# Patient Record
Sex: Female | Born: 1945
Health system: Southern US, Community
[De-identification: ages and names within clinical notes are randomized; demographics above are authoritative.]

## PROBLEM LIST (undated history)

## (undated) DIAGNOSIS — G43909 Migraine, unspecified, not intractable, without status migrainosus: Secondary | ICD-10-CM

## (undated) DIAGNOSIS — R011 Cardiac murmur, unspecified: Secondary | ICD-10-CM

## (undated) DIAGNOSIS — C4491 Basal cell carcinoma of skin, unspecified: Secondary | ICD-10-CM

## (undated) DIAGNOSIS — L409 Psoriasis, unspecified: Secondary | ICD-10-CM

## (undated) DIAGNOSIS — R112 Nausea with vomiting, unspecified: Secondary | ICD-10-CM

## (undated) DIAGNOSIS — C439 Malignant melanoma of skin, unspecified: Secondary | ICD-10-CM

## (undated) DIAGNOSIS — K649 Unspecified hemorrhoids: Secondary | ICD-10-CM

## (undated) DIAGNOSIS — Z9889 Other specified postprocedural states: Secondary | ICD-10-CM

## (undated) HISTORY — PX: REFRACTIVE SURGERY: SHX103

## (undated) HISTORY — PX: OTHER SURGICAL HISTORY: SHX169

## (undated) HISTORY — DX: Unspecified hemorrhoids: K64.9

## (undated) HISTORY — DX: Nausea with vomiting, unspecified: Z98.890

## (undated) HISTORY — DX: Psoriasis, unspecified: L40.9

## (undated) HISTORY — PX: WISDOM TOOTH EXTRACTION: SHX21

## (undated) HISTORY — DX: Nausea with vomiting, unspecified: R11.2

## (undated) HISTORY — DX: Basal cell carcinoma of skin, unspecified: C44.91

## (undated) HISTORY — DX: Cardiac murmur, unspecified: R01.1

## (undated) HISTORY — DX: Malignant melanoma of skin, unspecified: C43.9

## (undated) HISTORY — DX: Migraine, unspecified, not intractable, without status migrainosus: G43.909

## (undated) HISTORY — PX: BUNIONECTOMY: SHX129

---

## 1982-12-27 HISTORY — PX: TUBAL LIGATION: SHX77

## 2002-10-04 ENCOUNTER — Other Ambulatory Visit: Admission: RE | Admit: 2002-10-04 | Discharge: 2002-10-04 | Payer: Self-pay | Admitting: Obstetrics and Gynecology

## 2003-12-19 ENCOUNTER — Other Ambulatory Visit: Admission: RE | Admit: 2003-12-19 | Discharge: 2003-12-19 | Payer: Self-pay | Admitting: Obstetrics and Gynecology

## 2003-12-28 DIAGNOSIS — C439 Malignant melanoma of skin, unspecified: Secondary | ICD-10-CM

## 2003-12-28 HISTORY — DX: Malignant melanoma of skin, unspecified: C43.9

## 2004-12-27 LAB — HM COLONOSCOPY: HM Colonoscopy: NORMAL

## 2005-01-12 ENCOUNTER — Other Ambulatory Visit: Admission: RE | Admit: 2005-01-12 | Discharge: 2005-01-12 | Payer: Self-pay | Admitting: *Deleted

## 2010-05-26 ENCOUNTER — Ambulatory Visit (HOSPITAL_COMMUNITY): Admission: RE | Admit: 2010-05-26 | Discharge: 2010-05-26 | Payer: Self-pay | Admitting: Obstetrics and Gynecology

## 2011-03-15 LAB — CBC
RBC: 3.93 MIL/uL (ref 3.87–5.11)
WBC: 4 10*3/uL (ref 4.0–10.5)

## 2012-01-06 ENCOUNTER — Telehealth: Payer: Self-pay | Admitting: Cardiology

## 2012-01-06 DIAGNOSIS — R42 Dizziness and giddiness: Secondary | ICD-10-CM

## 2012-01-06 NOTE — Telephone Encounter (Signed)
Left message

## 2012-01-06 NOTE — Telephone Encounter (Signed)
Tried to touch base with patient to find out symptoms.  Do you want to Rx something to Chelsea Barnett or wait until I speak to her ( have left messages)

## 2012-01-06 NOTE — Telephone Encounter (Signed)
New msg Pt wants medicine for dizziness please call to [facility]

## 2012-01-06 NOTE — Telephone Encounter (Signed)
FU Call: Pt returning call from Melinda. Please call back.  

## 2012-01-07 MED ORDER — MECLIZINE HCL 25 MG PO TABS: 25.0000 mg | ORAL_TABLET | Freq: Three times a day (TID) | ORAL | Status: AC | PRN

## 2012-01-07 NOTE — Telephone Encounter (Signed)
Try to speak with her and learn more about her symptoms.  If typical for vertigo okay to call in some Meclizine

## 2012-01-07 NOTE — Telephone Encounter (Signed)
Yes go ahead and call in meclizine 25 mg one tab q 6 h prn.# 20 refillx3

## 2012-01-07 NOTE — Telephone Encounter (Signed)
Pt has a inner ear something and she wants a refill on her medication because it ran out

## 2012-01-07 NOTE — Telephone Encounter (Signed)
Have called and left another message, do you want to call something in

## 2012-04-18 ENCOUNTER — Encounter: Payer: Self-pay | Admitting: *Deleted

## 2012-04-25 LAB — HM PAP SMEAR
HM Pap smear: NEGATIVE
HM Pap smear: NEGATIVE

## 2012-05-02 ENCOUNTER — Encounter: Payer: Self-pay | Admitting: Cardiology

## 2012-05-02 DIAGNOSIS — M899 Disorder of bone, unspecified: Secondary | ICD-10-CM | POA: Diagnosis not present

## 2012-05-02 DIAGNOSIS — Z1231 Encounter for screening mammogram for malignant neoplasm of breast: Secondary | ICD-10-CM | POA: Diagnosis not present

## 2012-05-02 DIAGNOSIS — M949 Disorder of cartilage, unspecified: Secondary | ICD-10-CM | POA: Diagnosis not present

## 2012-05-02 LAB — HM MAMMOGRAPHY: HM Mammogram: NEGATIVE

## 2012-05-23 ENCOUNTER — Other Ambulatory Visit: Payer: Self-pay | Admitting: Dermatology

## 2012-05-23 DIAGNOSIS — D485 Neoplasm of uncertain behavior of skin: Secondary | ICD-10-CM | POA: Diagnosis not present

## 2012-05-23 DIAGNOSIS — L578 Other skin changes due to chronic exposure to nonionizing radiation: Secondary | ICD-10-CM | POA: Diagnosis not present

## 2012-05-23 DIAGNOSIS — Z79899 Other long term (current) drug therapy: Secondary | ICD-10-CM | POA: Diagnosis not present

## 2012-05-23 DIAGNOSIS — L408 Other psoriasis: Secondary | ICD-10-CM | POA: Diagnosis not present

## 2012-05-23 DIAGNOSIS — L570 Actinic keratosis: Secondary | ICD-10-CM | POA: Diagnosis not present

## 2012-06-01 ENCOUNTER — Encounter: Payer: Self-pay | Admitting: Cardiology

## 2012-06-01 ENCOUNTER — Ambulatory Visit (INDEPENDENT_AMBULATORY_CARE_PROVIDER_SITE_OTHER): Payer: Medicare Other | Admitting: Cardiology

## 2012-06-01 VITALS — BP 118/76 | HR 56 | Ht 64.0 in | Wt 128.0 lb

## 2012-06-01 DIAGNOSIS — R634 Abnormal weight loss: Secondary | ICD-10-CM | POA: Diagnosis not present

## 2012-06-01 DIAGNOSIS — R0602 Shortness of breath: Secondary | ICD-10-CM | POA: Diagnosis not present

## 2012-06-01 DIAGNOSIS — E7800 Pure hypercholesterolemia, unspecified: Secondary | ICD-10-CM | POA: Diagnosis not present

## 2012-06-01 DIAGNOSIS — R609 Edema, unspecified: Secondary | ICD-10-CM | POA: Insufficient documentation

## 2012-06-01 DIAGNOSIS — L409 Psoriasis, unspecified: Secondary | ICD-10-CM | POA: Insufficient documentation

## 2012-06-01 DIAGNOSIS — L408 Other psoriasis: Secondary | ICD-10-CM

## 2012-06-01 NOTE — Assessment & Plan Note (Signed)
The patient has a past history of hypercholesterolemia with elevated LDL.  We are going to check her lipids today

## 2012-06-01 NOTE — Progress Notes (Signed)
Chelsea Barnett Date of Birth:  11/21/1946 [facility] [address] [place], [place]  [address] [phone]  Fax   [phone]  HPI: This pleasant 66 year old woman is seen for a office visit.  She was last seen in 2011.  She has been basically very healthy.  She does have a few concerns.  She has had some problems with her legs.  Her gynecologist suggested that she get some venous Dopplers because of varicose vein problems in the right leg with mild right leg swelling.  She has not had any acute pain to suggest deep vein thrombosis.  She has not been having any respiratory symptoms of pulmonary embolus.  The patient does have a history of psoriasis for the past 15 years and has been on methotrexate which is monitored by Dr. Homero Fellers used in her dermatologist.  The patient has a history of ovarian problems and had her ovaries removed 2 years ago.  Recently several months ago the patient went on a diet and lost 18 pounds on the GM diet.  She is no longer dieting but the weight has not come back on.  Current Outpatient Prescriptions  Medication Sig Dispense Refill  . aspirin 81 MG tablet Take 81 mg by mouth daily.      . methotrexate (RHEUMATREX) 2.5 MG tablet Take 2.5 mg by mouth as needed. Caution:Chemotherapy. Protect from light.      . Multiple Vitamins-Minerals (MULTIVITAMIN WITH MINERALS) tablet Take 1 tablet by mouth daily.      . Vitamin D, Ergocalciferol, (DRISDOL) 50000 UNITS CAPS Take 50,000 Units by mouth every 7 (seven) days.        No Known Allergies  Patient Active Problem List  Diagnoses  . Pure hypercholesterolemia  . Edema  . Weight loss    History  Smoking status  . Never Smoker   Smokeless tobacco  . Not on file    History  Alcohol Use No    Family History  Problem Relation Age of Onset  . Heart failure Father     Review of Systems: The patient denies any heat or cold intolerance.  No weight gain or weight loss.  The patient  denies headaches or blurry vision.  There is no cough or sputum production.  The patient denies dizziness.  There is no hematuria or hematochezia.  The patient denies any muscle aches or arthritis.  The patient denies any rash.  The patient denies frequent falling or instability.  There is no history of depression or anxiety.  All other systems were reviewed and are negative.   Physical Exam: Filed Vitals:   06/01/12 1436  BP: 118/76  Pulse: 56   the general appearance reveals a well-developed well-nourished woman in no distress.The head and neck exam reveals pupils equal and reactive.  Extraocular movements are full.  There is no scleral icterus.  The mouth and pharynx are normal.  The neck is supple.  The carotids reveal no bruits.  The jugular venous pressure is normal.  The  thyroid is not enlarged.  There is no lymphadenopathy.  The chest is clear to percussion and auscultation.  There are no rales or rhonchi.  Expansion of the chest is symmetrical.  The precordium is quiet.  The first heart sound is normal.  The second heart sound is physiologically split.  There is no murmur gallop rub or click.  There is no abnormal lift or heave.  The abdomen is soft and nontender.  The bowel sounds  are normal.  The liver and spleen are not enlarged.  There are no abdominal masses.  There are no abdominal bruits.  Extremities reveal good pedal pulses.  There is no phlebitis but she does have significant evidence of venous insufficiency in the right leg particularly and she does have mild right ankle edema.  There is no cyanosis or clubbing.  Strength is normal and symmetrical in all extremities.  There is no lateralizing weakness.  There are no sensory deficits.  The skin is warm and dry.    Her electrocardiogram today shows sinus bradycardia and no ischemic changes    Assessment / Plan: No new medications are prescribed.  We are checking lab work today.  We also did give him a chest x-ray.  The last x-ray we  have on file is from 3.  Rechecked in one year for followup office visit lipid panel hepatic function panel and basal metabolic panel

## 2012-06-01 NOTE — Assessment & Plan Note (Signed)
The patient has not had much regular activity.  She does have mild chronic edema of the right ankle associated with some evidence of venous insufficiency.  We will obtain venous Dopplers

## 2012-06-01 NOTE — Patient Instructions (Addendum)
Will obtain labs today and call you with the results   Your physician has requested that you have a lower or upper extremity venous duplex. This test is an ultrasound of the veins in the legs or arms. It looks at venous blood flow that carries blood from the heart to the legs or arms. Allow one hour for a Lower Venous exam. Allow thirty minutes for an Upper Venous exam. There are no restrictions or special instructions.  Will have you go to the [place] building across from [facility] between 8:30 and 4:30 for chest Xray  ur physician recommends that you continue on your current medications as directed. Please refer to the Current Medication list given to you today.  Your physician wants you to follow-up in: 1 year You will receive a reminder letter in the mail two months in advance. If you don't receive a letter, please call our office to schedule the follow-up appointment.

## 2012-06-02 LAB — BASIC METABOLIC PANEL
BUN: 10 mg/dL (ref 6–23)
Calcium: 9.6 mg/dL (ref 8.4–10.5)
GFR: 120.4 mL/min (ref >60.00)
Glucose, Bld: 77 mg/dL (ref 70–99)

## 2012-06-02 LAB — HEPATIC FUNCTION PANEL
ALT: 18 U/L (ref 0–35)
AST: 25 U/L (ref 0–37)
Alkaline Phosphatase: 65 U/L (ref 39–117)
Bilirubin, Direct: 0.1 mg/dL (ref 0.0–0.3)
Total Bilirubin: 0.9 mg/dL (ref 0.3–1.2)

## 2012-06-02 LAB — CBC WITH DIFFERENTIAL/PLATELET
Basophils Absolute: 0 10*3/uL (ref 0.0–0.1)
Eosinophils Absolute: 0 10*3/uL (ref 0.0–0.7)
Lymphocytes Relative: 29 % (ref 12.0–46.0)
MCHC: 32.6 g/dL (ref 30.0–36.0)
MCV: 97.4 fl (ref 78.0–100.0)
Monocytes Absolute: 0.3 10*3/uL (ref 0.1–1.0)
Neutro Abs: 2.8 10*3/uL (ref 1.4–7.7)
Neutrophils Relative %: 62.4 % (ref 43.0–77.0)
RDW: 14.7 % — ABNORMAL HIGH (ref 11.5–14.6)

## 2012-06-02 LAB — LIPID PANEL
HDL: 91 mg/dL (ref >39.00)
Triglycerides: 56 mg/dL (ref 0.0–149.0)

## 2012-06-04 NOTE — Progress Notes (Signed)
Quick Note:  Please report to patient. The recent labs are stable. Continue same medication and careful diet. Thyroid normal. Lipids satisfactory. HDL nice and high. ______

## 2012-06-06 ENCOUNTER — Telehealth: Payer: Self-pay | Admitting: *Deleted

## 2012-06-06 NOTE — Telephone Encounter (Signed)
Message copied by Burnell Blanks on Tue Jun 06, 2012  5:38 PM ------      Message from: Cassell Clement      Created: Sun Jun 04, 2012  8:36 AM       Please report to patient.  The recent labs are stable. Continue same medication and careful diet. Thyroid normal. Lipids satisfactory. HDL nice and high.

## 2012-06-06 NOTE — Telephone Encounter (Signed)
Advised of labs 

## 2012-06-12 ENCOUNTER — Encounter: Payer: Self-pay | Admitting: Cardiology

## 2012-06-19 ENCOUNTER — Encounter (INDEPENDENT_AMBULATORY_CARE_PROVIDER_SITE_OTHER): Payer: Medicare Other

## 2012-06-19 DIAGNOSIS — R609 Edema, unspecified: Secondary | ICD-10-CM | POA: Diagnosis not present

## 2012-10-13 DIAGNOSIS — Z85820 Personal history of malignant melanoma of skin: Secondary | ICD-10-CM | POA: Diagnosis not present

## 2012-10-13 DIAGNOSIS — L578 Other skin changes due to chronic exposure to nonionizing radiation: Secondary | ICD-10-CM | POA: Diagnosis not present

## 2012-10-13 DIAGNOSIS — L408 Other psoriasis: Secondary | ICD-10-CM | POA: Diagnosis not present

## 2012-10-13 DIAGNOSIS — Z79899 Other long term (current) drug therapy: Secondary | ICD-10-CM | POA: Diagnosis not present

## 2012-10-13 DIAGNOSIS — L819 Disorder of pigmentation, unspecified: Secondary | ICD-10-CM | POA: Diagnosis not present

## 2013-01-03 ENCOUNTER — Encounter: Payer: Self-pay | Admitting: Internal Medicine

## 2013-01-15 DIAGNOSIS — D239 Other benign neoplasm of skin, unspecified: Secondary | ICD-10-CM | POA: Diagnosis not present

## 2013-01-15 DIAGNOSIS — Z85820 Personal history of malignant melanoma of skin: Secondary | ICD-10-CM | POA: Diagnosis not present

## 2013-01-15 DIAGNOSIS — L578 Other skin changes due to chronic exposure to nonionizing radiation: Secondary | ICD-10-CM | POA: Diagnosis not present

## 2013-01-15 DIAGNOSIS — Z79899 Other long term (current) drug therapy: Secondary | ICD-10-CM | POA: Diagnosis not present

## 2013-01-15 DIAGNOSIS — L408 Other psoriasis: Secondary | ICD-10-CM | POA: Diagnosis not present

## 2013-01-15 DIAGNOSIS — L819 Disorder of pigmentation, unspecified: Secondary | ICD-10-CM | POA: Diagnosis not present

## 2013-01-15 DIAGNOSIS — L821 Other seborrheic keratosis: Secondary | ICD-10-CM | POA: Diagnosis not present

## 2013-04-16 DIAGNOSIS — Z85828 Personal history of other malignant neoplasm of skin: Secondary | ICD-10-CM | POA: Diagnosis not present

## 2013-04-16 DIAGNOSIS — L578 Other skin changes due to chronic exposure to nonionizing radiation: Secondary | ICD-10-CM | POA: Diagnosis not present

## 2013-04-16 DIAGNOSIS — L819 Disorder of pigmentation, unspecified: Secondary | ICD-10-CM | POA: Diagnosis not present

## 2013-04-16 DIAGNOSIS — Z85820 Personal history of malignant melanoma of skin: Secondary | ICD-10-CM | POA: Diagnosis not present

## 2013-04-16 DIAGNOSIS — L408 Other psoriasis: Secondary | ICD-10-CM | POA: Diagnosis not present

## 2013-04-16 DIAGNOSIS — L570 Actinic keratosis: Secondary | ICD-10-CM | POA: Diagnosis not present

## 2013-04-16 DIAGNOSIS — Z79899 Other long term (current) drug therapy: Secondary | ICD-10-CM | POA: Diagnosis not present

## 2013-04-23 ENCOUNTER — Encounter: Payer: Self-pay | Admitting: Certified Nurse Midwife

## 2013-04-26 ENCOUNTER — Encounter: Payer: Self-pay | Admitting: Certified Nurse Midwife

## 2013-04-26 ENCOUNTER — Ambulatory Visit (INDEPENDENT_AMBULATORY_CARE_PROVIDER_SITE_OTHER): Payer: Medicare Other | Admitting: Certified Nurse Midwife

## 2013-04-26 VITALS — BP 102/60 | Ht 64.25 in | Wt 128.0 lb

## 2013-04-26 DIAGNOSIS — Z01419 Encounter for gynecological examination (general) (routine) without abnormal findings: Secondary | ICD-10-CM | POA: Diagnosis not present

## 2013-04-26 DIAGNOSIS — Z124 Encounter for screening for malignant neoplasm of cervix: Secondary | ICD-10-CM

## 2013-04-26 DIAGNOSIS — N63 Unspecified lump in unspecified breast: Secondary | ICD-10-CM

## 2013-04-26 NOTE — Patient Instructions (Signed)

## 2013-04-26 NOTE — Progress Notes (Signed)
67 y.o. [id number] Married Caucasian Fe here for annual exam. Menopausal, denies any vaginal bleeding, or vaginal dryness.  No health issues today.  Sees PCP for aex , labs and medication management. Daughter Kirt Boys) passed bar exam!  Patient's last menstrual period was 12/28/1999.          Sexually active: yes  The current method of family planning is tubal ligation.    Exercising: yes  Home exercise routine includes walking & treadmill. Smoker:  no  Health Maintenance: Pap:  03-3012 neg HPV HR neg MMG:  05-02-12 neg Colonoscopy:  2006 BMD:   05-01-10 TDaP:  Not sure per patient Labs: none done  Self breast exams: occ   reports that she has never smoked. She does not have any smokeless tobacco history on file. She reports that she does not drink alcohol or use illicit drugs.  Past Medical History  Diagnosis Date  . Psoriasis   . Melanoma 2005  . Basal cell cancer   . Vaginal delivery     times 5  . Migraines     with aura    Past Surgical History  Procedure Laterality Date  . Melanoma removal      left upper arm  . Basal cell ca removal    . Fallopian tube removal      laparoscopic bso- fibroma  . Ovaries removed      laparoscopic bso- fibroma  . Refractive surgery    . Bunionectomy Right   . Tubal ligation  1984    Current Outpatient Prescriptions  Medication Sig Dispense Refill  . FOLIC ACID PO Take by mouth. occ      . methotrexate (RHEUMATREX) 2.5 MG tablet Take 2.5 mg by mouth as needed. Caution:Chemotherapy. Protect from light.      . Multiple Vitamins-Minerals (MULTIVITAMIN WITH MINERALS) tablet Take 1 tablet by mouth daily.      . Vitamin D, Ergocalciferol, (DRISDOL) 50000 UNITS CAPS Take 50,000 Units by mouth every 14 (fourteen) days.       Marland Kitchen aspirin 81 MG tablet Take 81 mg by mouth daily.       No current facility-administered medications for this visit.    Family History  Problem Relation Age of Onset  . Heart failure Father   . Heart disease Father    CVD  . Melanoma Mother   . Hypertension Mother   . Heart disease Mother     CVD  . Depression Sister   . Melanoma Sister   . Heart disease Brother   . Heart disease Sister     ROS:  Pertinent items are noted in HPI.  Otherwise, a comprehensive ROS was negative.  Exam:   BP 102/60  Ht 5' 4.25" (1.632 m)  Wt 128 lb (58.06 kg)  BMI 21.8 kg/m2  LMP 12/28/1999  Weight change: Height: 5' 4.25" (163.2 cm)  Ht Readings from Last 3 Encounters:  04/26/13 5' 4.25" (1.632 m)  06/01/12 5\' 4"  (1.626 m)    General appearance: alert, cooperative and appears stated age Head: Normocephalic, without obvious abnormality, atraumatic Neck: no adenopathy, supple, symmetrical, trachea midline and thyroid normal to inspection and palpation Lungs: clear to auscultation bilaterally Breasts:Left breast normal appearance, no masses or tenderness, positive findings: right breast at 10 o'clock 3 cm from nipple 1 cm mobile mass noted. no nipple discharge or skin change Heart: regular rate and rhythm Abdomen: soft, non-tender; no masses,  no organomegaly Extremities: extremities normal, atraumatic, no cyanosis or edema Skin: Skin color,  texture, turgor normal. No rashes or lesions Lymph nodes: Cervical, supraclavicular, and axillary nodes normal. No abnormal inguinal nodes palpated Neurologic: Grossly normal   Pelvic: External genitalia:  no lesions              Urethra:  normal appearing urethra with no masses, tenderness or lesions              Bartholin's and Skene's: normal                 Vagina: normal appearing vagina with normal color and discharge, no lesions              Cervix: normal              Pap taken: no Bimanual Exam:  Uterus:  normal size, contour, position, consistency, mobility, non-tender              Adnexa: normal adnexa and no mass, fullness, tenderness               Rectovaginal: Confirms               Anus:  normal sphincter tone, no lesions  A:  Well Woman with normal  exam  Menopausal no HRT  Right breast mass  P: Reviewed health and wellness pertinent to exam    Pap smear as per guidelines  Mammogram yearly Reviewed findings of right breast and need for diagnostic mammogram and possible u/s agreeable will order  return annually or prn  An After Visit Summary was printed and given to the patient.  Reviewed, TL

## 2013-04-27 ENCOUNTER — Telehealth: Payer: Self-pay | Admitting: *Deleted

## 2013-04-27 NOTE — Telephone Encounter (Signed)
Patient notified of need for diagnostic mammogram scheduled at [facility] for May 8th, 2014 @ 8:15am. Chart put on hold till report back for D,. Leonard,CNM

## 2013-05-03 DIAGNOSIS — N63 Unspecified lump in unspecified breast: Secondary | ICD-10-CM | POA: Diagnosis not present

## 2013-05-09 ENCOUNTER — Telehealth: Payer: Self-pay

## 2013-05-09 NOTE — Telephone Encounter (Signed)
Per Chelsea Barnett & Chelsea Barnett, patient needs a breast recheck in (around 8/14). Patients appt needs to be with Chelsea Barnett but it needs to be scheduled on a day that Chelsea Barnett is here so she can check it also. Schedule with DL. See mammogram result  Left message for patient to callback

## 2013-05-15 ENCOUNTER — Telehealth: Payer: Self-pay | Admitting: *Deleted

## 2013-05-15 NOTE — Telephone Encounter (Signed)
Patient notified of need to have follow up recheck of breast with Ortencia Kick in 3 month with Dr. Farrel Gobble here on that day also. Scheduled for 08/20/2013 @ 10:30 am. Chart on your desk. sue

## 2013-05-15 NOTE — Telephone Encounter (Signed)
Patient notifed per sue in triage & appt scheduled

## 2013-05-16 NOTE — Telephone Encounter (Signed)
Mammogram report on your desk to sign out of hold. Thanks sue

## 2013-05-16 NOTE — Telephone Encounter (Signed)
I don't think it needs to be in hold

## 2013-05-24 ENCOUNTER — Encounter: Payer: Self-pay | Admitting: Cardiology

## 2013-07-26 ENCOUNTER — Telehealth: Payer: Self-pay | Admitting: *Deleted

## 2013-07-26 NOTE — Telephone Encounter (Signed)
Faxed refill request received from pharmacy for VITAMIN D [id number] Last filled by MD on 04/25/12 X 1 YEAR Last AEX - 04/26/13 Next AEX - 05/02/14 Please advise refills. Thanks.

## 2013-07-26 NOTE — Telephone Encounter (Signed)
Chart in your office  

## 2013-07-26 NOTE — Telephone Encounter (Signed)
Need chart with last level

## 2013-07-26 NOTE — Telephone Encounter (Signed)
Notify patient her levels have been good and the recommendation now is OTC Vitamin D 3 1000 IU daily So no refill needed

## 2013-08-10 NOTE — Telephone Encounter (Signed)
Message left to return call.

## 2013-08-20 ENCOUNTER — Ambulatory Visit: Payer: Medicare Other | Admitting: Certified Nurse Midwife

## 2013-08-21 ENCOUNTER — Ambulatory Visit: Payer: Medicare Other | Admitting: Certified Nurse Midwife

## 2013-08-22 ENCOUNTER — Encounter: Payer: Self-pay | Admitting: Internal Medicine

## 2013-08-23 ENCOUNTER — Ambulatory Visit (INDEPENDENT_AMBULATORY_CARE_PROVIDER_SITE_OTHER): Payer: Medicare Other

## 2013-08-23 DIAGNOSIS — E7800 Pure hypercholesterolemia, unspecified: Secondary | ICD-10-CM

## 2013-08-24 DIAGNOSIS — Z79899 Other long term (current) drug therapy: Secondary | ICD-10-CM | POA: Diagnosis not present

## 2013-08-24 DIAGNOSIS — Z85820 Personal history of malignant melanoma of skin: Secondary | ICD-10-CM | POA: Diagnosis not present

## 2013-08-24 DIAGNOSIS — L821 Other seborrheic keratosis: Secondary | ICD-10-CM | POA: Diagnosis not present

## 2013-08-24 DIAGNOSIS — L408 Other psoriasis: Secondary | ICD-10-CM | POA: Diagnosis not present

## 2013-08-24 DIAGNOSIS — Z85828 Personal history of other malignant neoplasm of skin: Secondary | ICD-10-CM | POA: Diagnosis not present

## 2013-08-24 LAB — HEPATIC FUNCTION PANEL
AST: 22 U/L (ref 0–37)
Albumin: 4.1 g/dL (ref 3.5–5.2)
Total Bilirubin: 0.8 mg/dL (ref 0.3–1.2)

## 2013-08-24 LAB — LIPID PANEL
Cholesterol: 220 mg/dL — ABNORMAL HIGH (ref 0–200)
HDL: 98 mg/dL (ref >39.00)
Total CHOL/HDL Ratio: 2
Triglycerides: 46 mg/dL (ref 0.0–149.0)
VLDL: 9.2 mg/dL (ref 0.0–40.0)

## 2013-08-24 LAB — BASIC METABOLIC PANEL
BUN: 16 mg/dL (ref 6–23)
Calcium: 9.2 mg/dL (ref 8.4–10.5)
Chloride: 103 mEq/L (ref 96–112)
Creatinine, Ser: 0.6 mg/dL (ref 0.4–1.2)
GFR: 106.22 mL/min (ref >60.00)

## 2013-08-24 LAB — LDL CHOLESTEROL, DIRECT: Direct LDL: 112.2 mg/dL

## 2013-08-27 NOTE — Progress Notes (Signed)
Quick Note:  Please make copy of labs for patient visit. ______ 

## 2013-08-29 ENCOUNTER — Encounter: Payer: Self-pay | Admitting: Cardiology

## 2013-08-29 ENCOUNTER — Ambulatory Visit (INDEPENDENT_AMBULATORY_CARE_PROVIDER_SITE_OTHER): Payer: Medicare Other | Admitting: Cardiology

## 2013-08-29 VITALS — BP 112/78 | HR 71 | Ht 64.25 in | Wt 133.0 lb

## 2013-08-29 DIAGNOSIS — E7800 Pure hypercholesterolemia, unspecified: Secondary | ICD-10-CM | POA: Diagnosis not present

## 2013-08-29 DIAGNOSIS — R011 Cardiac murmur, unspecified: Secondary | ICD-10-CM | POA: Diagnosis not present

## 2013-08-29 NOTE — Progress Notes (Signed)
Chelsea Barnett Date of Birth:  February 15, 1946 [facility] [address] [place], [place]  [address] [phone]  Fax   [phone]  HPI: This pleasant 67 year old woman is seen for a office visit. She was last seen in 2013. She has been basically very healthy.  She does have a history of psoriasis.  Her former dermatologist Dr. Londell Moh has retired and she has a new dermatologist who has adjusted the dose of her methotrexate.  The patient has a history of varicose veins and venous insufficiency of the right leg with intermittent dependent edema.  She does not have any prior history of a known heart murmur.  She has a family history of atrial fibrillation in a sister and also her mother. The patient is physically active and walks regularly for exercise.   Current Outpatient Prescriptions  Medication Sig Dispense Refill  . aspirin 81 MG tablet Take 81 mg by mouth daily.      Marland Kitchen FOLIC ACID PO Take by mouth. occ      . methotrexate (RHEUMATREX) 2.5 MG tablet Take 2.5 mg by mouth once a week. Caution:Chemotherapy. Protect from light.      . Multiple Vitamins-Minerals (MULTIVITAMIN WITH MINERALS) tablet Take 1 tablet by mouth daily.      . Vitamin D, Ergocalciferol, (DRISDOL) 50000 UNITS CAPS Take 50,000 Units by mouth every 14 (fourteen) days.        No current facility-administered medications for this visit.    No Known Allergies  Patient Active Problem List   Diagnosis Date Noted  . Heart murmur, systolic 08/29/2013  . Pure hypercholesterolemia 06/01/2012  . Edema 06/01/2012  . Weight loss 06/01/2012  . Psoriasis 06/01/2012    History  Smoking status  . Never Smoker   Smokeless tobacco  . Not on file    History  Alcohol Use No    Family History  Problem Relation Age of Onset  . Heart failure Father   . Heart disease Father     CVD  . Melanoma Mother   . Hypertension Mother   . Heart disease Mother     CVD  . Depression Sister   . Melanoma  Sister   . Heart disease Brother   . Heart disease Sister     Review of Systems: The patient denies any heat or cold intolerance.  No weight gain or weight loss.  The patient denies headaches or blurry vision.  There is no cough or sputum production.  The patient denies dizziness.  There is no hematuria or hematochezia.  The patient denies any muscle aches or arthritis.  The patient denies any rash.  The patient denies frequent falling or instability.  There is no history of depression or anxiety.  All other systems were reviewed and are negative.   Physical Exam: Filed Vitals:   08/29/13 1500  BP: 112/78  Pulse: 71   the general appearance reveals a well-developed well-nourished middle-aged woman in no distress.The head and neck exam reveals pupils equal and reactive.  Extraocular movements are full.  There is no scleral icterus.  The mouth and pharynx are normal.  The neck is supple.  The carotids reveal no bruits.  The jugular venous pressure is normal.  The  thyroid is not enlarged.  There is no lymphadenopathy.  The chest is clear to percussion and auscultation.  There are no rales or rhonchi.  Expansion of the chest is symmetrical.  The precordium is quiet.  The first heart sound is  normal.  The second heart sound is physiologically split.  There is grade 2/6 systolic ejection murmur at the base.  There is no abnormal lift or heave.  The abdomen is soft and nontender.  The bowel sounds are normal.  The liver and spleen are not enlarged.  There are no abdominal masses.  There are no abdominal bruits.  Extremities reveal good pedal pulses.  There is no phlebitis or edema.  There is no cyanosis or clubbing.  Strength is normal and symmetrical in all extremities.  There is no lateralizing weakness.  There are no sensory deficits.  The skin is warm and dry.  There is no rash.      Assessment / Plan:   We will obtain a two-dimensional echocardiogram.  Return in one year for office visit EKG lipid  panel hepatic function panel and basal metabolic panel

## 2013-08-29 NOTE — Assessment & Plan Note (Signed)
Examination today reveals a soft systolic ejection murmur at the base suggesting an aortic outflow murmur.  We will obtain a two-dimensional echo

## 2013-08-29 NOTE — Patient Instructions (Signed)
Your physician recommends that you continue on your current medications as directed. Please refer to the Current Medication list given to you today.  Your physician wants you to follow-up in: 1 year ov/ekg/lp/bmet/hfp You will receive a reminder letter in the mail two months in advance. If you don't receive a letter, please call our office to schedule the follow-up appointment.  Your physician has requested that you have an echocardiogram. Echocardiography is a painless test that uses sound waves to create images of your heart. It provides your doctor with information about the size and shape of your heart and how well your heart's chambers and valves are working. This procedure takes approximately one hour. There are no restrictions for this procedure.

## 2013-08-29 NOTE — Assessment & Plan Note (Signed)
The patient has a history of high total cholesterol but her HDL level is extremely high and her risk ratio is 2.  She does not have to be on any statin therapy

## 2013-09-04 ENCOUNTER — Ambulatory Visit (HOSPITAL_COMMUNITY): Payer: Medicare Other | Attending: Cardiovascular Disease | Admitting: Radiology

## 2013-09-04 DIAGNOSIS — E785 Hyperlipidemia, unspecified: Secondary | ICD-10-CM | POA: Insufficient documentation

## 2013-09-04 DIAGNOSIS — R011 Cardiac murmur, unspecified: Secondary | ICD-10-CM

## 2013-09-04 DIAGNOSIS — I080 Rheumatic disorders of both mitral and aortic valves: Secondary | ICD-10-CM | POA: Insufficient documentation

## 2013-09-04 DIAGNOSIS — I079 Rheumatic tricuspid valve disease, unspecified: Secondary | ICD-10-CM | POA: Diagnosis not present

## 2013-09-04 NOTE — Progress Notes (Signed)
Echocardiogram performed.  

## 2013-09-10 ENCOUNTER — Telehealth: Payer: Self-pay | Admitting: *Deleted

## 2013-09-10 NOTE — Telephone Encounter (Signed)
Message copied by Burnell Blanks on Mon Sep 10, 2013  6:09 PM ------      Message from: Cassell Clement      Created: Wed Sep 05, 2013  5:31 AM       Please report.  Echo shows normal systolic function. Mild Aortic insufficiency and mild mitral insufficiency are the cause of her soft heart murmur. No drug treatment needed.  Continue to exercise and keep BP normal. ------

## 2013-09-10 NOTE — Telephone Encounter (Signed)
Advised patient

## 2013-11-19 ENCOUNTER — Ambulatory Visit (INDEPENDENT_AMBULATORY_CARE_PROVIDER_SITE_OTHER): Payer: Medicare Other | Admitting: Cardiology

## 2013-11-19 ENCOUNTER — Telehealth: Payer: Self-pay | Admitting: Cardiology

## 2013-11-19 ENCOUNTER — Encounter: Payer: Self-pay | Admitting: Cardiology

## 2013-11-19 VITALS — BP 142/80 | HR 69 | Ht 64.0 in | Wt 135.0 lb

## 2013-11-19 DIAGNOSIS — R609 Edema, unspecified: Secondary | ICD-10-CM | POA: Diagnosis not present

## 2013-11-19 DIAGNOSIS — R071 Chest pain on breathing: Secondary | ICD-10-CM | POA: Diagnosis not present

## 2013-11-19 NOTE — Telephone Encounter (Signed)
Follow up    Pt calling back about left back pain this started to 2 hrs ago.

## 2013-11-19 NOTE — Assessment & Plan Note (Signed)
The patient is not experiencing any significant peripheral edema

## 2013-11-19 NOTE — Assessment & Plan Note (Signed)
The patient presents with mild to moderate left-sided posterior chest discomfort worse on taking a deep breath.  She is not significantly dyspneic.  Her daughter thought she looked pale today.  She is not having any fever chills or night sweats or constitutional symptoms.  She has not had any recent swelling or pain in her legs. I suspect that this may be musculoskeletal chest wall pain but in view of her recent air travel we will get a CBC, basal metabolic panel, and a d-dimer.  It was too late in the afternoon to get a chest x-ray today so we will get one first thing in the morning.  In the meantime she will use ibuprofen on a when necessary basis.

## 2013-11-19 NOTE — Telephone Encounter (Signed)
Spoke with patient who c/o pain in her back under her left shoulder blade onset 2 hours ago.  Patient denies that pain radiates; denies jaw or arm pain.  Patient states the pain is worse when she takes a deep breath.  Patient denies SOB, nausea, fever, chills, coughing, sore throat, or ear pain.  Patient states she ate a greek salad for lunch, which is what she usually eats.  Patient questioned whether pain could be gas and I advised her to take a Tums and I will discuss with Dr. Patty Sermons who is in the office today. Patient denies that she has any questions about her medications.  Patient verbalized understanding and agreement with plan. I reviewed patient c/o with Dr. Patty Sermons whose advice is for patient to come here for an ekg and for an office visit.  I called patient to tell her his advice.  Patient verbalized agreement and understanding and is en route to the office.

## 2013-11-19 NOTE — Telephone Encounter (Signed)
New message     Question about medications

## 2013-11-19 NOTE — Progress Notes (Signed)
Chelsea Barnett Date of Birth:  1946/01/30 [address] [place], [place]  [address] [phone]  Fax   [phone]  HPI: This pleasant 67 year old woman is seen as a work in the office visit.  She comes in because of some left posterior pleuritic chest discomfort which started today.  There is no history of trauma.  2 weeks ago she had a long airplane trip to [place]. She has been basically very healthy.   The patient has a history of varicose veins and venous insufficiency of the right leg with intermittent dependent edema.  She does not have any prior history of a known heart murmur.  She had an echocardiogram in 09/04/13 which showed normal systolic function with ejection fraction 50-55% and grade 1 diastolic dysfunction.  There was mild aortic insufficiency and mild mitral regurgitation. She has a family history of atrial fibrillation in a sister and also her mother. The patient is physically active and walks regularly for exercise.   Current Outpatient Prescriptions  Medication Sig Dispense Refill  . aspirin 81 MG tablet Take 81 mg by mouth daily.      Marland Kitchen FOLIC ACID PO Take by mouth. occ      . methotrexate (RHEUMATREX) 2.5 MG tablet Take 6 tabs 1 day during the week -Pt takes 2 tabs three times a day on that one day      . Multiple Vitamins-Minerals (MULTIVITAMIN WITH MINERALS) tablet Take 1 tablet by mouth daily.      . Vitamin D, Ergocalciferol, (DRISDOL) 50000 UNITS CAPS Take 50,000 Units by mouth every 14 (fourteen) days.        No current facility-administered medications for this visit.    No Known Allergies  Patient Active Problem List   Diagnosis Date Noted  . Chest pain on breathing 11/19/2013  . Heart murmur, systolic 08/29/2013  . Pure hypercholesterolemia 06/01/2012  . Edema 06/01/2012  . Weight loss 06/01/2012  . Psoriasis 06/01/2012    History  Smoking status  . Never Smoker   Smokeless tobacco  . Not on file    History    Alcohol Use No    Family History  Problem Relation Age of Onset  . Heart failure Father   . Heart disease Father     CVD  . Melanoma Mother   . Hypertension Mother   . Heart disease Mother     CVD  . Depression Sister   . Melanoma Sister   . Heart disease Brother   . Heart disease Sister     Review of Systems: The patient denies any heat or cold intolerance.  No weight gain or weight loss.  The patient denies headaches or blurry vision.  There is no cough or sputum production.  The patient denies dizziness.  There is no hematuria or hematochezia.  The patient denies any muscle aches or arthritis.  The patient denies any rash.  The patient denies frequent falling or instability.  There is no history of depression or anxiety.  All other systems were reviewed and are negative.   Physical Exam: Filed Vitals:   11/19/13 1652  BP: 142/80  Pulse:    the general appearance reveals a well-developed well-nourished middle-aged woman in no distress.The head and neck exam reveals pupils equal and reactive.  Extraocular movements are full.  There is no scleral icterus.  The mouth and pharynx are normal.  The neck is supple.  The carotids reveal no bruits.  The jugular venous  pressure is normal.  The  thyroid is not enlarged.  There is no lymphadenopathy.  The chest is clear to percussion and auscultation.  There are no rales or rhonchi.  Expansion of the chest is symmetrical.  The precordium is quiet.  The first heart sound is normal.  The second heart sound is physiologically split.  There is grade 2/6 systolic ejection murmur at the base.  There is no abnormal lift or heave.  The abdomen is soft and nontender.  The bowel sounds are normal.  The liver and spleen are not enlarged.  There are no abdominal masses.  There are no abdominal bruits.  Extremities reveal good pedal pulses.  There is no phlebitis or edema.  There is no cyanosis or clubbing.  Strength is normal and symmetrical in all  extremities.  There is no lateralizing weakness.  There are no sensory deficits.  The skin is warm and dry.  There is no rash.  EKG today shows normal sinus rhythm with low-voltage QRS.  No significant change since 06/01/12    Assessment / Plan:  Suspect that this may be musculoskeletal chest wall pain with pleuritic component.  We will get baseline labs including a d-dimer today.  She will get a chest x-ray tomorrow morning since it is too late to get one today.  Recheck on a when necessary basis if symptoms fail to resolve.

## 2013-11-19 NOTE — Patient Instructions (Signed)
Will obtain labs today and call you with the results (cbc/bmet/d-dimer)  Would like for you to go to [facility] across from [facility] first thing in the morning (after 8:30) for chest xray  Call back if symptoms no better

## 2013-11-20 ENCOUNTER — Telehealth: Payer: Self-pay | Admitting: *Deleted

## 2013-11-20 ENCOUNTER — Ambulatory Visit (INDEPENDENT_AMBULATORY_CARE_PROVIDER_SITE_OTHER)
Admission: RE | Admit: 2013-11-20 | Discharge: 2013-11-20 | Disposition: A | Discharge status: Home / Self Care | Payer: Medicare Other | Source: Ambulatory Visit | Attending: Cardiology | Admitting: Cardiology

## 2013-11-20 DIAGNOSIS — R079 Chest pain, unspecified: Secondary | ICD-10-CM | POA: Diagnosis not present

## 2013-11-20 DIAGNOSIS — R071 Chest pain on breathing: Secondary | ICD-10-CM

## 2013-11-20 LAB — BASIC METABOLIC PANEL
Calcium: 9.8 mg/dL (ref 8.4–10.5)
Creatinine, Ser: 0.6 mg/dL (ref 0.4–1.2)
GFR: 106.14 mL/min (ref >60.00)
Sodium: 140 mEq/L (ref 135–145)

## 2013-11-20 LAB — CBC WITH DIFFERENTIAL/PLATELET
Basophils Relative: 0.5 % (ref 0.0–3.0)
Eosinophils Absolute: 0 10*3/uL (ref 0.0–0.7)
Hemoglobin: 11.9 g/dL — ABNORMAL LOW (ref 12.0–15.0)
Lymphocytes Relative: 13.2 % (ref 12.0–46.0)
MCHC: 33.3 g/dL (ref 30.0–36.0)
Monocytes Relative: 6.6 % (ref 3.0–12.0)
Neutro Abs: 6.5 10*3/uL (ref 1.4–7.7)
Neutrophils Relative %: 79.2 % — ABNORMAL HIGH (ref 43.0–77.0)
RBC: 3.96 Mil/uL (ref 3.87–5.11)
WBC: 8.3 10*3/uL (ref 4.5–10.5)

## 2013-11-20 NOTE — Telephone Encounter (Signed)
Message copied by Burnell Blanks on Tue Nov 20, 2013  3:12 PM ------      Message from: Cassell Clement      Created: Tue Nov 20, 2013  2:13 PM       Chest xray normal. Please report. ------

## 2013-11-20 NOTE — Progress Notes (Signed)
Quick Note:  Please report to patient. The recent labs are stable. Continue same medication and careful diet. ______ 

## 2013-11-20 NOTE — Telephone Encounter (Signed)
Advised patient

## 2013-11-20 NOTE — Telephone Encounter (Signed)
Message copied by Burnell Blanks on Tue Nov 20, 2013  3:12 PM ------      Message from: Cassell Clement      Created: Tue Nov 20, 2013  2:13 PM       Please report to patient.  The recent labs are stable. Continue same medication and careful diet. ------

## 2013-12-17 DIAGNOSIS — Z85828 Personal history of other malignant neoplasm of skin: Secondary | ICD-10-CM | POA: Diagnosis not present

## 2013-12-17 DIAGNOSIS — Z85820 Personal history of malignant melanoma of skin: Secondary | ICD-10-CM | POA: Diagnosis not present

## 2013-12-17 DIAGNOSIS — Z79899 Other long term (current) drug therapy: Secondary | ICD-10-CM | POA: Diagnosis not present

## 2013-12-17 DIAGNOSIS — L408 Other psoriasis: Secondary | ICD-10-CM | POA: Diagnosis not present

## 2014-02-08 DIAGNOSIS — M542 Cervicalgia: Secondary | ICD-10-CM | POA: Diagnosis not present

## 2014-02-08 DIAGNOSIS — M546 Pain in thoracic spine: Secondary | ICD-10-CM | POA: Diagnosis not present

## 2014-03-18 ENCOUNTER — Other Ambulatory Visit: Payer: Self-pay | Admitting: *Deleted

## 2014-03-18 DIAGNOSIS — D239 Other benign neoplasm of skin, unspecified: Secondary | ICD-10-CM | POA: Diagnosis not present

## 2014-03-18 DIAGNOSIS — Z85820 Personal history of malignant melanoma of skin: Secondary | ICD-10-CM | POA: Diagnosis not present

## 2014-03-18 DIAGNOSIS — L408 Other psoriasis: Secondary | ICD-10-CM | POA: Diagnosis not present

## 2014-03-18 DIAGNOSIS — Z85828 Personal history of other malignant neoplasm of skin: Secondary | ICD-10-CM | POA: Diagnosis not present

## 2014-03-18 MED ORDER — VITAMIN D (ERGOCALCIFEROL) 1.25 MG (50000 UNIT) PO CAPS: 50000.0000 [IU] | ORAL_CAPSULE | ORAL | Status: DC

## 2014-03-18 NOTE — Telephone Encounter (Addendum)
Fax from [facility] requesting Vitamin D 50,000  Last refilled: 04/25/12 #26 with refills x 1 year Last AEX: 04/26/13 no rx was given/sent Last Vitamin D level checked: 04/25/12 at 41 AEX Scheduled: 05/02/14 with Ms. Debbie   Given Vitamin D Protocol please advise.

## 2014-03-18 NOTE — Telephone Encounter (Signed)
I refilled until aex

## 2014-04-25 DIAGNOSIS — L408 Other psoriasis: Secondary | ICD-10-CM | POA: Diagnosis not present

## 2014-04-25 DIAGNOSIS — Z85820 Personal history of malignant melanoma of skin: Secondary | ICD-10-CM | POA: Diagnosis not present

## 2014-04-25 DIAGNOSIS — Z85828 Personal history of other malignant neoplasm of skin: Secondary | ICD-10-CM | POA: Diagnosis not present

## 2014-04-30 DIAGNOSIS — L408 Other psoriasis: Secondary | ICD-10-CM | POA: Diagnosis not present

## 2014-05-02 ENCOUNTER — Encounter: Payer: Self-pay | Admitting: Certified Nurse Midwife

## 2014-05-02 ENCOUNTER — Ambulatory Visit (INDEPENDENT_AMBULATORY_CARE_PROVIDER_SITE_OTHER): Payer: Medicare Other | Admitting: Certified Nurse Midwife

## 2014-05-02 VITALS — BP 124/70 | HR 68 | Resp 16 | Ht 64.0 in | Wt 134.0 lb

## 2014-05-02 DIAGNOSIS — Z01419 Encounter for gynecological examination (general) (routine) without abnormal findings: Secondary | ICD-10-CM

## 2014-05-02 DIAGNOSIS — Z0000 Encounter for general adult medical examination without abnormal findings: Secondary | ICD-10-CM

## 2014-05-02 DIAGNOSIS — L408 Other psoriasis: Secondary | ICD-10-CM | POA: Diagnosis not present

## 2014-05-02 NOTE — Progress Notes (Signed)
Reviewed personally.  M. Suzanne Krew Hortman, MD.  

## 2014-05-02 NOTE — Patient Instructions (Signed)

## 2014-05-02 NOTE — Progress Notes (Signed)
68 y.o. [id number] Married Caucasian Fe here for annual exam. Menopausal no HRT denies vaginal bleeding. Using coconut oil for vaginal dryness. Sees PCP yearly for labs, aex. Was told this year she had MVP slight, no treatment needed per cardiologist.. No other health issues today.  Patient's last menstrual period was 12/28/1999.          Sexually active: yes  The current method of family planning is tubal ligation.    Exercising: no  exercise Smoker:  no  Health Maintenance: Pap: 04-25-12 neg HPV HR neg MMG: 05-03-13 normal Colonoscopy:  2006 Will schedule with Cristal [facility] BMD:   2011 patient has scheduled TDaP:  Unsure, will check with MD Labs: none Self breast exam: done occ   reports that she has never smoked. She does not have any smokeless tobacco history on file. She reports that she does not drink alcohol or use illicit drugs.  Past Medical History  Diagnosis Date  . Psoriasis   . Melanoma 2005  . Basal cell cancer   . Vaginal delivery     times 5  . Migraines     with aura  . Heart murmur     Past Surgical History  Procedure Laterality Date  . Melanoma removal      left upper arm  . Basal cell ca removal    . Fallopian tube removal      laparoscopic bso- fibroma  . Ovaries removed      laparoscopic bso- fibroma  . Refractive surgery    . Bunionectomy Right   . Tubal ligation  1984    Current Outpatient Prescriptions  Medication Sig Dispense Refill  . FOLIC ACID PO Take by mouth. occ      . triamcinolone cream (KENALOG) 0.1 % daily.      . Vitamin D, Ergocalciferol, (DRISDOL) 50000 UNITS CAPS capsule Take 1 capsule (50,000 Units total) by mouth every 14 (fourteen) days.  30 capsule  0   No current facility-administered medications for this visit.    Family History  Problem Relation Age of Onset  . Heart failure Father   . Heart disease Father     CVD  . Melanoma Mother   . Hypertension Mother   . Heart disease Mother     CVD  . Depression Sister   .  Melanoma Sister   . Heart disease Brother   . Heart disease Sister     ROS:  Pertinent items are noted in HPI.  Otherwise, a comprehensive ROS was negative.  Exam:   BP 124/70  Pulse 68  Resp 16  Ht 5\' 4"  (1.626 m)  Wt 134 lb (60.782 kg)  BMI 22.99 kg/m2  LMP 12/28/1999 Height: 5\' 4"  (162.6 cm)  Ht Readings from Last 3 Encounters:  05/02/14 5\' 4"  (1.626 m)  11/19/13 5\' 4"  (1.626 m)  08/29/13 5' 4.25" (1.632 m)    General appearance: alert, cooperative and appears stated age Head: Normocephalic, without obvious abnormality, atraumatic Neck: no adenopathy, supple, symmetrical, trachea midline and thyroid normal to inspection and palpation and non-palpable Lungs: clear to auscultation bilaterally Breasts: normal appearance, no masses or tenderness, No nipple retraction or dimpling, No nipple discharge or bleeding, No axillary or supraclavicular adenopathy Heart: regular rate and rhythm, slight murmur heard only twice in sitting position, gr1/1V. Abdomen: soft, non-tender; no masses,  no organomegaly Extremities: extremities normal, atraumatic, no cyanosis or edema Skin: Skin color, texture, turgor normal. No rashes or lesions Lymph nodes: Cervical, supraclavicular, and axillary  nodes normal. No abnormal inguinal nodes palpated Neurologic: Grossly normal   Pelvic: External genitalia:  no lesions              Urethra:  normal appearing urethra with no masses, tenderness or lesions              Bartholin's and Skene's: normal                 Vagina: normal appearing vagina with normal color and discharge, no lesions              Cervix: normal, non tender              Pap taken: yes Bimanual Exam:  Uterus:  normal size, contour, position, consistency, mobility, non-tender and anteverted              Adnexa: normal adnexa and no mass, fullness, tenderness               Rectovaginal: Confirms               Anus:  normal sphincter tone, no lesions  A:  Well Woman with normal  exam  Menopausal no HRT  MVP under cardiology management  P:   Reviewed health and wellness pertinent to exam  Pap smear taken today  Mammogram yearly  Colonoscopy due 2016 patient to call to schedule  counseled on breast self exam, adequate intake of calcium and vitamin D, diet and exercise  return annually or prn  An After Visit Summary was printed and given to the patient.

## 2014-05-06 LAB — IPS PAP TEST WITH REFLEX TO HPV

## 2014-05-07 DIAGNOSIS — L408 Other psoriasis: Secondary | ICD-10-CM | POA: Diagnosis not present

## 2014-05-09 DIAGNOSIS — L408 Other psoriasis: Secondary | ICD-10-CM | POA: Diagnosis not present

## 2014-05-13 DIAGNOSIS — L408 Other psoriasis: Secondary | ICD-10-CM | POA: Diagnosis not present

## 2014-05-15 DIAGNOSIS — L408 Other psoriasis: Secondary | ICD-10-CM | POA: Diagnosis not present

## 2014-05-17 DIAGNOSIS — L408 Other psoriasis: Secondary | ICD-10-CM | POA: Diagnosis not present

## 2014-05-21 DIAGNOSIS — L408 Other psoriasis: Secondary | ICD-10-CM | POA: Diagnosis not present

## 2014-05-29 DIAGNOSIS — L408 Other psoriasis: Secondary | ICD-10-CM | POA: Diagnosis not present

## 2014-05-31 DIAGNOSIS — Z85820 Personal history of malignant melanoma of skin: Secondary | ICD-10-CM | POA: Diagnosis not present

## 2014-05-31 DIAGNOSIS — Z85828 Personal history of other malignant neoplasm of skin: Secondary | ICD-10-CM | POA: Diagnosis not present

## 2014-05-31 DIAGNOSIS — L408 Other psoriasis: Secondary | ICD-10-CM | POA: Diagnosis not present

## 2014-06-03 DIAGNOSIS — L408 Other psoriasis: Secondary | ICD-10-CM | POA: Diagnosis not present

## 2014-06-05 DIAGNOSIS — L408 Other psoriasis: Secondary | ICD-10-CM | POA: Diagnosis not present

## 2014-06-07 DIAGNOSIS — L408 Other psoriasis: Secondary | ICD-10-CM | POA: Diagnosis not present

## 2014-06-10 DIAGNOSIS — L408 Other psoriasis: Secondary | ICD-10-CM | POA: Diagnosis not present

## 2014-06-12 DIAGNOSIS — L408 Other psoriasis: Secondary | ICD-10-CM | POA: Diagnosis not present

## 2014-06-14 DIAGNOSIS — L408 Other psoriasis: Secondary | ICD-10-CM | POA: Diagnosis not present

## 2014-06-17 DIAGNOSIS — L408 Other psoriasis: Secondary | ICD-10-CM | POA: Diagnosis not present

## 2014-06-19 DIAGNOSIS — L408 Other psoriasis: Secondary | ICD-10-CM | POA: Diagnosis not present

## 2014-06-21 DIAGNOSIS — L408 Other psoriasis: Secondary | ICD-10-CM | POA: Diagnosis not present

## 2014-06-25 DIAGNOSIS — L408 Other psoriasis: Secondary | ICD-10-CM | POA: Diagnosis not present

## 2014-06-27 DIAGNOSIS — L408 Other psoriasis: Secondary | ICD-10-CM | POA: Diagnosis not present

## 2014-07-01 DIAGNOSIS — L408 Other psoriasis: Secondary | ICD-10-CM | POA: Diagnosis not present

## 2014-07-03 DIAGNOSIS — L408 Other psoriasis: Secondary | ICD-10-CM | POA: Diagnosis not present

## 2014-07-05 DIAGNOSIS — L408 Other psoriasis: Secondary | ICD-10-CM | POA: Diagnosis not present

## 2014-07-10 DIAGNOSIS — L408 Other psoriasis: Secondary | ICD-10-CM | POA: Diagnosis not present

## 2014-07-12 DIAGNOSIS — L408 Other psoriasis: Secondary | ICD-10-CM | POA: Diagnosis not present

## 2014-07-15 DIAGNOSIS — L408 Other psoriasis: Secondary | ICD-10-CM | POA: Diagnosis not present

## 2014-07-22 DIAGNOSIS — L408 Other psoriasis: Secondary | ICD-10-CM | POA: Diagnosis not present

## 2014-07-24 DIAGNOSIS — L408 Other psoriasis: Secondary | ICD-10-CM | POA: Diagnosis not present

## 2014-07-26 DIAGNOSIS — L408 Other psoriasis: Secondary | ICD-10-CM | POA: Diagnosis not present

## 2014-07-29 DIAGNOSIS — L408 Other psoriasis: Secondary | ICD-10-CM | POA: Diagnosis not present

## 2014-07-31 DIAGNOSIS — L408 Other psoriasis: Secondary | ICD-10-CM | POA: Diagnosis not present

## 2014-08-12 DIAGNOSIS — L408 Other psoriasis: Secondary | ICD-10-CM | POA: Diagnosis not present

## 2014-08-14 DIAGNOSIS — L408 Other psoriasis: Secondary | ICD-10-CM | POA: Diagnosis not present

## 2014-08-16 DIAGNOSIS — L408 Other psoriasis: Secondary | ICD-10-CM | POA: Diagnosis not present

## 2014-08-19 DIAGNOSIS — Z85828 Personal history of other malignant neoplasm of skin: Secondary | ICD-10-CM | POA: Diagnosis not present

## 2014-08-19 DIAGNOSIS — D239 Other benign neoplasm of skin, unspecified: Secondary | ICD-10-CM | POA: Diagnosis not present

## 2014-08-19 DIAGNOSIS — L570 Actinic keratosis: Secondary | ICD-10-CM | POA: Diagnosis not present

## 2014-08-19 DIAGNOSIS — D1801 Hemangioma of skin and subcutaneous tissue: Secondary | ICD-10-CM | POA: Diagnosis not present

## 2014-08-19 DIAGNOSIS — Z85820 Personal history of malignant melanoma of skin: Secondary | ICD-10-CM | POA: Diagnosis not present

## 2014-08-19 DIAGNOSIS — L408 Other psoriasis: Secondary | ICD-10-CM | POA: Diagnosis not present

## 2014-08-19 DIAGNOSIS — L98499 Non-pressure chronic ulcer of skin of other sites with unspecified severity: Secondary | ICD-10-CM | POA: Diagnosis not present

## 2014-08-20 DIAGNOSIS — L408 Other psoriasis: Secondary | ICD-10-CM | POA: Diagnosis not present

## 2014-08-22 ENCOUNTER — Other Ambulatory Visit: Payer: Self-pay

## 2014-08-22 DIAGNOSIS — L408 Other psoriasis: Secondary | ICD-10-CM | POA: Diagnosis not present

## 2014-08-22 MED ORDER — VITAMIN D (ERGOCALCIFEROL) 1.25 MG (50000 UNIT) PO CAPS: 50000.0000 [IU] | ORAL_CAPSULE | ORAL | Status: DC

## 2014-08-22 NOTE — Telephone Encounter (Signed)
Fax from [facility]: 05/02/14 Last refill:03/18/14 #30 X 0 Current AEX:05/06/15 Last Vitamin D level checked: 04/25/12 at 41   Please advise

## 2014-08-26 DIAGNOSIS — Z85820 Personal history of malignant melanoma of skin: Secondary | ICD-10-CM | POA: Diagnosis not present

## 2014-08-26 DIAGNOSIS — Z85828 Personal history of other malignant neoplasm of skin: Secondary | ICD-10-CM | POA: Diagnosis not present

## 2014-08-26 DIAGNOSIS — L821 Other seborrheic keratosis: Secondary | ICD-10-CM | POA: Diagnosis not present

## 2014-08-26 DIAGNOSIS — L408 Other psoriasis: Secondary | ICD-10-CM | POA: Diagnosis not present

## 2014-08-27 DIAGNOSIS — L408 Other psoriasis: Secondary | ICD-10-CM | POA: Diagnosis not present

## 2014-08-29 DIAGNOSIS — L408 Other psoriasis: Secondary | ICD-10-CM | POA: Diagnosis not present

## 2014-09-03 DIAGNOSIS — L408 Other psoriasis: Secondary | ICD-10-CM | POA: Diagnosis not present

## 2014-09-10 DIAGNOSIS — L408 Other psoriasis: Secondary | ICD-10-CM | POA: Diagnosis not present

## 2014-09-16 DIAGNOSIS — L408 Other psoriasis: Secondary | ICD-10-CM | POA: Diagnosis not present

## 2014-09-17 ENCOUNTER — Encounter: Payer: Self-pay | Admitting: Cardiology

## 2014-09-17 ENCOUNTER — Other Ambulatory Visit: Payer: Medicare Other

## 2014-09-17 ENCOUNTER — Ambulatory Visit (INDEPENDENT_AMBULATORY_CARE_PROVIDER_SITE_OTHER): Payer: Medicare Other | Admitting: Cardiology

## 2014-09-17 VITALS — BP 132/78 | HR 58 | Ht 64.0 in | Wt 134.0 lb

## 2014-09-17 DIAGNOSIS — R609 Edema, unspecified: Secondary | ICD-10-CM

## 2014-09-17 DIAGNOSIS — Z1231 Encounter for screening mammogram for malignant neoplasm of breast: Secondary | ICD-10-CM | POA: Diagnosis not present

## 2014-09-17 DIAGNOSIS — E7800 Pure hypercholesterolemia, unspecified: Secondary | ICD-10-CM

## 2014-09-17 DIAGNOSIS — R011 Cardiac murmur, unspecified: Secondary | ICD-10-CM

## 2014-09-17 DIAGNOSIS — R071 Chest pain on breathing: Secondary | ICD-10-CM | POA: Diagnosis not present

## 2014-09-17 NOTE — Assessment & Plan Note (Signed)
No further chest discomfort.  No pleurisy.  Exercise tolerance is good.

## 2014-09-17 NOTE — Progress Notes (Signed)
[place] Date of Birth:  09/28/1946 [facility] [address] [place], [place]  [address] [phone]  Fax   [phone]  HPI:  This pleasant 68 year old woman is seen for a scheduled followup office visit.  We last saw her in November 2014 because of some left posterior pleuritic chest discomfort which started  2 weeks after she had a long airplane trip to [place]. She has been basically very healthy. The patient has a history of varicose veins and venous insufficiency of the right leg with intermittent dependent edema. She does not have any prior history of a known heart murmur. She had an echocardiogram in 09/04/13 which showed normal systolic function with ejection fraction 50-55% and grade 1 diastolic dysfunction. There was mild aortic insufficiency and mild mitral regurgitation. She has a family history of atrial fibrillation in a sister and also her mother.  In November we obtained an EKG and a chest x-ray and a d-dimer all of which were normal and there was no evidence of a pulmonary embolus.  Since then the patient has been feeling well.  She walks in her neighborhood for exercise.    Current Outpatient Prescriptions  Medication Sig Dispense Refill  . FLUOCINOLONE ACETONIDE SCALP 0.01 % OIL       . FOLIC ACID PO Take by mouth. occ      . Vitamin D, Ergocalciferol, (DRISDOL) 50000 UNITS CAPS capsule Take 1 capsule (50,000 Units total) by mouth every 14 (fourteen) days.  30 capsule  0  . triamcinolone cream (KENALOG) 0.1 % daily.       No current facility-administered medications for this visit.    No Known Allergies  Patient Active Problem List   Diagnosis Date Noted  . Chest pain on breathing 11/19/2013  . Heart murmur, systolic 79/01/4096  . Pure hypercholesterolemia 06/01/2012  . Edema 06/01/2012  . Weight loss 06/01/2012  . Psoriasis 06/01/2012    History  Smoking status  . Never Smoker   Smokeless tobacco  . Not on file     History  Alcohol Use No    Family History  Problem Relation Age of Onset  . Heart failure Father   . Heart disease Father     CVD  . Melanoma Mother   . Hypertension Mother   . Heart disease Mother     CVD  . Depression Sister   . Melanoma Sister   . Heart disease Brother   . Heart disease Sister     Review of Systems: The patient denies any heat or cold intolerance.  No weight gain or weight loss.  The patient denies headaches or blurry vision.  There is no cough or sputum production.  The patient denies dizziness.  There is no hematuria or hematochezia.  The patient denies any muscle aches or arthritis.  The patient denies any rash.  The patient denies frequent falling or instability.  There is no history of depression or anxiety.  All other systems were reviewed and are negative.   Physical Exam: Filed Vitals:   09/17/14 0852  BP: 132/78  Pulse: 58  The patient appears to be in no distress.  Head and neck exam reveals that the pupils are equal and reactive.  The extraocular movements are full.  There is no scleral icterus.  Mouth and pharynx are benign.  No lymphadenopathy.  No carotid bruits.  The jugular venous pressure is normal.  Thyroid is not enlarged or tender.  Chest  is clear to percussion and auscultation.  No rales or rhonchi.  Expansion of the chest is symmetrical.  Heart reveals no abnormal lift or heave.  First and second heart sounds are normal.  There is grade 1/6 systolic ejection murmur at the base.  The abdomen is soft and nontender.  Bowel sounds are normoactive.  There is no hepatosplenomegaly or mass.  There are no abdominal bruits.  Extremities reveal no phlebitis or edema.  Pedal pulses are good.  There is no cyanosis or clubbing.  Neurologic exam is normal strength and no lateralizing weakness.  No sensory deficits.  Integument reveals rash consistent with psoriasis.   EKG shows sinus bradycardia and no ischemic changes.  Since last  tracing 11/19/13, no significant change.  Assessment / Plan: 1. systolic heart murmur with previous echocardiogram September 2014 showing mild aortic insufficiency and mild mitral regurgitation. 2. Hypercholesterolemia 3. psoriasis followed by Dr. Amy Martinique.  Presently she is using a light box therapy and she is off methotrexate.  Disposition: Continue current medication.  Await results of today's labs.  Recheck in 6 months for office visit.  Continue walking for exercise.

## 2014-09-17 NOTE — Assessment & Plan Note (Signed)
The patient has a history of hypercholesterolemia.  She is not currently on statin.  We're getting fasting lab work today.

## 2014-09-17 NOTE — Patient Instructions (Signed)
Will obtain labs today and call you with the results (lp/bmet/hfp/cbc)  Your physician recommends that you continue on your current medications as directed. Please refer to the Current Medication list given to you today.  Your physician wants you to follow-up in: 6 month ov You will receive a reminder letter in the mail two months in advance. If you don't receive a letter, please call our office to schedule the follow-up appointment.

## 2014-09-17 NOTE — Assessment & Plan Note (Signed)
The patient is not experiencing any pitting edema

## 2014-09-18 LAB — LIPID PANEL
CHOL/HDL RATIO: 3
CHOLESTEROL: 229 mg/dL — AB (ref 0–200)
HDL: 87.2 mg/dL (ref >39.00)
LDL Cholesterol: 125 mg/dL — ABNORMAL HIGH (ref 0–99)
NonHDL: 141.8
TRIGLYCERIDES: 86 mg/dL (ref 0.0–149.0)
VLDL: 17.2 mg/dL (ref 0.0–40.0)

## 2014-09-18 LAB — BASIC METABOLIC PANEL
BUN: 18 mg/dL (ref 6–23)
CALCIUM: 9.4 mg/dL (ref 8.4–10.5)
CO2: 32 meq/L (ref 19–32)
CREATININE: 0.9 mg/dL (ref 0.4–1.2)
Chloride: 104 mEq/L (ref 96–112)
GFR: 68.95 mL/min (ref >60.00)
Glucose, Bld: 96 mg/dL (ref 70–99)
Potassium: 4.7 mEq/L (ref 3.5–5.1)
SODIUM: 139 meq/L (ref 135–145)

## 2014-09-18 LAB — CBC WITH DIFFERENTIAL/PLATELET
BASOS ABS: 0 10*3/uL (ref 0.0–0.1)
BASOS PCT: 0.7 % (ref 0.0–3.0)
EOS ABS: 0.1 10*3/uL (ref 0.0–0.7)
Eosinophils Relative: 1.9 % (ref 0.0–5.0)
HCT: 41.2 % (ref 36.0–46.0)
HEMOGLOBIN: 13.6 g/dL (ref 12.0–15.0)
LYMPHS ABS: 1.6 10*3/uL (ref 0.7–4.0)
Lymphocytes Relative: 38.3 % (ref 12.0–46.0)
MCHC: 33.1 g/dL (ref 30.0–36.0)
MCV: 92.4 fl (ref 78.0–100.0)
MONO ABS: 0.5 10*3/uL (ref 0.1–1.0)
Monocytes Relative: 10.8 % (ref 3.0–12.0)
NEUTROS ABS: 2.1 10*3/uL (ref 1.4–7.7)
Neutrophils Relative %: 48.3 % (ref 43.0–77.0)
Platelets: 229 10*3/uL (ref 150.0–400.0)
RBC: 4.46 Mil/uL (ref 3.87–5.11)
RDW: 13.7 % (ref 11.5–15.5)
WBC: 4.3 10*3/uL (ref 4.0–10.5)

## 2014-09-18 LAB — HEPATIC FUNCTION PANEL
ALBUMIN: 4.3 g/dL (ref 3.5–5.2)
ALK PHOS: 68 U/L (ref 39–117)
ALT: 12 U/L (ref 0–35)
AST: 21 U/L (ref 0–37)
Bilirubin, Direct: 0 mg/dL (ref 0.0–0.3)
TOTAL PROTEIN: 7 g/dL (ref 6.0–8.3)
Total Bilirubin: 0.6 mg/dL (ref 0.2–1.2)

## 2014-09-18 NOTE — Progress Notes (Signed)
Quick Note:  Please report to patient. The recent labs are stable. Continue same medication and careful diet. Cholesterol slightly elevated. Work harder on diet. Continue exercise. ______

## 2014-09-23 DIAGNOSIS — L408 Other psoriasis: Secondary | ICD-10-CM | POA: Diagnosis not present

## 2014-09-30 DIAGNOSIS — L400 Psoriasis vulgaris: Secondary | ICD-10-CM | POA: Diagnosis not present

## 2014-10-07 DIAGNOSIS — L400 Psoriasis vulgaris: Secondary | ICD-10-CM | POA: Diagnosis not present

## 2014-10-16 DIAGNOSIS — L400 Psoriasis vulgaris: Secondary | ICD-10-CM | POA: Diagnosis not present

## 2014-10-28 ENCOUNTER — Encounter: Payer: Self-pay | Admitting: Cardiology

## 2014-11-06 DIAGNOSIS — L400 Psoriasis vulgaris: Secondary | ICD-10-CM | POA: Diagnosis not present

## 2014-11-12 DIAGNOSIS — L400 Psoriasis vulgaris: Secondary | ICD-10-CM | POA: Diagnosis not present

## 2014-11-18 DIAGNOSIS — L400 Psoriasis vulgaris: Secondary | ICD-10-CM | POA: Diagnosis not present

## 2014-11-26 DIAGNOSIS — L400 Psoriasis vulgaris: Secondary | ICD-10-CM | POA: Diagnosis not present

## 2014-12-02 DIAGNOSIS — L400 Psoriasis vulgaris: Secondary | ICD-10-CM | POA: Diagnosis not present

## 2014-12-03 DIAGNOSIS — Z85820 Personal history of malignant melanoma of skin: Secondary | ICD-10-CM | POA: Diagnosis not present

## 2014-12-03 DIAGNOSIS — L400 Psoriasis vulgaris: Secondary | ICD-10-CM | POA: Diagnosis not present

## 2014-12-03 DIAGNOSIS — L0100 Impetigo, unspecified: Secondary | ICD-10-CM | POA: Diagnosis not present

## 2014-12-03 DIAGNOSIS — Z85828 Personal history of other malignant neoplasm of skin: Secondary | ICD-10-CM | POA: Diagnosis not present

## 2014-12-10 DIAGNOSIS — L400 Psoriasis vulgaris: Secondary | ICD-10-CM | POA: Diagnosis not present

## 2015-02-24 DIAGNOSIS — Z85820 Personal history of malignant melanoma of skin: Secondary | ICD-10-CM | POA: Diagnosis not present

## 2015-02-24 DIAGNOSIS — L400 Psoriasis vulgaris: Secondary | ICD-10-CM | POA: Diagnosis not present

## 2015-02-24 DIAGNOSIS — Z85828 Personal history of other malignant neoplasm of skin: Secondary | ICD-10-CM | POA: Diagnosis not present

## 2015-04-03 ENCOUNTER — Ambulatory Visit (INDEPENDENT_AMBULATORY_CARE_PROVIDER_SITE_OTHER): Payer: Medicare Other | Admitting: Cardiology

## 2015-04-03 ENCOUNTER — Encounter: Payer: Self-pay | Admitting: Cardiology

## 2015-04-03 DIAGNOSIS — R011 Cardiac murmur, unspecified: Secondary | ICD-10-CM

## 2015-04-03 DIAGNOSIS — E7800 Pure hypercholesterolemia, unspecified: Secondary | ICD-10-CM

## 2015-04-03 DIAGNOSIS — I10 Essential (primary) hypertension: Secondary | ICD-10-CM | POA: Diagnosis not present

## 2015-04-03 LAB — HEPATIC FUNCTION PANEL
ALBUMIN: 4.3 g/dL (ref 3.5–5.2)
ALT: 10 U/L (ref 0–35)
AST: 18 U/L (ref 0–37)
Alkaline Phosphatase: 79 U/L (ref 39–117)
Bilirubin, Direct: 0.1 mg/dL (ref 0.0–0.3)
Total Bilirubin: 0.5 mg/dL (ref 0.2–1.2)
Total Protein: 7.2 g/dL (ref 6.0–8.3)

## 2015-04-03 LAB — LIPID PANEL
Cholesterol: 189 mg/dL (ref 0–200)
HDL: 81 mg/dL (ref >39.00)
LDL CALC: 95 mg/dL (ref 0–99)
NONHDL: 108
Total CHOL/HDL Ratio: 2
Triglycerides: 65 mg/dL (ref 0.0–149.0)
VLDL: 13 mg/dL (ref 0.0–40.0)

## 2015-04-03 LAB — BASIC METABOLIC PANEL
BUN: 12 mg/dL (ref 6–23)
CO2: 29 meq/L (ref 19–32)
Calcium: 10 mg/dL (ref 8.4–10.5)
Chloride: 102 mEq/L (ref 96–112)
Creatinine, Ser: 0.64 mg/dL (ref 0.40–1.20)
GFR: 98.11 mL/min (ref >60.00)
GLUCOSE: 88 mg/dL (ref 70–99)
Potassium: 4 mEq/L (ref 3.5–5.1)
SODIUM: 137 meq/L (ref 135–145)

## 2015-04-03 NOTE — Progress Notes (Signed)
Cardiology Office Note   Date:  04/03/2015   ID:  Chelsea Barnett, DOB 23-Aug-1946, MRN [id number]  PCP:  No PCP Per Patient  Cardiologist:   Darlin Coco, MD   Chief Complaint  Patient presents with  . Follow-up    6 MONTH      History of Present Illness: Chelsea Barnett is a 69 y.o. female who presents for a six-month follow-up office visit. This pleasant 69 year old woman is seen for a scheduled followup office visit. We  saw her in November 2014 because of some left posterior pleuritic chest discomfort which started 2 weeks after she had a long airplane trip to [place]. She has been basically very healthy. The patient has a history of varicose veins and venous insufficiency of the right leg with intermittent dependent edema. She does not have any prior history of a known heart murmur. She had an echocardiogram in 09/04/13 which showed normal systolic function with ejection fraction 50-55% and grade 1 diastolic dysfunction. There was mild aortic insufficiency and mild mitral regurgitation. She has a family history of atrial fibrillation in a sister and also her mother. In November 2014 we obtained an EKG and a chest x-ray and a d-dimer all of which were normal and there was no evidence of a pulmonary embolus. Since then the patient has been feeling well. She walks in her neighborhood for exercise. Today her blood pressure was elevated on arrival.  I rechecked it and come down to 150/82.  She has not been having any shortness of breath or chest pain.  No palpitations dizziness or syncope. She has been having some arthritic swelling and discomfort over right thumb and right hand and she will see Dr. Amedeo Plenty. She is on new medication for her psoriasis, [place] Nail, which appears to be helping her and working well so far.  Past Medical History  Diagnosis Date  . Psoriasis   . Melanoma 2005  . Basal cell cancer   . Vaginal delivery     times 5  . Migraines     with  aura  . Heart murmur     Past Surgical History  Procedure Laterality Date  . Melanoma removal      left upper arm  . Basal cell ca removal    . Fallopian tube removal      laparoscopic bso- fibroma  . Ovaries removed      laparoscopic bso- fibroma  . Refractive surgery    . Bunionectomy Right   . Tubal ligation  1984     Current Outpatient Prescriptions  Medication Sig Dispense Refill  . Apremilast (OTEZLA PO) Take 1 tablet by mouth daily.    Marland Kitchen FLUOCINOLONE ACETONIDE SCALP 0.01 % OIL     . FOLIC ACID PO Take 1 tablet by mouth daily. occ    . triamcinolone cream (KENALOG) 0.1 % daily.     No current facility-administered medications for this visit.    Allergies:   Review of patient's allergies indicates no known allergies.    Social History:  The patient  reports that she has never smoked. She does not have any smokeless tobacco history on file. She reports that she does not drink alcohol or use illicit drugs.   Family History:  The patient's family history includes Depression in her sister; Heart disease in her brother, father, mother, and sister; Heart failure in her father; Hypertension in her mother; Melanoma in her mother and sister.    ROS:  Please  see the history of present illness.   Otherwise, review of systems are positive for none.   All other systems are reviewed and negative.    PHYSICAL EXAM: VS:  BP 154/92 mmHg  Pulse 79  Ht 5\' 4"  (1.626 m)  Wt 132 lb (59.875 kg)  BMI 22.65 kg/m2  SpO2 99%  LMP 12/28/1999 , BMI Body mass index is 22.65 kg/(m^2). GEN: Well nourished, well developed, in no acute distress HEENT: normal Neck: no JVD, carotid bruits, or masses Cardiac: RRR; grade 2/6 systolic ejection murmur at base.  No diastolic murmur heard Respiratory:  clear to auscultation bilaterally, normal work of breathing GI: soft, nontender, nondistended, + BS MS: no deformity or atrophy.  Her right thumb is swollen when compared with the left Skin: warm and  dry, no rash Neuro:  Strength and sensation are intact Psych: euthymic mood, full affect   EKG:  EKG is not ordered today.    Recent Labs: 09/17/2014: Hemoglobin 13.6; Platelets 229.0 04/03/2015: ALT 10; BUN 12; Creatinine 0.64; Potassium 4.0; Sodium 137    Lipid Panel    Component Value Date/Time   CHOL 189 04/03/2015 1058   TRIG 65.0 04/03/2015 1058   HDL 81.00 04/03/2015 1058   CHOLHDL 2 04/03/2015 1058   VLDL 13.0 04/03/2015 1058   LDLCALC 95 04/03/2015 1058   LDLDIRECT 112.2 08/23/2013 0822      Wt Readings from Last 3 Encounters:  04/03/15 132 lb (59.875 kg)  09/17/14 134 lb (60.782 kg)  05/02/14 134 lb (60.782 kg)         ASSESSMENT AND PLAN:  1. systolic heart murmur with previous echocardiogram September 2014 showing mild aortic insufficiency and mild mitral regurgitation.  We will update her echo. 2. Hypercholesterolemia, not on statin therapy. 3. psoriasis followed by Dr. Amy Martinique. Recently started on [place] with good initial results  Disposition: Continue current medication. Await results of today's labs.She will get in on July blood pressure machine for home use.  She will return for an echocardiogram. Recheck in 6 months for office visit. Continue walking for exercise.   Current medicines are reviewed at length with the patient today.  The patient does not have concerns regarding medicines.  The following changes have been made:  no change  Labs/ tests ordered today include:   Orders Placed This Encounter  Procedures  . Lipid panel  . Hepatic function panel  . Basic metabolic panel  . 2D Echocardiogram without contrast     Recheck in 6 months for office visit and EKG   Signed, Darlin Coco, MD  04/03/2015 1:28 PM    [facility] [place], [place], [place]  [address] Phone: [phone]; Fax: [phone]

## 2015-04-03 NOTE — Patient Instructions (Signed)
Medication Instructions:  Your physician recommends that you continue on your current medications as directed. Please refer to the Current Medication list given to you today.   Labwork: Lp/bmet/hfp  Testing/Procedures: Your physician has requested that you have an echocardiogram. Echocardiography is a painless test that uses sound waves to create images of your heart. It provides your doctor with information about the size and shape of your heart and how well your heart's chambers and valves are working. This procedure takes approximately one hour. There are no restrictions for this procedure.   Follow-Up: Your physician wants you to follow-up in: 6 month ov/ekg You will receive a reminder letter in the mail two months in advance. If you don't receive a letter, please call our office to schedule the follow-up appointment.   Any Other Special Instructions Will Be Listed Below (If Applicable). Get [employer]ron blood pressure cuff and monitor blood pressure at home

## 2015-04-04 NOTE — Progress Notes (Signed)
Quick Note:  Please report to patient. The recent labs are stable. Continue same medication and careful diet. Lipids are satisfactory. Cholesterol is better. HDL is nice and high so risk ratio is low ______

## 2015-04-07 DIAGNOSIS — Z85820 Personal history of malignant melanoma of skin: Secondary | ICD-10-CM | POA: Diagnosis not present

## 2015-04-07 DIAGNOSIS — Z85828 Personal history of other malignant neoplasm of skin: Secondary | ICD-10-CM | POA: Diagnosis not present

## 2015-04-07 DIAGNOSIS — L400 Psoriasis vulgaris: Secondary | ICD-10-CM | POA: Diagnosis not present

## 2015-04-10 DIAGNOSIS — M19041 Primary osteoarthritis, right hand: Secondary | ICD-10-CM | POA: Diagnosis not present

## 2015-04-10 DIAGNOSIS — M79644 Pain in right finger(s): Secondary | ICD-10-CM | POA: Diagnosis not present

## 2015-04-21 ENCOUNTER — Ambulatory Visit (HOSPITAL_COMMUNITY): Payer: Medicare Other | Attending: Cardiology | Admitting: Radiology

## 2015-04-21 ENCOUNTER — Telehealth: Payer: Self-pay | Admitting: Cardiology

## 2015-04-21 DIAGNOSIS — R011 Cardiac murmur, unspecified: Secondary | ICD-10-CM

## 2015-04-21 DIAGNOSIS — Z79899 Other long term (current) drug therapy: Secondary | ICD-10-CM

## 2015-04-21 NOTE — Telephone Encounter (Signed)
Please report to patient.  The echocardiogram is stable.  The left ventricular function is normal.  The aortic valve has mild to moderate aortic insufficiency and the mitral valve has only trivial regurgitation.  Continue on current medication.  We should also add a low-dose ARB to help with preventing aortic valve insufficiency to get worse and to help keep blood pressure under good control.  Start losartan 25 mg one daily and get a BMET 7-10 days later.

## 2015-04-21 NOTE — Progress Notes (Signed)
Echocardiogram performed.  

## 2015-04-22 ENCOUNTER — Telehealth: Payer: Self-pay | Admitting: Cardiology

## 2015-04-22 MED ORDER — LOSARTAN POTASSIUM 25 MG PO TABS: 25.0000 mg | ORAL_TABLET | Freq: Every day | ORAL | Status: DC

## 2015-04-22 NOTE — Telephone Encounter (Signed)
Discussed patient starting Losartan per echo report with  Dr. Mare Ferrari, continue as recommended Advised patient and she will continue to monitor blood pressure at home

## 2015-04-22 NOTE — Telephone Encounter (Signed)
Advised patient, verbalized understanding. Patient will continue to monitor blood pressure at home Follow up labs scheduled

## 2015-04-22 NOTE — Telephone Encounter (Signed)
New message      Bp at home is 121/67 pulse is 66.  Pt was seen today

## 2015-04-22 NOTE — Addendum Note (Signed)
Addended by: Alvina Filbert B on: 04/22/2015 12:01 PM   Modules accepted: Orders

## 2015-05-01 ENCOUNTER — Other Ambulatory Visit (INDEPENDENT_AMBULATORY_CARE_PROVIDER_SITE_OTHER): Payer: Medicare Other | Admitting: *Deleted

## 2015-05-01 DIAGNOSIS — Z79899 Other long term (current) drug therapy: Secondary | ICD-10-CM | POA: Diagnosis not present

## 2015-05-01 LAB — BASIC METABOLIC PANEL
BUN: 14 mg/dL (ref 6–23)
CALCIUM: 9.4 mg/dL (ref 8.4–10.5)
CO2: 28 mEq/L (ref 19–32)
CREATININE: 0.61 mg/dL (ref 0.40–1.20)
Chloride: 103 mEq/L (ref 96–112)
GFR: 103.68 mL/min (ref >60.00)
GLUCOSE: 121 mg/dL — AB (ref 70–99)
Potassium: 4 mEq/L (ref 3.5–5.1)
Sodium: 136 mEq/L (ref 135–145)

## 2015-05-02 NOTE — Progress Notes (Signed)
Quick Note:  Please report to patient. The recent labs are stable. Continue same medication and careful diet. Kidneys okay on losartan. CSD. ______

## 2015-05-06 ENCOUNTER — Encounter: Payer: Self-pay | Admitting: Certified Nurse Midwife

## 2015-05-06 ENCOUNTER — Ambulatory Visit (INDEPENDENT_AMBULATORY_CARE_PROVIDER_SITE_OTHER): Payer: Medicare Other | Admitting: Certified Nurse Midwife

## 2015-05-06 VITALS — BP 118/78 | HR 70 | Resp 16 | Ht 64.0 in | Wt 128.8 lb

## 2015-05-06 DIAGNOSIS — Z01419 Encounter for gynecological examination (general) (routine) without abnormal findings: Secondary | ICD-10-CM

## 2015-05-06 DIAGNOSIS — Z124 Encounter for screening for malignant neoplasm of cervix: Secondary | ICD-10-CM | POA: Diagnosis not present

## 2015-05-06 DIAGNOSIS — Z23 Encounter for immunization: Secondary | ICD-10-CM | POA: Diagnosis not present

## 2015-05-06 MED ORDER — TETANUS-DIPHTH-ACELL PERTUSSIS 5-2.5-18.5 LF-MCG/0.5 IM SUSP: 0.5000 mL | Freq: Once | INTRAMUSCULAR | Status: DC

## 2015-05-06 MED ORDER — TETANUS-DIPHTH-ACELL PERTUSSIS 5-2.5-18.5 LF-MCG/0.5 IM SUSP
0.5000 mL | Freq: Once | INTRAMUSCULAR | Status: AC
  Administered 2015-05-06: 0.5 mL via INTRAMUSCULAR

## 2015-05-06 NOTE — Progress Notes (Signed)
69 y.o. [id number] Married  Caucasian Fe here for annual exam. Menopausal no HRT. Sees Cardiology for aex/labs/ and hypertension management. Losartan for 2 weeks now. Daughters  all living  in the states now. Grandson with blood disorder [place] stable now. Denies vaginal bleeding or vaginal dryness. Has been using Olive oil but plans to try to coconut oil due to easier to use. Treating self for arthritis with OTC wintergreen and staying active now. No other health issues today.  Patient's last menstrual period was 12/28/1999.          Sexually active: Yes.    The current method of family planning is tubal ligation.    Exercising: Yes.    Walking 3-4/xwk Smoker:  no  Health Maintenance: Pap:  03/29/12 wnl HR HPV negative  MMG:  09/17/14 breast density b; bi-rads 2: benign SBE: no Colonoscopy:  12/27/2004 Normal f/u in 10 years due this year BMD:  05/02/2012 stable f/u dexa due  TDaP:  due Labs: PCP   reports that she has never smoked. She does not have any smokeless tobacco history on file. She reports that she does not drink alcohol or use illicit drugs.  Past Medical History  Diagnosis Date  . Psoriasis   . Melanoma 2005  . Basal cell cancer   . Vaginal delivery     times 5  . Migraines     with aura  . Heart murmur     Past Surgical History  Procedure Laterality Date  . Melanoma removal      left upper arm  . Basal cell ca removal    . Fallopian tube removal      laparoscopic bso- fibroma  . Ovaries removed      laparoscopic bso- fibroma  . Refractive surgery    . Bunionectomy Right   . Tubal ligation  1984    Current Outpatient Prescriptions  Medication Sig Dispense Refill  . Apremilast (OTEZLA PO) Take 1 tablet by mouth daily.    Marland Kitchen FOLIC ACID PO Take 1 tablet by mouth daily. occ    . losartan (COZAAR) 25 MG tablet Take 1 tablet (25 mg total) by mouth daily. 90 tablet 3  . triamcinolone cream (KENALOG) 0.1 % as needed.     Marland Kitchen FLUOCINOLONE ACETONIDE SCALP 0.01 % OIL      No  current facility-administered medications for this visit.    Family History  Problem Relation Age of Onset  . Heart failure Father   . Heart disease Father     CVD  . Melanoma Mother   . Hypertension Mother   . Heart disease Mother     CVD  . Depression Sister   . Melanoma Sister   . Heart disease Brother   . Heart disease Sister     ROS:  Pertinent items are noted in HPI.  Otherwise, a comprehensive ROS was negative.  Exam:   LMP 12/28/1999   Ht Readings from Last 3 Encounters:  04/03/15 5\' 4"  (1.626 m)  09/17/14 5\' 4"  (1.626 m)  05/02/14 5\' 4"  (1.626 m)    General appearance: alert, cooperative and appears stated age Head: Normocephalic, without obvious abnormality, atraumatic Neck: no adenopathy, supple, symmetrical, trachea midline and thyroid normal to inspection and palpation Lungs: clear to auscultation bilaterally Breasts: normal appearance, no masses or tenderness, No nipple retraction or dimpling, No nipple discharge or bleeding, No axillary or supraclavicular adenopathy Heart: regular rate and rhythm Abdomen: soft, non-tender; no masses,  no organomegaly Extremities: extremities  normal, atraumatic, no cyanosis or edema Skin: Skin color, texture, turgor normal. No rashes or lesions Lymph nodes: Cervical, supraclavicular, and axillary nodes normal. No abnormal inguinal nodes palpated Neurologic: Grossly normal   Pelvic: External genitalia:  no lesions              Urethra:  normal appearing urethra with no masses, tenderness or lesions              Bartholin's and Skene's: normal                 Vagina: normal appearing vagina with normal color and discharge, no lesions              Cervix: normal,non tender, no lesions              Pap taken: Yes.   Bimanual Exam:  Uterus:  normal size, contour, position, consistency, mobility, non-tender              Adnexa: no mass, fullness, tenderness and adnexal absent               Rectovaginal: Confirms                Anus:  normal sphincter tone, no lesions  Chaperone present: Yes  A:  Well Woman with normal exam  Menopausal no HRT S/P BSO for fibroma  Hypertension with Cardiology management  History of Melanoma on left arm  BMD and TDAP,colonoscopy due    P:   Reviewed health and wellness pertinent to exam  Aware of need to evaluate if vaginal bleeding  Continue follow up as indicated with MD  Continue follow up with dermatology  Patient requests TDAP  Patient will schedule BMD and plans to schedule her colonoscopy, has reminder  Pap smear taken today  counseled on breast self exam, mammography screening, adequate intake of calcium and vitamin D, diet and exercise  return annually or prn  An After Visit Summary was printed and given to the patient.

## 2015-05-06 NOTE — Patient Instructions (Signed)

## 2015-05-07 LAB — IPS PAP SMEAR ONLY

## 2015-05-12 NOTE — Progress Notes (Signed)
Reviewed personally.  M. Suzanne Xee Hollman, MD.  

## 2015-06-04 ENCOUNTER — Telehealth: Payer: Self-pay | Admitting: Cardiology

## 2015-06-04 MED ORDER — AZITHROMYCIN 250 MG PO TABS: ORAL_TABLET | ORAL | Status: DC

## 2015-06-04 NOTE — Telephone Encounter (Signed)
Okay to send her a Z-Pak.  Would not start it unless she starts to demonstrate symptoms personally herself.

## 2015-06-04 NOTE — Telephone Encounter (Signed)
Will forward to  Dr. Brackbill for review 

## 2015-06-04 NOTE — Telephone Encounter (Signed)
New Message  Pt called states that she has friend who has whooping cough was informed that she will need to take a Zpack

## 2015-06-04 NOTE — Telephone Encounter (Signed)
Spoke with patient and sent to pharmacy

## 2015-06-05 NOTE — Telephone Encounter (Signed)
Patient did say that the person exposed her to whooping cough is a physician and CDC recommended treatment with Zpak, ok per  Dr. Mare Ferrari

## 2015-06-26 DIAGNOSIS — Z85828 Personal history of other malignant neoplasm of skin: Secondary | ICD-10-CM | POA: Diagnosis not present

## 2015-06-26 DIAGNOSIS — L400 Psoriasis vulgaris: Secondary | ICD-10-CM | POA: Diagnosis not present

## 2015-06-26 DIAGNOSIS — Z85820 Personal history of malignant melanoma of skin: Secondary | ICD-10-CM | POA: Diagnosis not present

## 2015-07-14 DIAGNOSIS — M79644 Pain in right finger(s): Secondary | ICD-10-CM | POA: Diagnosis not present

## 2015-07-14 DIAGNOSIS — M19041 Primary osteoarthritis, right hand: Secondary | ICD-10-CM | POA: Diagnosis not present

## 2015-08-13 DIAGNOSIS — M79644 Pain in right finger(s): Secondary | ICD-10-CM | POA: Diagnosis not present

## 2015-08-13 DIAGNOSIS — M19041 Primary osteoarthritis, right hand: Secondary | ICD-10-CM | POA: Diagnosis not present

## 2015-08-26 DIAGNOSIS — L821 Other seborrheic keratosis: Secondary | ICD-10-CM | POA: Diagnosis not present

## 2015-08-26 DIAGNOSIS — Z85828 Personal history of other malignant neoplasm of skin: Secondary | ICD-10-CM | POA: Diagnosis not present

## 2015-08-26 DIAGNOSIS — L814 Other melanin hyperpigmentation: Secondary | ICD-10-CM | POA: Diagnosis not present

## 2015-08-26 DIAGNOSIS — D2272 Melanocytic nevi of left lower limb, including hip: Secondary | ICD-10-CM | POA: Diagnosis not present

## 2015-08-26 DIAGNOSIS — L570 Actinic keratosis: Secondary | ICD-10-CM | POA: Diagnosis not present

## 2015-08-26 DIAGNOSIS — D1801 Hemangioma of skin and subcutaneous tissue: Secondary | ICD-10-CM | POA: Diagnosis not present

## 2015-08-26 DIAGNOSIS — Z85820 Personal history of malignant melanoma of skin: Secondary | ICD-10-CM | POA: Diagnosis not present

## 2015-08-26 DIAGNOSIS — L400 Psoriasis vulgaris: Secondary | ICD-10-CM | POA: Diagnosis not present

## 2015-08-26 DIAGNOSIS — D225 Melanocytic nevi of trunk: Secondary | ICD-10-CM | POA: Diagnosis not present

## 2015-10-21 DIAGNOSIS — Z85828 Personal history of other malignant neoplasm of skin: Secondary | ICD-10-CM | POA: Diagnosis not present

## 2015-10-21 DIAGNOSIS — L400 Psoriasis vulgaris: Secondary | ICD-10-CM | POA: Diagnosis not present

## 2015-10-21 DIAGNOSIS — Z85820 Personal history of malignant melanoma of skin: Secondary | ICD-10-CM | POA: Diagnosis not present

## 2015-11-03 ENCOUNTER — Encounter: Payer: Self-pay | Admitting: Cardiology

## 2015-11-03 ENCOUNTER — Ambulatory Visit (INDEPENDENT_AMBULATORY_CARE_PROVIDER_SITE_OTHER): Payer: Medicare Other | Admitting: Cardiology

## 2015-11-03 VITALS — BP 130/84 | HR 62 | Ht 64.0 in | Wt 133.4 lb

## 2015-11-03 DIAGNOSIS — R011 Cardiac murmur, unspecified: Secondary | ICD-10-CM

## 2015-11-03 DIAGNOSIS — I10 Essential (primary) hypertension: Secondary | ICD-10-CM

## 2015-11-03 DIAGNOSIS — L409 Psoriasis, unspecified: Secondary | ICD-10-CM

## 2015-11-03 DIAGNOSIS — Z1231 Encounter for screening mammogram for malignant neoplasm of breast: Secondary | ICD-10-CM | POA: Diagnosis not present

## 2015-11-03 NOTE — Patient Instructions (Signed)
Medication Instructions:  Your physician recommends that you continue on your current medications as directed. Please refer to the Current Medication list given to you today.  Labwork: none  Testing/Procedures: none  Follow-Up: Your physician wants you to follow-up in: 6 months with fasting labs (lp/bmet/hfp) with Dr Johann Capers will receive a reminder letter in the mail two months in advance. If you don't receive a letter, please call our office to schedule the follow-up appointment.  If you need a refill on your cardiac medications before your next appointment, please call your pharmacy.

## 2015-11-03 NOTE — Progress Notes (Signed)
Cardiology Office Note   Date:  11/04/2015   ID:  Chelsea Barnett, DOB [birth year], MRN [id number]  PCP:  No PCP Per Patient  Cardiologist: Darlin Coco MD  Chief Complaint  Patient presents with  . Chest Pain      History of Present Illness: Chelsea Barnett is a 69 y.o. female who presents for a six-month office visit  This pleasant 69 year old woman is seen for a scheduled followup office visit. We saw her in November 2014 because of some left posterior pleuritic chest discomfort which started 2 weeks after she had a long airplane trip to [place].  Workup for possible pulmonary emboli at the time was negative. She has been basically very healthy. The patient has a history of varicose veins and venous insufficiency of the right leg with intermittent dependent edema. She does not have any prior history of a known heart murmur. She had an echocardiogram in 09/04/13 which showed normal systolic function with ejection fraction 50-55% and grade 1 diastolic dysfunction. There was mild aortic insufficiency and mild mitral regurgitation. She has a family history of atrial fibrillation in a sister and also her mother. In November 2014 we obtained an EKG and a chest x-ray and a d-dimer all of which were normal and there was no evidence of a pulmonary embolus.  She walks in her neighborhood for exercise. She has a past history of mild hypertension.  Since last visit she has been on losartan 25 mg one daily.  Blood pressure today is normal.. She has not been having any shortness of breath or chest pain. No palpitations dizziness or syncope.  She is on new medication for her psoriasis, [place] Nail, which appears to be helping her and working well so far except for her scalp.  Past Medical History  Diagnosis Date  . Psoriasis   . Melanoma ([place]) 2005  . Basal cell cancer   . Vaginal delivery     times 5  . Migraines     with aura  . Heart murmur     Past Surgical History    Procedure Laterality Date  . Melanoma removal      left upper arm  . Basal cell ca removal    . Fallopian tube removal      laparoscopic bso- fibroma  . Ovaries removed      laparoscopic bso- fibroma  . Refractive surgery    . Bunionectomy Right   . Tubal ligation  1984     Current Outpatient Prescriptions  Medication Sig Dispense Refill  . Apremilast (OTEZLA) 30 MG TABS Take 30 mg by mouth daily.    Marland Kitchen FLUOCINOLONE ACETONIDE SCALP 0.01 % OIL Apply 1 application topically as needed (skin irritation).     Marland Kitchen losartan (COZAAR) 25 MG tablet Take 1 tablet (25 mg total) by mouth daily. 90 tablet 3  . Naproxen Sodium (ALEVE PO) Take 220 mg by mouth as needed (pain).     . triamcinolone cream (KENALOG) 0.1 % Apply 1 application topically as needed (skin irritation).      No current facility-administered medications for this visit.    Allergies:   Review of patient's allergies indicates no known allergies.    Social History:  The patient  reports that she has never smoked. She does not have any smokeless tobacco history on file. She reports that she does not drink alcohol or use illicit drugs.   Family History:  The patient's family history includes Depression in her sister;  Heart disease in her brother, father, mother, and sister; Heart failure in her father; Hypertension in her mother; Melanoma in her mother and sister.    ROS:  Please see the history of present illness.   Otherwise, review of systems are positive for none.   All other systems are reviewed and negative.    PHYSICAL EXAM: VS:  BP 130/84 mmHg  Pulse 62  Ht 5\' 4"  (1.626 m)  Wt 133 lb 6.4 oz (60.51 kg)  BMI 22.89 kg/m2  LMP 12/28/1999 , BMI Body mass index is 22.89 kg/(m^2). GEN: Well nourished, well developed, in no acute distress HEENT: normal Neck: no JVD, carotid bruits, or masses Cardiac: RRR; there is a faint systolic murmur at the aortic area.  No diastolic murmur heard.  There is no rubs, or gallops,no  edema  Respiratory:  clear to auscultation bilaterally, normal work of breathing GI: soft, nontender, nondistended, + BS MS: no deformity or atrophy Skin: warm and dry, no rash Neuro:  Strength and sensation are intact Psych: euthymic mood, full affect   EKG:  EKG is ordered today. The ekg ordered today demonstrates normal sinus rhythm.  Within normal limits.   Recent Labs: 04/03/2015: ALT 10 05/01/2015: BUN 14; Creatinine, Ser 0.61; Potassium 4.0; Sodium 136    Lipid Panel    Component Value Date/Time   CHOL 189 04/03/2015 1058   TRIG 65.0 04/03/2015 1058   HDL 81.00 04/03/2015 1058   CHOLHDL 2 04/03/2015 1058   VLDL 13.0 04/03/2015 1058   LDLCALC 95 04/03/2015 1058   LDLDIRECT 112.2 08/23/2013 0822      Wt Readings from Last 3 Encounters:  11/03/15 133 lb 6.4 oz (60.51 kg)  05/06/15 128 lb 12.8 oz (58.423 kg)  04/03/15 132 lb (59.875 kg)         ASSESSMENT AND PLAN:  1. systolic heart murmur with echocardiogram 04/21/15 showing mild to moderate aortic insufficiency and normal left ventricular systolic function 2. Hypercholesterolemia, not on statin therapy. 3. psoriasis followed by Dr. Amy Martinique. Recently started on [place] with good initial results  Disposition: Continue current medication.Recheck in 6 months for follow-up office visit hepatic function panel basal metabolic panel and lipid panel with Dr. Meda Coffee.   Current medicines are reviewed at length with the patient today.  The patient does not have concerns regarding medicines.  The following changes have been made:  no change  Labs/ tests ordered today include:   Orders Placed This Encounter  Procedures  . EKG 12-Lead       Signed, Darlin Coco MD 11/04/2015 11:11 AM    [address] [place], [place],   [address] Phone: [phone]; Fax: [phone]

## 2015-12-18 ENCOUNTER — Telehealth: Payer: Self-pay | Admitting: Cardiology

## 2015-12-18 MED ORDER — AZITHROMYCIN 250 MG PO TABS: ORAL_TABLET | ORAL | Status: DC

## 2015-12-18 NOTE — Telephone Encounter (Signed)
Left message to call back  

## 2015-12-18 NOTE — Telephone Encounter (Signed)
Patient called back. Will send in to patient's pharmacy of choice.

## 2015-12-18 NOTE — Telephone Encounter (Signed)
Called patient back. Informed her that Dr. Mare Ferrari is not in the office today. Patient knows Dr. Mare Ferrari is retiring. Patient is following up with a new PCP, but is not an established patient yet, with her first appointment being in May. Will forward message to Dr. Mare Ferrari for further instructions.

## 2015-12-18 NOTE — Telephone Encounter (Signed)
Okay to give her a Zpak

## 2015-12-18 NOTE — Telephone Encounter (Signed)
New message     Patient going to [place] wants to know can Dr. Mare Ferrari prescribe her a z-pak due to husband still sick. Leaving Monday

## 2015-12-24 ENCOUNTER — Encounter: Payer: Self-pay | Admitting: *Deleted

## 2016-01-26 DIAGNOSIS — I10 Essential (primary) hypertension: Secondary | ICD-10-CM | POA: Diagnosis not present

## 2016-01-26 DIAGNOSIS — Z23 Encounter for immunization: Secondary | ICD-10-CM | POA: Diagnosis not present

## 2016-01-26 DIAGNOSIS — M19041 Primary osteoarthritis, right hand: Secondary | ICD-10-CM | POA: Diagnosis not present

## 2016-01-26 DIAGNOSIS — Z85820 Personal history of malignant melanoma of skin: Secondary | ICD-10-CM | POA: Diagnosis not present

## 2016-01-26 DIAGNOSIS — Z6823 Body mass index (BMI) 23.0-23.9, adult: Secondary | ICD-10-CM | POA: Diagnosis not present

## 2016-01-26 DIAGNOSIS — L409 Psoriasis, unspecified: Secondary | ICD-10-CM | POA: Diagnosis not present

## 2016-01-26 DIAGNOSIS — Z1389 Encounter for screening for other disorder: Secondary | ICD-10-CM | POA: Diagnosis not present

## 2016-03-23 ENCOUNTER — Other Ambulatory Visit: Payer: Self-pay | Admitting: Cardiology

## 2016-03-23 DIAGNOSIS — Z85820 Personal history of malignant melanoma of skin: Secondary | ICD-10-CM | POA: Diagnosis not present

## 2016-03-23 DIAGNOSIS — L400 Psoriasis vulgaris: Secondary | ICD-10-CM | POA: Diagnosis not present

## 2016-03-23 DIAGNOSIS — Z85828 Personal history of other malignant neoplasm of skin: Secondary | ICD-10-CM | POA: Diagnosis not present

## 2016-03-29 ENCOUNTER — Encounter: Payer: Self-pay | Admitting: Internal Medicine

## 2016-05-12 ENCOUNTER — Ambulatory Visit: Payer: Medicare Other | Admitting: Certified Nurse Midwife

## 2016-05-18 ENCOUNTER — Telehealth: Payer: Self-pay | Admitting: Internal Medicine

## 2016-05-18 ENCOUNTER — Ambulatory Visit (INDEPENDENT_AMBULATORY_CARE_PROVIDER_SITE_OTHER): Payer: Medicare Other | Admitting: Certified Nurse Midwife

## 2016-05-18 ENCOUNTER — Encounter: Payer: Self-pay | Admitting: Certified Nurse Midwife

## 2016-05-18 VITALS — BP 122/70 | HR 72 | Resp 16 | Ht 63.75 in | Wt 130.0 lb

## 2016-05-18 DIAGNOSIS — Z780 Asymptomatic menopausal state: Secondary | ICD-10-CM

## 2016-05-18 DIAGNOSIS — N952 Postmenopausal atrophic vaginitis: Secondary | ICD-10-CM | POA: Diagnosis not present

## 2016-05-18 DIAGNOSIS — Z01419 Encounter for gynecological examination (general) (routine) without abnormal findings: Secondary | ICD-10-CM

## 2016-05-18 DIAGNOSIS — Z124 Encounter for screening for malignant neoplasm of cervix: Secondary | ICD-10-CM

## 2016-05-18 NOTE — Patient Instructions (Signed)

## 2016-05-18 NOTE — Telephone Encounter (Signed)
Dr Hilarie Fredrickson, please take this patient's care.

## 2016-05-18 NOTE — Telephone Encounter (Signed)
ok 

## 2016-05-18 NOTE — Telephone Encounter (Signed)
Left message for call back to schedule procedure.

## 2016-05-18 NOTE — Progress Notes (Signed)
70 y.o. [id number] Married  Caucasian Fe here for annual exam. Menopausal no vaginal bleeding. Having vaginal dryness, using coconut oil with good results.. Seeing Lang Snow as PCP now, for labs, hypertension management.. [place] as cardiologist for heart murmur. All stable at present. Staying busy with helping with grandson who is now stable after spleen removal. Aware BMD is due. Also due for colonoscopy has appointment to discuss. No health issues today.   Patient's last menstrual period was 12/28/1999.          Sexually active: Yes.    The current method of family planning is tubal ligation.    Exercising: Yes.    walking Smoker:  no  Health Maintenance: Pap:  05-06-15 neg MMG:  11-03-15 category b density,birads 1:neg Colonoscopy: 2006 neg f/u 35yrs due plans to schedule BMD:   2013 TDaP:  2016 Shingles: 2-21yrs ago Pneumonia: no Hep C and HIV: not done Labs: none Self breast exam: done occ   reports that she has never smoked. She does not have any smokeless tobacco history on file. She reports that she does not drink alcohol or use illicit drugs.  Past Medical History  Diagnosis Date  . Psoriasis   . Melanoma ([place]) 2005  . Basal cell cancer   . Vaginal delivery     times 5  . Migraines     with aura  . Heart murmur     Past Surgical History  Procedure Laterality Date  . Melanoma removal      left upper arm  . Basal cell ca removal    . Fallopian tube removal      laparoscopic bso- fibroma  . Ovaries removed      laparoscopic bso- fibroma  . Refractive surgery    . Bunionectomy Right   . Tubal ligation  1984    Current Outpatient Prescriptions  Medication Sig Dispense Refill  . Apremilast (OTEZLA) 30 MG TABS Take 30 mg by mouth daily.    Marland Kitchen aspirin EC 81 MG tablet Take 81 mg by mouth daily.    Marland Kitchen FLUOCINOLONE ACETONIDE SCALP 0.01 % OIL Apply 1 application topically as needed (skin irritation).     Marland Kitchen losartan (COZAAR) 25 MG tablet Take 1 tablet (25 mg  total) by mouth daily. 90 tablet 2  . Naproxen Sodium (ALEVE PO) Take 220 mg by mouth as needed (pain).     . triamcinolone cream (KENALOG) 0.1 % Apply 1 application topically as needed (skin irritation).      No current facility-administered medications for this visit.    Family History  Problem Relation Age of Onset  . Heart failure Father   . Heart disease Father     CVD  . Melanoma Mother   . Hypertension Mother   . Heart disease Mother     CVD  . Depression Sister   . Melanoma Sister   . Heart disease Brother   . Heart disease Sister     ROS:  Pertinent items are noted in HPI.  Otherwise, a comprehensive ROS was negative.  Exam:   BP 122/70 mmHg  Pulse 72  Resp 16  Ht 5' 3.75" (1.619 m)  Wt 130 lb (58.968 kg)  BMI 22.50 kg/m2  LMP 12/28/1999 Height: 5' 3.75" (161.9 cm) Ht Readings from Last 3 Encounters:  05/18/16 5' 3.75" (1.619 m)  11/03/15 5\' 4"  (1.626 m)  05/06/15 5\' 4"  (1.626 m)    General appearance: alert, cooperative and appears stated age Head:  Normocephalic, without obvious abnormality, atraumatic Neck: no adenopathy, supple, symmetrical, trachea midline and thyroid normal to inspection and palpation Lungs: clear to auscultation bilaterally Breasts: normal appearance, no masses or tenderness, No nipple retraction or dimpling, No nipple discharge or bleeding, No axillary or supraclavicular adenopathy Heart: regular rate and rhythm, grade 2/4 systolic murmur heard Abdomen: soft, non-tender; no masses,  no organomegaly Extremities: extremities normal, atraumatic, no cyanosis or edema Skin: Skin color, texture, turgor normal. No rashes or lesions Lymph nodes: Cervical, supraclavicular, and axillary nodes normal. No abnormal inguinal nodes palpated Neurologic: Grossly normal   Pelvic: External genitalia:  no lesions              Urethra:  normal appearing urethra with no masses, tenderness or lesions              Bartholin's and Skene's: normal                  Vagina: normal appearing vagina with normal color and discharge, no lesions              Cervix: normal,non tender, no lesions              Pap taken: No. Bimanual Exam:  Uterus:  normal size, contour, position, consistency, mobility, non-tender              Adnexa: normal adnexa and no mass, fullness, tenderness               Rectovaginal: Confirms               Anus:  normal sphincter tone, no lesions  Chaperone present: yes  A:  Well Woman with normal exam  Menopausal no HRT  Atrophic vaginitis using coconut oil with good results  Hypertension/psoriasis management with PCP  Cardiology management for heart murmur, no change per last visit  BMD and colonoscopy due  P:   Reviewed health and wellness pertinent to exam  Aware of need to evaluate if vaginal bleeding  Will advise if problems with coconut oil  Continue follow up with MD as indicated  Patient will schedule BMD and colonoscopy  Pap smear as above not taken   counseled on breast self exam, mammography screening, adequate intake of calcium and vitamin D, diet and exercise, Kegel's exercises  return annually or prn  An After Visit Summary was printed and given to the patient.

## 2016-05-18 NOTE — Progress Notes (Signed)
Reviewed personally.  M. Suzanne Rut [place], MD.  

## 2016-05-19 ENCOUNTER — Encounter: Payer: Self-pay | Admitting: Internal Medicine

## 2016-05-26 ENCOUNTER — Encounter: Payer: Self-pay | Admitting: Cardiology

## 2016-05-26 DIAGNOSIS — E559 Vitamin D deficiency, unspecified: Secondary | ICD-10-CM | POA: Diagnosis not present

## 2016-05-26 DIAGNOSIS — Z0000 Encounter for general adult medical examination without abnormal findings: Secondary | ICD-10-CM | POA: Diagnosis not present

## 2016-05-26 DIAGNOSIS — R8290 Unspecified abnormal findings in urine: Secondary | ICD-10-CM | POA: Diagnosis not present

## 2016-05-26 DIAGNOSIS — M859 Disorder of bone density and structure, unspecified: Secondary | ICD-10-CM | POA: Diagnosis not present

## 2016-05-26 DIAGNOSIS — I10 Essential (primary) hypertension: Secondary | ICD-10-CM | POA: Diagnosis not present

## 2016-05-28 DIAGNOSIS — L409 Psoriasis, unspecified: Secondary | ICD-10-CM | POA: Diagnosis not present

## 2016-05-28 DIAGNOSIS — Z23 Encounter for immunization: Secondary | ICD-10-CM | POA: Diagnosis not present

## 2016-05-28 DIAGNOSIS — M859 Disorder of bone density and structure, unspecified: Secondary | ICD-10-CM | POA: Diagnosis not present

## 2016-05-28 DIAGNOSIS — Z1389 Encounter for screening for other disorder: Secondary | ICD-10-CM | POA: Diagnosis not present

## 2016-05-28 DIAGNOSIS — I10 Essential (primary) hypertension: Secondary | ICD-10-CM | POA: Diagnosis not present

## 2016-05-28 DIAGNOSIS — Z6821 Body mass index (BMI) 21.0-21.9, adult: Secondary | ICD-10-CM | POA: Diagnosis not present

## 2016-05-28 DIAGNOSIS — I8390 Asymptomatic varicose veins of unspecified lower extremity: Secondary | ICD-10-CM | POA: Diagnosis not present

## 2016-05-28 DIAGNOSIS — E559 Vitamin D deficiency, unspecified: Secondary | ICD-10-CM | POA: Diagnosis not present

## 2016-05-28 DIAGNOSIS — Z0000 Encounter for general adult medical examination without abnormal findings: Secondary | ICD-10-CM | POA: Diagnosis not present

## 2016-05-28 DIAGNOSIS — M19041 Primary osteoarthritis, right hand: Secondary | ICD-10-CM | POA: Diagnosis not present

## 2016-06-04 DIAGNOSIS — Z85828 Personal history of other malignant neoplasm of skin: Secondary | ICD-10-CM | POA: Diagnosis not present

## 2016-06-04 DIAGNOSIS — Z85820 Personal history of malignant melanoma of skin: Secondary | ICD-10-CM | POA: Diagnosis not present

## 2016-06-04 DIAGNOSIS — L820 Inflamed seborrheic keratosis: Secondary | ICD-10-CM | POA: Diagnosis not present

## 2016-06-04 DIAGNOSIS — L821 Other seborrheic keratosis: Secondary | ICD-10-CM | POA: Diagnosis not present

## 2016-06-04 DIAGNOSIS — L400 Psoriasis vulgaris: Secondary | ICD-10-CM | POA: Diagnosis not present

## 2016-06-04 DIAGNOSIS — D2272 Melanocytic nevi of left lower limb, including hip: Secondary | ICD-10-CM | POA: Diagnosis not present

## 2016-06-22 ENCOUNTER — Ambulatory Visit (AMBULATORY_SURGERY_CENTER): Payer: Self-pay

## 2016-06-22 VITALS — Ht 64.0 in | Wt 134.6 lb

## 2016-06-22 DIAGNOSIS — Z1211 Encounter for screening for malignant neoplasm of colon: Secondary | ICD-10-CM

## 2016-06-22 MED ORDER — NA SULFATE-K SULFATE-MG SULF 17.5-3.13-1.6 GM/177ML PO SOLN: ORAL | Status: DC

## 2016-06-22 NOTE — Progress Notes (Signed)
Per pt, no allergies to soy or egg products.Pt not taking any weight loss meds or using  O2 at home. 

## 2016-06-23 ENCOUNTER — Telehealth: Payer: Self-pay | Admitting: Internal Medicine

## 2016-07-09 DIAGNOSIS — L400 Psoriasis vulgaris: Secondary | ICD-10-CM | POA: Diagnosis not present

## 2016-07-09 DIAGNOSIS — Z85820 Personal history of malignant melanoma of skin: Secondary | ICD-10-CM | POA: Diagnosis not present

## 2016-07-09 DIAGNOSIS — Z79899 Other long term (current) drug therapy: Secondary | ICD-10-CM | POA: Diagnosis not present

## 2016-07-09 DIAGNOSIS — Z85828 Personal history of other malignant neoplasm of skin: Secondary | ICD-10-CM | POA: Diagnosis not present

## 2016-07-13 ENCOUNTER — Ambulatory Visit (AMBULATORY_SURGERY_CENTER): Payer: Medicare Other | Admitting: Internal Medicine

## 2016-07-13 ENCOUNTER — Encounter: Payer: Self-pay | Admitting: Internal Medicine

## 2016-07-13 VITALS — BP 131/64 | HR 59 | Temp 99.5°F | Resp 14 | Ht 63.75 in | Wt 130.0 lb

## 2016-07-13 DIAGNOSIS — Z1211 Encounter for screening for malignant neoplasm of colon: Secondary | ICD-10-CM | POA: Diagnosis not present

## 2016-07-13 DIAGNOSIS — I10 Essential (primary) hypertension: Secondary | ICD-10-CM | POA: Diagnosis not present

## 2016-07-13 MED ORDER — SODIUM CHLORIDE 0.9 % IV SOLN: 500.0000 mL | INTRAVENOUS | Status: DC

## 2016-07-13 NOTE — Patient Instructions (Signed)
YOU HAD AN ENDOSCOPIC PROCEDURE TODAY AT [facility]:   Refer to the procedure report that was given to you for any specific questions about what was found during the examination.  If the procedure report does not answer your questions, please call your gastroenterologist to clarify.  If you requested that your care partner not be given the details of your procedure findings, then the procedure report has been included in a sealed envelope for you to review at your convenience later.  YOU SHOULD EXPECT: Some feelings of bloating in the abdomen. Passage of more gas than usual.  Walking can help get rid of the air that was put into your GI tract during the procedure and reduce the bloating. If you had a lower endoscopy (such as a colonoscopy or flexible sigmoidoscopy) you may notice spotting of blood in your stool or on the toilet paper. If you underwent a bowel prep for your procedure, you may not have a normal bowel movement for a few days.  Please Note:  You might notice some irritation and congestion in your nose or some drainage.  This is from the oxygen used during your procedure.  There is no need for concern and it should clear up in a day or so.  SYMPTOMS TO REPORT IMMEDIATELY:   Following lower endoscopy (colonoscopy or flexible sigmoidoscopy):  Excessive amounts of blood in the stool  Significant tenderness or worsening of abdominal pains  Swelling of the abdomen that is new, acute  Fever of 100F or higher   For urgent or emergent issues, a gastroenterologist can be reached at any hour by calling [phone].   DIET: Your first meal following the procedure should be a small meal and then it is ok to progress to your normal diet. Heavy or fried foods are harder to digest and may make you feel nauseous or bloated.  Likewise, meals heavy in dairy and vegetables can increase bloating.  Drink plenty of fluids but you should avoid alcoholic beverages for 24 hours. Try to  increase the fiber in your diet, and drink plenty of water.  ACTIVITY:  You should plan to take it easy for the rest of today and you should NOT DRIVE or use heavy machinery until tomorrow (because of the sedation medicines used during the test).    FOLLOW UP: Our staff will call the number listed on your records the next business day following your procedure to check on you and address any questions or concerns that you may have regarding the information given to you following your procedure. If we do not reach you, we will leave a message.  However, if you are feeling well and you are not experiencing any problems, there is no need to return our call.  We will assume that you have returned to your regular daily activities without incident.  If any biopsies were taken you will be contacted by phone or by letter within the next 1-3 weeks.  Please call us at [phone] if you have not heard about the biopsies in 3 weeks.    SIGNATURES/CONFIDENTIALITY: You and/or your care partner have signed paperwork which will be entered into your electronic medical record.  These signatures attest to the fact that that the information above on your After Visit Summary has been reviewed and is understood.  Full responsibility of the confidentiality of this discharge information lies with you and/or your care-partner.

## 2016-07-13 NOTE — Progress Notes (Signed)
Report to PACU, RN, vss, BBS= Clear.  

## 2016-07-13 NOTE — Op Note (Signed)
[facility] Patient Name: Chelsea Barnett Procedure Date: 07/13/2016 1:12 PM MRN: [id number] Endoscopist: Jerene Bears , MD Age: 70 Referring MD:  Date of Birth: 1946/09/06 Gender: Female Account #: [id number] Procedure:                Colonoscopy Indications:              Screening for colorectal malignant neoplasm, Last                            colonoscopy: January 2004 Medicines:                Monitored Anesthesia Care Procedure:                Pre-Anesthesia Assessment:                           - Prior to the procedure, a History and Physical                            was performed, and patient medications and                            allergies were reviewed. The patient's tolerance of                            previous anesthesia was also reviewed. The risks                            and benefits of the procedure and the sedation                            options and risks were discussed with the patient.                            All questions were answered, and informed consent                            was obtained. Prior Anticoagulants: The patient has                            taken no previous anticoagulant or antiplatelet                            agents. ASA Grade Assessment: II - A patient with                            mild systemic disease. After reviewing the risks                            and benefits, the patient was deemed in                            satisfactory condition to undergo the procedure.  After obtaining informed consent, the colonoscope                            was passed under direct vision. Throughout the                            procedure, the patient's blood pressure, pulse, and                            oxygen saturations were monitored continuously. The                            Model PCF-[id number]) scope was introduced                            through the anus and advanced  to the the cecum,                            identified by appendiceal orifice and ileocecal                            valve. The colonoscopy was performed without                            difficulty. The patient tolerated the procedure                            well. The quality of the bowel preparation was                            excellent. The ileocecal valve, appendiceal                            orifice, and rectum were photographed. Scope In: 1:26:28 PM Scope Out: 1:41:42 PM Scope Withdrawal Time: 0 hours 8 minutes 49 seconds  Total Procedure Duration: 0 hours 15 minutes 14 seconds  Findings:                 The digital rectal exam was normal.                           External and internal hemorrhoids were found during                            retroflexion. The hemorrhoids were small.                           The exam was otherwise without abnormality. Complications:            No immediate complications. Estimated Blood Loss:     Estimated blood loss: none. Impression:               - Small external and internal hemorrhoids.                           - The examination was  otherwise normal. No polyps                            were found.                           - No specimens collected. Recommendation:           - Patient has a contact number available for                            emergencies. The signs and symptoms of potential                            delayed complications were discussed with the                            patient. Return to normal activities tomorrow.                            Written discharge instructions were provided to the                            patient.                           - Resume previous diet.                           - Continue present medications.                           - Repeat colonoscopy in 10 years for screening                            purposes. Jerene Bears, MD 07/13/2016 1:44:56 PM This report has been  signed electronically.

## 2016-07-14 ENCOUNTER — Telehealth: Payer: Self-pay

## 2016-07-14 NOTE — Telephone Encounter (Signed)
  Follow up Call-  Call back number 07/13/2016  Post procedure Call Back phone  # [phone]  Permission to leave phone message Yes     Patient questions:  Do you have a fever, pain , or abdominal swelling? No. Pain Score  0 *  Have you tolerated food without any problems? Yes.    Have you been able to return to your normal activities? Yes.    Do you have any questions about your discharge instructions: Diet   No. Medications  No. Follow up visit  No.  Do you have questions or concerns about your Care? No.  Actions: * If pain score is 4 or above: No action needed, pain <4.

## 2016-09-03 ENCOUNTER — Encounter: Payer: Self-pay | Admitting: Cardiology

## 2016-09-03 ENCOUNTER — Ambulatory Visit (INDEPENDENT_AMBULATORY_CARE_PROVIDER_SITE_OTHER): Payer: Medicare Other | Admitting: Cardiology

## 2016-09-03 VITALS — BP 126/82 | HR 55 | Ht 63.75 in | Wt 131.0 lb

## 2016-09-03 DIAGNOSIS — I10 Essential (primary) hypertension: Secondary | ICD-10-CM

## 2016-09-03 DIAGNOSIS — I351 Nonrheumatic aortic (valve) insufficiency: Secondary | ICD-10-CM | POA: Diagnosis not present

## 2016-09-03 NOTE — Patient Instructions (Signed)
Medication Instructions:  Your physician recommends that you continue on your current medications as directed. Please refer to the Current Medication list given to you today.   Labwork: We will contact Dr. Raul Del office to see if they have checked a lipid and liver profile and ask them to send to [place].  If not checked in Dr. Raul Del office we will contact you to have lab work done here.  Testing/Procedures: Your physician has requested that you have an echocardiogram. Echocardiography is a painless test that uses sound waves to create images of your heart. It provides your doctor with information about the size and shape of your heart and how well your heart's chambers and valves are working. This procedure takes approximately one hour. There are no restrictions for this procedure.    Follow-Up: Your physician wants you to follow-up in: 12 months with Dr. Aundra Dubin in [place] clinic.  You will receive a reminder letter in the mail two months in advance. If you don't receive a letter, please call our office to schedule the follow-up appointment.   Any Other Special Instructions Will Be Listed Below (If Applicable).     If you need a refill on your cardiac medications before your next appointment, please call your pharmacy.

## 2016-09-05 NOTE — Progress Notes (Addendum)
PCP: Dr. Brigitte Pulse  70 yo with history of HTN, aortic insufficiency and prior melanoma presents for cardiology evaluation.  She has been seen by Dr. Mare Ferrari in the past and sees me for the first time today.  She is doing well symptomatically.  No exertional dyspnea or chest pain.  Rare palpitations.  No BRBPR or melena. BP is controlled, SBP in 120s when she checks at home.  Had mild to moderate aortic insufficiency on last echo in 2016.    ECG: NSR, septal Qs  Labs (5/16): K 4, creatinine 0.61  PMH: 1. Venous insufficiency. 2. HTN 3. H/o melanoma. 4. Migraines 5. Psoriasis 6. Aortic insufficiency: Echo (4/16) with EF 55-60%, mild to moderate AI, trileaflet aortic valve.   SH: Married, 5 children, nonsmoker, lives in [place].  FH: Mother and sister with atrial fibrillation, brother with CAD.  ROS: All systems reviewed and negative except as per HPI.   Current Outpatient Prescriptions  Medication Sig Dispense Refill  . Apremilast (OTEZLA) 30 MG TABS Take 60 mg by mouth daily.     Marland Kitchen aspirin EC 81 MG tablet Take 81 mg by mouth daily.    . Biotin 5 MG CAPS Take by mouth daily.    Marland Kitchen FLUOCINOLONE ACETONIDE SCALP 0.01 % OIL Apply 1 application topically as needed (skin irritation).     Marland Kitchen losartan (COZAAR) 25 MG tablet Take 1 tablet (25 mg total) by mouth daily. 90 tablet 2  . methotrexate (RHEUMATREX) 2.5 MG tablet Take 2.5 mg by mouth once a week. Caution:Chemotherapy. Protect from light.    . Naproxen Sodium (ALEVE PO) Take 220 mg by mouth as needed (pain).     . triamcinolone cream (KENALOG) 0.1 % Apply 1 application topically as needed (skin irritation).     . Vitamin D, Ergocalciferol, (DRISDOL) 50000 units CAPS capsule Take 50,000 Units by mouth every 7 (seven) days.     No current facility-administered medications for this visit.    BP 126/82   Pulse (!) 55   Ht 5' 3.75" (1.619 m)   Wt 131 lb (59.4 kg)   BMI 22.66 kg/m  General: NAD Neck: No JVD, no thyromegaly or thyroid  nodule.  Lungs: Clear to auscultation bilaterally with normal respiratory effort. CV: Nondisplaced PMI.  Heart regular S1/S2, no S3/S4, 1/6 SEM RUSB.  No peripheral edema.  No carotid bruit.  Normal pedal pulses.  Abdomen: Soft, nontender, no hepatosplenomegaly, no distention.  Skin: Intact without lesions or rashes.  Neurologic: Alert and oriented x 3.  Psych: Normal affect. Extremities: No clubbing or cyanosis.  HEENT: Normal.   Assessment/Plan: 1. HTN: BP is well-controlled on low dose losartan.  2. Aortic insufficiency: Mild to moderate on last echo, trileaflet aortic valve.  I will get a repeat echo to assess for progression.  If no significant progression, likely can do echo every 2 years.   3. I will call for her labs that were done earlier this year at PCP's office, including lipids.   She will followup with [facility] in a year [facility].   Loralie Champagne 09/05/16

## 2016-09-09 DIAGNOSIS — Z85820 Personal history of malignant melanoma of skin: Secondary | ICD-10-CM | POA: Diagnosis not present

## 2016-09-09 DIAGNOSIS — Z79899 Other long term (current) drug therapy: Secondary | ICD-10-CM | POA: Diagnosis not present

## 2016-09-09 DIAGNOSIS — Z85828 Personal history of other malignant neoplasm of skin: Secondary | ICD-10-CM | POA: Diagnosis not present

## 2016-09-09 DIAGNOSIS — L400 Psoriasis vulgaris: Secondary | ICD-10-CM | POA: Diagnosis not present

## 2016-09-10 ENCOUNTER — Telehealth: Payer: Self-pay | Admitting: Cardiology

## 2016-09-10 NOTE — Telephone Encounter (Signed)
Discussed lab results recently reviewed by Dr Aundra Dubin with pt.

## 2016-09-10 NOTE — Telephone Encounter (Signed)
New message ° ° ° ° °Pt returning nurse call. Please call.  °

## 2016-09-22 ENCOUNTER — Other Ambulatory Visit: Payer: Self-pay

## 2016-09-22 ENCOUNTER — Encounter (INDEPENDENT_AMBULATORY_CARE_PROVIDER_SITE_OTHER): Payer: Self-pay

## 2016-09-22 ENCOUNTER — Ambulatory Visit (HOSPITAL_COMMUNITY): Payer: Medicare Other | Attending: Cardiovascular Disease

## 2016-09-22 DIAGNOSIS — I517 Cardiomegaly: Secondary | ICD-10-CM | POA: Diagnosis not present

## 2016-09-22 DIAGNOSIS — I872 Venous insufficiency (chronic) (peripheral): Secondary | ICD-10-CM | POA: Insufficient documentation

## 2016-09-22 DIAGNOSIS — I351 Nonrheumatic aortic (valve) insufficiency: Secondary | ICD-10-CM

## 2016-09-22 DIAGNOSIS — I359 Nonrheumatic aortic valve disorder, unspecified: Secondary | ICD-10-CM | POA: Diagnosis present

## 2016-09-22 DIAGNOSIS — I340 Nonrheumatic mitral (valve) insufficiency: Secondary | ICD-10-CM | POA: Diagnosis not present

## 2016-10-11 DIAGNOSIS — L400 Psoriasis vulgaris: Secondary | ICD-10-CM | POA: Diagnosis not present

## 2016-10-11 DIAGNOSIS — Z79899 Other long term (current) drug therapy: Secondary | ICD-10-CM | POA: Diagnosis not present

## 2016-10-11 DIAGNOSIS — L401 Generalized pustular psoriasis: Secondary | ICD-10-CM | POA: Diagnosis not present

## 2016-11-16 DIAGNOSIS — M8589 Other specified disorders of bone density and structure, multiple sites: Secondary | ICD-10-CM | POA: Diagnosis not present

## 2016-11-16 DIAGNOSIS — H5211 Myopia, right eye: Secondary | ICD-10-CM | POA: Diagnosis not present

## 2016-11-16 DIAGNOSIS — Z1231 Encounter for screening mammogram for malignant neoplasm of breast: Secondary | ICD-10-CM | POA: Diagnosis not present

## 2016-11-16 DIAGNOSIS — H02831 Dermatochalasis of right upper eyelid: Secondary | ICD-10-CM | POA: Diagnosis not present

## 2016-11-16 DIAGNOSIS — H02834 Dermatochalasis of left upper eyelid: Secondary | ICD-10-CM | POA: Diagnosis not present

## 2016-11-16 DIAGNOSIS — H2512 Age-related nuclear cataract, left eye: Secondary | ICD-10-CM | POA: Diagnosis not present

## 2016-12-08 ENCOUNTER — Encounter: Payer: Self-pay | Admitting: Certified Nurse Midwife

## 2016-12-09 DIAGNOSIS — L400 Psoriasis vulgaris: Secondary | ICD-10-CM | POA: Diagnosis not present

## 2016-12-09 DIAGNOSIS — Z79899 Other long term (current) drug therapy: Secondary | ICD-10-CM | POA: Diagnosis not present

## 2016-12-09 DIAGNOSIS — Z85820 Personal history of malignant melanoma of skin: Secondary | ICD-10-CM | POA: Diagnosis not present

## 2016-12-09 DIAGNOSIS — Z85828 Personal history of other malignant neoplasm of skin: Secondary | ICD-10-CM | POA: Diagnosis not present

## 2017-03-24 DIAGNOSIS — L400 Psoriasis vulgaris: Secondary | ICD-10-CM | POA: Diagnosis not present

## 2017-03-24 DIAGNOSIS — Z85828 Personal history of other malignant neoplasm of skin: Secondary | ICD-10-CM | POA: Diagnosis not present

## 2017-03-24 DIAGNOSIS — Z85820 Personal history of malignant melanoma of skin: Secondary | ICD-10-CM | POA: Diagnosis not present

## 2017-03-24 DIAGNOSIS — L821 Other seborrheic keratosis: Secondary | ICD-10-CM | POA: Diagnosis not present

## 2017-05-19 ENCOUNTER — Ambulatory Visit: Payer: Medicare Other | Admitting: Certified Nurse Midwife

## 2017-05-24 DIAGNOSIS — I10 Essential (primary) hypertension: Secondary | ICD-10-CM | POA: Diagnosis not present

## 2017-05-24 DIAGNOSIS — M859 Disorder of bone density and structure, unspecified: Secondary | ICD-10-CM | POA: Diagnosis not present

## 2017-06-01 DIAGNOSIS — Z23 Encounter for immunization: Secondary | ICD-10-CM | POA: Diagnosis not present

## 2017-06-01 DIAGNOSIS — M19041 Primary osteoarthritis, right hand: Secondary | ICD-10-CM | POA: Diagnosis not present

## 2017-06-01 DIAGNOSIS — I10 Essential (primary) hypertension: Secondary | ICD-10-CM | POA: Diagnosis not present

## 2017-06-01 DIAGNOSIS — L408 Other psoriasis: Secondary | ICD-10-CM | POA: Diagnosis not present

## 2017-06-01 DIAGNOSIS — I8390 Asymptomatic varicose veins of unspecified lower extremity: Secondary | ICD-10-CM | POA: Diagnosis not present

## 2017-06-01 DIAGNOSIS — E559 Vitamin D deficiency, unspecified: Secondary | ICD-10-CM | POA: Diagnosis not present

## 2017-06-01 DIAGNOSIS — Z85820 Personal history of malignant melanoma of skin: Secondary | ICD-10-CM | POA: Diagnosis not present

## 2017-06-01 DIAGNOSIS — Z6823 Body mass index (BMI) 23.0-23.9, adult: Secondary | ICD-10-CM | POA: Diagnosis not present

## 2017-06-01 DIAGNOSIS — M859 Disorder of bone density and structure, unspecified: Secondary | ICD-10-CM | POA: Diagnosis not present

## 2017-06-01 DIAGNOSIS — Z0000 Encounter for general adult medical examination without abnormal findings: Secondary | ICD-10-CM | POA: Diagnosis not present

## 2017-06-01 DIAGNOSIS — Z1389 Encounter for screening for other disorder: Secondary | ICD-10-CM | POA: Diagnosis not present

## 2017-06-10 ENCOUNTER — Telehealth: Payer: Self-pay

## 2017-06-10 NOTE — Telephone Encounter (Signed)
FAXED NOTES TO [facility]

## 2017-06-21 ENCOUNTER — Telehealth: Payer: Self-pay

## 2017-06-21 NOTE — Telephone Encounter (Signed)
FAXED NOTES TO [facility]

## 2017-07-06 ENCOUNTER — Ambulatory Visit (INDEPENDENT_AMBULATORY_CARE_PROVIDER_SITE_OTHER): Payer: Medicare Other | Admitting: Certified Nurse Midwife

## 2017-07-06 ENCOUNTER — Encounter: Payer: Self-pay | Admitting: Certified Nurse Midwife

## 2017-07-06 ENCOUNTER — Ambulatory Visit: Payer: Medicare Other | Admitting: Certified Nurse Midwife

## 2017-07-06 ENCOUNTER — Other Ambulatory Visit (HOSPITAL_COMMUNITY)
Admission: RE | Admit: 2017-07-06 | Discharge: 2017-07-06 | Disposition: A | Discharge status: Home / Self Care | Payer: Medicare Other | Source: Ambulatory Visit | Attending: Obstetrics & Gynecology | Admitting: Obstetrics & Gynecology

## 2017-07-06 VITALS — BP 108/60 | HR 68 | Resp 16 | Ht 63.25 in | Wt 134.0 lb

## 2017-07-06 DIAGNOSIS — Z01419 Encounter for gynecological examination (general) (routine) without abnormal findings: Secondary | ICD-10-CM

## 2017-07-06 DIAGNOSIS — Z124 Encounter for screening for malignant neoplasm of cervix: Secondary | ICD-10-CM | POA: Insufficient documentation

## 2017-07-06 NOTE — Patient Instructions (Signed)

## 2017-07-06 NOTE — Progress Notes (Signed)
71 y.o. [id number] Married  Caucasian Fe here for annual exam. Menopausal no HRT. Denies vaginal bleeding. Not sexually active. Sees Dr. Brigitte Pulse yearly for medication management of Hypertension and Vitamin D deficiency, aex/labs. All normal per patient. [place] Nail working well for skin psoriasis issues now. Stays active, eats well, no issues today. 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 Recent trip to [place]!  Patient's last menstrual period was 12/28/1999.          Sexually active: No.  The current method of family planning is tubal ligation.    Exercising: Yes.    workout Smoker:  no  Health Maintenance: Pap:  05-06-15 neg History of Abnormal Pap: no MMG:  11-16-16 category b density birads 1:neg Self Breast exams: no Colonoscopy:  2017 neg per patient BMD:   2017 normal TDaP:  2016 Shingles: 3-4 yrs ago Pneumonia: had done Hep C and HIV: hep c neg per patient Labs: none   reports that she has never smoked. She has never used smokeless tobacco. She reports that she does not use drugs.  Past Medical History:  Diagnosis Date  . Basal cell cancer    right leg  . Heart murmur   . Hemorrhoids   . Melanoma ([place]) 2005   on left arm  . Migraines    with aura  . Post-operative nausea and vomiting    after 2 surgeries  . Psoriasis   . Vaginal delivery    times 6    Past Surgical History:  Procedure Laterality Date  . basal cell CA removal    . BUNIONECTOMY Right   . fallopian tube removal     laparoscopic bso- fibroma  . melanoma removal     left upper arm  . ovaries removed     laparoscopic bso- fibroma  . [place]  . WISDOM TOOTH EXTRACTION      Current Outpatient Prescriptions  Medication Sig Dispense Refill  . Apremilast (OTEZLA) 30 MG TABS Take 60 mg by mouth daily.     Marland Kitchen aspirin EC 81 MG tablet Take 81 mg by mouth as needed.     Marland Kitchen losartan (COZAAR) 25 MG tablet Take 1 tablet (25 mg total) by mouth daily. 90 tablet 2  . Naproxen  Sodium (ALEVE PO) Take 220 mg by mouth as needed (pain).     . Vitamin D, Ergocalciferol, (DRISDOL) 50000 units CAPS capsule Take 50,000 Units by mouth every 7 (seven) days.     No current facility-administered medications for this visit.     Family History  Problem Relation Age of Onset  . Heart failure Father   . Heart disease Father        CVD  . Melanoma Mother   . Hypertension Mother   . Heart disease Mother        CVD  . Depression Sister   . Melanoma Sister   . Kidney failure Sister   . Heart disease Brother        stint  . Atrial fibrillation Sister     ROS:  Pertinent items are noted in HPI.  Otherwise, a comprehensive ROS was negative.  Exam:   BP 108/60   Pulse 68   Resp 16   Ht 5' 3.25" (1.607 m)   Wt 134 lb (60.8 kg)   LMP 12/28/1999   BMI 23.55 kg/m  Height: 5' 3.25" (160.7 cm) Ht Readings from Last 3 Encounters:  07/06/17 5'  3.25" (1.607 m)  09/03/16 5' 3.75" (1.619 m)  07/13/16 5' 3.75" (1.619 m)    General appearance: alert, cooperative and appears stated age Head: Normocephalic, without obvious abnormality, atraumatic Neck: no adenopathy, supple, symmetrical, trachea midline and thyroid normal to inspection and palpation Lungs: clear to auscultation bilaterally Breasts: normal appearance, no masses or tenderness, No nipple retraction or dimpling, No nipple discharge or bleeding, No axillary or supraclavicular adenopathy Heart: regular rate and rhythm Abdomen: soft, non-tender; no masses,  no organomegaly Extremities: extremities normal, atraumatic, no cyanosis or edema Skin: Skin color, texture, turgor normal. No rashes or lesions Lymph nodes: Cervical, supraclavicular, and axillary nodes normal. No abnormal inguinal nodes palpated Neurologic: Grossly normal   Pelvic: External genitalia:  no lesions              Urethra:  normal appearing urethra with no masses, tenderness or lesions              Bartholin's and Skene's: normal                  Vagina: normal appearing vagina with normal color and discharge, no lesions              Cervix: multiparous appearance, no cervical motion tenderness and no lesions              Pap taken: Yes.   Bimanual Exam:  Uterus:  normal size, contour, position, consistency, mobility, non-tender              Adnexa: normal adnexa and no mass, fullness, tenderness               Rectovaginal: Confirms               Anus:  normal sphincter tone, no lesions  Chaperone present: yes  A:  Well Woman with normal exam  Menopausal no HRT  Atrophic vaginitis with coconut oil use prn  Hypertension with PCP management  P:   Reviewed health and wellness pertinent to exam  Aware of need to advise if vaginal bleeding  Discussed continue use of coconut oil as needed  Continue follow up with PCP as indicated  Pap smear: yes   counseled on breast self exam, mammography screening, adequate intake of calcium and vitamin D, diet and exercise  return annually or prn  An After Visit Summary was printed and given to the patient.

## 2017-07-06 NOTE — Progress Notes (Deleted)
71 y.o. [id number] Married  Caucasian Fe here for annual exam.    Patient's last menstrual period was 12/28/1999.          Sexually active: No.  The current method of family planning is tubal ligation.    Exercising: Yes.    workout Smoker:  no  Health Maintenance: Pap:  05-06-15 neg History of Abnormal Pap: no MMG:  11-16-16 category b density birads 1:neg Self Breast exams: no Colonoscopy:  2017 neg per patient BMD:   2017 TDaP:  2016 Shingles: 3-4 yrs ago Pneumonia: had done Hep C and HIV: hep c neg per patient Labs: none   reports that she has never smoked. She has never used smokeless tobacco. She reports that she does not use drugs.  Past Medical History:  Diagnosis Date  . Basal cell cancer    right leg  . Heart murmur   . Hemorrhoids   . Melanoma ([place]) 2005   on left arm  . Migraines    with aura  . Post-operative nausea and vomiting    after 2 surgeries  . Psoriasis   . Vaginal delivery    times 6    Past Surgical History:  Procedure Laterality Date  . basal cell CA removal    . BUNIONECTOMY Right   . fallopian tube removal     laparoscopic bso- fibroma  . melanoma removal     left upper arm  . ovaries removed     laparoscopic bso- fibroma  . [place]  . WISDOM TOOTH EXTRACTION      Current Outpatient Prescriptions  Medication Sig Dispense Refill  . Apremilast (OTEZLA) 30 MG TABS Take 60 mg by mouth daily.     Marland Kitchen aspirin EC 81 MG tablet Take 81 mg by mouth as needed.     Marland Kitchen losartan (COZAAR) 25 MG tablet Take 1 tablet (25 mg total) by mouth daily. 90 tablet 2  . Naproxen Sodium (ALEVE PO) Take 220 mg by mouth as needed (pain).     . Vitamin D, Ergocalciferol, (DRISDOL) 50000 units CAPS capsule Take 50,000 Units by mouth every 7 (seven) days.     No current facility-administered medications for this visit.     Family History  Problem Relation Age of Onset  . Heart failure Father   . Heart disease Father         CVD  . Melanoma Mother   . Hypertension Mother   . Heart disease Mother        CVD  . Depression Sister   . Melanoma Sister   . Kidney failure Sister   . Heart disease Brother        stint  . Atrial fibrillation Sister     ROS:  Pertinent items are noted in HPI.  Otherwise, a comprehensive ROS was negative.  Exam:   BP 108/60   Pulse 68   Resp 16   Ht 5' 3.25" (1.607 m)   Wt 134 lb (60.8 kg)   LMP 12/28/1999   BMI 23.55 kg/m  Height: 5' 3.25" (160.7 cm) Ht Readings from Last 3 Encounters:  07/06/17 5' 3.25" (1.607 m)  09/03/16 5' 3.75" (1.619 m)  07/13/16 5' 3.75" (1.619 m)    General appearance: alert, cooperative and appears stated age Head: Normocephalic, without obvious abnormality, atraumatic Neck: no adenopathy, supple, symmetrical, trachea midline and thyroid {EXAM; THYROID:18604} Lungs: clear to auscultation bilaterally Breasts: {Exam; breast:13139::"normal  appearance, no masses or tenderness"} Heart: regular rate and rhythm Abdomen: soft, non-tender; no masses,  no organomegaly Extremities: extremities normal, atraumatic, no cyanosis or edema Skin: Skin color, texture, turgor normal. No rashes or lesions Lymph nodes: Cervical, supraclavicular, and axillary nodes normal. No abnormal inguinal nodes palpated Neurologic: Grossly normal   Pelvic: External genitalia:  no lesions              Urethra:  normal appearing urethra with no masses, tenderness or lesions              Bartholin's and Skene's: normal                 Vagina: normal appearing vagina with normal color and discharge, no lesions              Cervix: {exam; cervix:14595}              Pap taken: {yes no:314532} Bimanual Exam:  Uterus:  {exam; uterus:12215}              Adnexa: {exam; adnexa:12223}               Rectovaginal: Confirms               Anus:  normal sphincter tone, no lesions  Chaperone present: ***  A:  Well Woman with normal exam  P:   Reviewed health and wellness  pertinent to exam  Pap smear: {YES NO:22349}  {plan; gyn:5269::"mammogram","pap smear","return annually or prn"}  An After Visit Summary was printed and given to the patient.

## 2017-07-07 LAB — CYTOLOGY - PAP: DIAGNOSIS: NEGATIVE

## 2017-07-08 ENCOUNTER — Ambulatory Visit: Payer: Medicare Other | Admitting: Certified Nurse Midwife

## 2017-09-29 DIAGNOSIS — L812 Freckles: Secondary | ICD-10-CM | POA: Diagnosis not present

## 2017-09-29 DIAGNOSIS — L821 Other seborrheic keratosis: Secondary | ICD-10-CM | POA: Diagnosis not present

## 2017-09-29 DIAGNOSIS — Z85820 Personal history of malignant melanoma of skin: Secondary | ICD-10-CM | POA: Diagnosis not present

## 2017-09-29 DIAGNOSIS — L400 Psoriasis vulgaris: Secondary | ICD-10-CM | POA: Diagnosis not present

## 2017-09-29 DIAGNOSIS — Z85828 Personal history of other malignant neoplasm of skin: Secondary | ICD-10-CM | POA: Diagnosis not present

## 2017-10-11 DIAGNOSIS — D692 Other nonthrombocytopenic purpura: Secondary | ICD-10-CM | POA: Diagnosis not present

## 2017-10-11 DIAGNOSIS — D225 Melanocytic nevi of trunk: Secondary | ICD-10-CM | POA: Diagnosis not present

## 2017-10-11 DIAGNOSIS — D485 Neoplasm of uncertain behavior of skin: Secondary | ICD-10-CM | POA: Diagnosis not present

## 2017-10-11 DIAGNOSIS — L400 Psoriasis vulgaris: Secondary | ICD-10-CM | POA: Diagnosis not present

## 2017-10-11 DIAGNOSIS — L821 Other seborrheic keratosis: Secondary | ICD-10-CM | POA: Diagnosis not present

## 2017-10-11 DIAGNOSIS — Z85820 Personal history of malignant melanoma of skin: Secondary | ICD-10-CM | POA: Diagnosis not present

## 2017-10-11 DIAGNOSIS — D1801 Hemangioma of skin and subcutaneous tissue: Secondary | ICD-10-CM | POA: Diagnosis not present

## 2017-10-11 DIAGNOSIS — Z85828 Personal history of other malignant neoplasm of skin: Secondary | ICD-10-CM | POA: Diagnosis not present

## 2017-10-11 DIAGNOSIS — L570 Actinic keratosis: Secondary | ICD-10-CM | POA: Diagnosis not present

## 2017-10-11 DIAGNOSIS — I8392 Asymptomatic varicose veins of left lower extremity: Secondary | ICD-10-CM | POA: Diagnosis not present

## 2017-11-22 DIAGNOSIS — Z1231 Encounter for screening mammogram for malignant neoplasm of breast: Secondary | ICD-10-CM | POA: Diagnosis not present

## 2017-12-12 ENCOUNTER — Encounter: Payer: Self-pay | Admitting: Certified Nurse Midwife

## 2018-03-30 DIAGNOSIS — Z85820 Personal history of malignant melanoma of skin: Secondary | ICD-10-CM | POA: Diagnosis not present

## 2018-03-30 DIAGNOSIS — L821 Other seborrheic keratosis: Secondary | ICD-10-CM | POA: Diagnosis not present

## 2018-03-30 DIAGNOSIS — Z85828 Personal history of other malignant neoplasm of skin: Secondary | ICD-10-CM | POA: Diagnosis not present

## 2018-03-30 DIAGNOSIS — L400 Psoriasis vulgaris: Secondary | ICD-10-CM | POA: Diagnosis not present

## 2018-06-06 DIAGNOSIS — R82998 Other abnormal findings in urine: Secondary | ICD-10-CM | POA: Diagnosis not present

## 2018-06-06 DIAGNOSIS — I10 Essential (primary) hypertension: Secondary | ICD-10-CM | POA: Diagnosis not present

## 2018-06-06 DIAGNOSIS — M859 Disorder of bone density and structure, unspecified: Secondary | ICD-10-CM | POA: Diagnosis not present

## 2018-06-12 DIAGNOSIS — Z1389 Encounter for screening for other disorder: Secondary | ICD-10-CM | POA: Diagnosis not present

## 2018-06-12 DIAGNOSIS — L409 Psoriasis, unspecified: Secondary | ICD-10-CM | POA: Diagnosis not present

## 2018-06-12 DIAGNOSIS — E559 Vitamin D deficiency, unspecified: Secondary | ICD-10-CM | POA: Diagnosis not present

## 2018-06-12 DIAGNOSIS — R030 Elevated blood-pressure reading, without diagnosis of hypertension: Secondary | ICD-10-CM | POA: Diagnosis not present

## 2018-06-12 DIAGNOSIS — I8390 Asymptomatic varicose veins of unspecified lower extremity: Secondary | ICD-10-CM | POA: Diagnosis not present

## 2018-06-12 DIAGNOSIS — M8580 Other specified disorders of bone density and structure, unspecified site: Secondary | ICD-10-CM | POA: Diagnosis not present

## 2018-06-12 DIAGNOSIS — I10 Essential (primary) hypertension: Secondary | ICD-10-CM | POA: Diagnosis not present

## 2018-06-12 DIAGNOSIS — M19041 Primary osteoarthritis, right hand: Secondary | ICD-10-CM | POA: Diagnosis not present

## 2018-06-12 DIAGNOSIS — Z0000 Encounter for general adult medical examination without abnormal findings: Secondary | ICD-10-CM | POA: Diagnosis not present

## 2018-06-12 DIAGNOSIS — Z6823 Body mass index (BMI) 23.0-23.9, adult: Secondary | ICD-10-CM | POA: Diagnosis not present

## 2018-06-12 DIAGNOSIS — Z85820 Personal history of malignant melanoma of skin: Secondary | ICD-10-CM | POA: Diagnosis not present

## 2018-07-12 ENCOUNTER — Ambulatory Visit: Payer: Medicare Other | Admitting: Certified Nurse Midwife

## 2018-10-02 DIAGNOSIS — Z85828 Personal history of other malignant neoplasm of skin: Secondary | ICD-10-CM | POA: Diagnosis not present

## 2018-10-02 DIAGNOSIS — Z85820 Personal history of malignant melanoma of skin: Secondary | ICD-10-CM | POA: Diagnosis not present

## 2018-10-02 DIAGNOSIS — L400 Psoriasis vulgaris: Secondary | ICD-10-CM | POA: Diagnosis not present

## 2018-11-06 DIAGNOSIS — Z79899 Other long term (current) drug therapy: Secondary | ICD-10-CM | POA: Diagnosis not present

## 2018-11-06 DIAGNOSIS — L400 Psoriasis vulgaris: Secondary | ICD-10-CM | POA: Diagnosis not present

## 2018-11-06 DIAGNOSIS — Z85820 Personal history of malignant melanoma of skin: Secondary | ICD-10-CM | POA: Diagnosis not present

## 2018-11-06 DIAGNOSIS — Z85828 Personal history of other malignant neoplasm of skin: Secondary | ICD-10-CM | POA: Diagnosis not present

## 2018-12-07 DIAGNOSIS — Z79899 Other long term (current) drug therapy: Secondary | ICD-10-CM | POA: Diagnosis not present

## 2018-12-07 DIAGNOSIS — Z85828 Personal history of other malignant neoplasm of skin: Secondary | ICD-10-CM | POA: Diagnosis not present

## 2018-12-07 DIAGNOSIS — L400 Psoriasis vulgaris: Secondary | ICD-10-CM | POA: Diagnosis not present

## 2018-12-07 DIAGNOSIS — Z85820 Personal history of malignant melanoma of skin: Secondary | ICD-10-CM | POA: Diagnosis not present

## 2019-01-08 DIAGNOSIS — L570 Actinic keratosis: Secondary | ICD-10-CM | POA: Diagnosis not present

## 2019-01-08 DIAGNOSIS — L814 Other melanin hyperpigmentation: Secondary | ICD-10-CM | POA: Diagnosis not present

## 2019-01-08 DIAGNOSIS — Z85820 Personal history of malignant melanoma of skin: Secondary | ICD-10-CM | POA: Diagnosis not present

## 2019-01-08 DIAGNOSIS — L280 Lichen simplex chronicus: Secondary | ICD-10-CM | POA: Diagnosis not present

## 2019-01-08 DIAGNOSIS — L400 Psoriasis vulgaris: Secondary | ICD-10-CM | POA: Diagnosis not present

## 2019-01-08 DIAGNOSIS — Z85828 Personal history of other malignant neoplasm of skin: Secondary | ICD-10-CM | POA: Diagnosis not present

## 2019-01-08 DIAGNOSIS — D225 Melanocytic nevi of trunk: Secondary | ICD-10-CM | POA: Diagnosis not present

## 2019-01-08 DIAGNOSIS — L821 Other seborrheic keratosis: Secondary | ICD-10-CM | POA: Diagnosis not present

## 2019-01-08 DIAGNOSIS — D224 Melanocytic nevi of scalp and neck: Secondary | ICD-10-CM | POA: Diagnosis not present

## 2019-06-13 DIAGNOSIS — M859 Disorder of bone density and structure, unspecified: Secondary | ICD-10-CM | POA: Diagnosis not present

## 2019-06-13 DIAGNOSIS — R82998 Other abnormal findings in urine: Secondary | ICD-10-CM | POA: Diagnosis not present

## 2019-06-13 DIAGNOSIS — I10 Essential (primary) hypertension: Secondary | ICD-10-CM | POA: Diagnosis not present

## 2019-06-20 DIAGNOSIS — M8580 Other specified disorders of bone density and structure, unspecified site: Secondary | ICD-10-CM | POA: Diagnosis not present

## 2019-06-20 DIAGNOSIS — Z1339 Encounter for screening examination for other mental health and behavioral disorders: Secondary | ICD-10-CM | POA: Diagnosis not present

## 2019-06-20 DIAGNOSIS — I10 Essential (primary) hypertension: Secondary | ICD-10-CM | POA: Diagnosis not present

## 2019-06-20 DIAGNOSIS — Z85820 Personal history of malignant melanoma of skin: Secondary | ICD-10-CM | POA: Diagnosis not present

## 2019-06-20 DIAGNOSIS — L409 Psoriasis, unspecified: Secondary | ICD-10-CM | POA: Diagnosis not present

## 2019-06-20 DIAGNOSIS — Z0000 Encounter for general adult medical examination without abnormal findings: Secondary | ICD-10-CM | POA: Diagnosis not present

## 2019-06-20 DIAGNOSIS — M19041 Primary osteoarthritis, right hand: Secondary | ICD-10-CM | POA: Diagnosis not present

## 2019-06-20 DIAGNOSIS — D849 Immunodeficiency, unspecified: Secondary | ICD-10-CM | POA: Diagnosis not present

## 2019-06-20 DIAGNOSIS — E559 Vitamin D deficiency, unspecified: Secondary | ICD-10-CM | POA: Diagnosis not present

## 2019-06-20 DIAGNOSIS — Z1331 Encounter for screening for depression: Secondary | ICD-10-CM | POA: Diagnosis not present

## 2019-07-25 ENCOUNTER — Other Ambulatory Visit: Payer: Self-pay

## 2019-07-27 ENCOUNTER — Encounter: Payer: Self-pay | Admitting: Certified Nurse Midwife

## 2019-07-27 ENCOUNTER — Other Ambulatory Visit: Payer: Self-pay

## 2019-07-27 ENCOUNTER — Other Ambulatory Visit (HOSPITAL_COMMUNITY)
Admission: RE | Admit: 2019-07-27 | Discharge: 2019-07-27 | Disposition: A | Discharge status: Home / Self Care | Payer: Medicare Other | Source: Ambulatory Visit | Attending: Certified Nurse Midwife | Admitting: Certified Nurse Midwife

## 2019-07-27 ENCOUNTER — Ambulatory Visit (INDEPENDENT_AMBULATORY_CARE_PROVIDER_SITE_OTHER): Payer: Medicare Other | Admitting: Certified Nurse Midwife

## 2019-07-27 VITALS — BP 124/72 | HR 74 | Temp 97.4°F | Resp 16 | Ht 63.5 in | Wt 128.0 lb

## 2019-07-27 DIAGNOSIS — Z01411 Encounter for gynecological examination (general) (routine) with abnormal findings: Secondary | ICD-10-CM | POA: Diagnosis not present

## 2019-07-27 DIAGNOSIS — Z124 Encounter for screening for malignant neoplasm of cervix: Secondary | ICD-10-CM

## 2019-07-27 DIAGNOSIS — Z780 Asymptomatic menopausal state: Secondary | ICD-10-CM | POA: Insufficient documentation

## 2019-07-27 DIAGNOSIS — Z1151 Encounter for screening for human papillomavirus (HPV): Secondary | ICD-10-CM | POA: Insufficient documentation

## 2019-07-27 DIAGNOSIS — N814 Uterovaginal prolapse, unspecified: Secondary | ICD-10-CM

## 2019-07-27 DIAGNOSIS — Z8679 Personal history of other diseases of the circulatory system: Secondary | ICD-10-CM | POA: Diagnosis not present

## 2019-07-27 NOTE — Progress Notes (Signed)
73 y.o. [id number] Married  Caucasian Fe here for annual exam. Menopausal no hot flashes. Patient has noted protrusion from vagina and worried she has cancer. Denies urinary leakage or constipation. Exercises on trampoline daily.  Sees PCP for hypertension, Vitamin D, labs. Sees Rheumatology for methotrexate and RA. All stable. Daughters( previous patients here) have new babies! No other health issues today.  Patient's last menstrual period was 12/28/1999.          Sexually active: No.  The current method of family planning is tubal ligation.    Exercising: Yes.    some Smoker:  no  Review of Systems  Constitutional: Negative.   HENT: Negative.   Eyes: Negative.   Respiratory: Negative.   Cardiovascular: Negative.   Gastrointestinal: Negative.   Genitourinary: Negative.        Plolapse  Musculoskeletal: Negative.   Skin: Negative.   Neurological: Negative.   Endo/Heme/Allergies: Negative.   Psychiatric/Behavioral: Negative.     Health Maintenance: Pap:  07-06-17 neg, 05-06-15 neg History of Abnormal Pap: no MMG:  11-22-17 category b density birads 1:neg Self Breast exams: yes Colonoscopy:  2017 neg per patient BMD:   2017 pcp manages TDaP:  2016 Shingles: had done Pneumonia: had done Hep C and HIV: hep c neg per patient Labs: yes   reports that she has never smoked. She has never used smokeless tobacco. She reports previous alcohol use. She reports that she does not use drugs.  Past Medical History:  Diagnosis Date  . Basal cell cancer    right leg  . Heart murmur   . Hemorrhoids   . Melanoma ([place]) 2005   on left arm  . Migraines    with aura  . Post-operative nausea and vomiting    after 2 surgeries  . Psoriasis   . Vaginal delivery    times 6    Past Surgical History:  Procedure Laterality Date  . basal cell CA removal    . BUNIONECTOMY Right   . fallopian tube removal     laparoscopic bso- fibroma  . melanoma removal     left upper arm  . ovaries removed      laparoscopic bso- fibroma  . [place]  . WISDOM TOOTH EXTRACTION      Current Outpatient Medications  Medication Sig Dispense Refill  . Apremilast (OTEZLA) 30 MG TABS Take 60 mg by mouth daily.     Marland Kitchen FOLIC ACID PO Take by mouth.    . losartan (COZAAR) 25 MG tablet Take 1 tablet (25 mg total) by mouth daily. 90 tablet 2  . methotrexate (RHEUMATREX) 2.5 MG tablet     . Naproxen Sodium (ALEVE PO) Take 220 mg by mouth as needed (pain).     . Vitamin D, Ergocalciferol, (DRISDOL) 50000 units CAPS capsule Take 50,000 Units by mouth every 7 (seven) days.     No current facility-administered medications for this visit.     Family History  Problem Relation Age of Onset  . Heart failure Father   . Heart disease Father        CVD  . Melanoma Mother   . Hypertension Mother   . Heart disease Mother        CVD  . Depression Sister   . Melanoma Sister   . Kidney failure Sister   . Heart disease Brother        stint  . Atrial fibrillation Sister  ROS:  Pertinent items are noted in HPI.  Otherwise, a comprehensive ROS was negative.  Exam:   BP (!) 160/80   Pulse 72   Temp (!) 97.4 F (36.3 C) (Skin)   Resp 16   Ht 5' 3.5" (1.613 m)   Wt 128 lb (58.1 kg)   LMP 12/28/1999   BMI 22.32 kg/m  Height: 5' 3.5" (161.3 cm) Ht Readings from Last 3 Encounters:  07/27/19 5' 3.5" (1.613 m)  07/06/17 5' 3.25" (1.607 m)  09/03/16 5' 3.75" (1.619 m)    General appearance: alert, cooperative and appears stated age Head: Normocephalic, without obvious abnormality, atraumatic Neck: no adenopathy, supple, symmetrical, trachea midline and thyroid normal to inspection and palpation Lungs: clear to auscultation bilaterally Breasts: normal appearance, no masses or tenderness, No nipple retraction or dimpling, No nipple discharge or bleeding, No axillary or supraclavicular adenopathy Heart: regular rate and rhythm Abdomen: soft, non-tender; no  masses,  no organomegaly Extremities: extremities normal, atraumatic, no cyanosis or edema Skin: Skin color, texture, turgor normal. No rashes or lesions Lymph nodes: Cervical, supraclavicular, and axillary nodes normal. No abnormal inguinal nodes palpated Neurologic: Grossly normal   Pelvic: External genitalia:  no lesions              Urethra:  normal appearing urethra with no masses, tenderness or lesions              Bartholin's and Skene's: normal                 Vagina: normal appearing vagina with normal color and discharge, no lesions, grade 1-2 cystocele with cough or bearing down. Vaginal tone good with kegel exercise.              Cervix: multiparous appearance, no cervical motion tenderness and no lesions              Pap taken: Yes.   Bimanual Exam:  Uterus:  normal size, contour, position, consistency, mobility, non-tender and prolapsed first degree              Adnexa: normal adnexa and no mass, fullness, tenderness               Rectovaginal: Confirms               Anus:  normal sphincter tone, no lesions  Chaperone present: yes  A:  Well Woman with normal exam  Post menopausal no HRT  Grade 1 prolapse of uterus with grade 1-2 cystocele with cough only, not symptomatic  Hypertension/cholesterol, RA, Vitamin D with MD management  Mammogram overdue plans to schedule  P:   Reviewed health and wellness pertinent to exam  Aware of need to advise if vaginal bleeding.  Discussed vaginal finding and etiology. Shown area in mirror. Pictures shown from booklet also. Discussed stopping trampoline use, which is increasing relaxation of support in vagina. Work on [employer]. Advise if change or incontinence occurrence. Relieved.  Continue follow up with MD as indicated.  Pap smear: yes  counseled on breast self exam, mammography screening, feminine hygiene, menopause, osteoporosis, adequate intake of calcium and vitamin D, diet and exercise, Kegel's exercises  return annually or  prn  An After Visit Summary was printed and given to the patient.

## 2019-07-27 NOTE — Patient Instructions (Signed)

## 2019-07-31 LAB — CYTOLOGY - PAP
Diagnosis: NEGATIVE
HPV: NOT DETECTED

## 2019-10-04 DIAGNOSIS — Z23 Encounter for immunization: Secondary | ICD-10-CM | POA: Diagnosis not present

## 2019-10-19 ENCOUNTER — Encounter: Payer: Self-pay | Admitting: Certified Nurse Midwife

## 2019-10-19 DIAGNOSIS — Z90710 Acquired absence of both cervix and uterus: Secondary | ICD-10-CM | POA: Diagnosis not present

## 2019-10-19 DIAGNOSIS — M8589 Other specified disorders of bone density and structure, multiple sites: Secondary | ICD-10-CM | POA: Diagnosis not present

## 2019-10-19 DIAGNOSIS — Z1231 Encounter for screening mammogram for malignant neoplasm of breast: Secondary | ICD-10-CM | POA: Diagnosis not present

## 2019-10-19 DIAGNOSIS — R29890 Loss of height: Secondary | ICD-10-CM | POA: Diagnosis not present

## 2019-10-22 DIAGNOSIS — L821 Other seborrheic keratosis: Secondary | ICD-10-CM | POA: Diagnosis not present

## 2019-10-22 DIAGNOSIS — Z85820 Personal history of malignant melanoma of skin: Secondary | ICD-10-CM | POA: Diagnosis not present

## 2019-10-22 DIAGNOSIS — Z85828 Personal history of other malignant neoplasm of skin: Secondary | ICD-10-CM | POA: Diagnosis not present

## 2019-10-22 DIAGNOSIS — L400 Psoriasis vulgaris: Secondary | ICD-10-CM | POA: Diagnosis not present

## 2019-10-26 DIAGNOSIS — Z20828 Contact with and (suspected) exposure to other viral communicable diseases: Secondary | ICD-10-CM | POA: Diagnosis not present

## 2019-11-07 DIAGNOSIS — Z20828 Contact with and (suspected) exposure to other viral communicable diseases: Secondary | ICD-10-CM | POA: Diagnosis not present

## 2020-01-15 ENCOUNTER — Ambulatory Visit: Payer: Medicare Other | Attending: Internal Medicine

## 2020-01-15 DIAGNOSIS — Z23 Encounter for immunization: Secondary | ICD-10-CM | POA: Diagnosis not present

## 2020-01-15 NOTE — Progress Notes (Signed)
   Covid-19 Vaccination Clinic  Name:  Chelsea Barnett    MRN: [id number] DOB: 10-Jun-1946  01/15/2020  [name] was observed post Covid-19 immunization for 15 minutes without incidence. She was provided with Vaccine Information Sheet and instruction to access the V-Safe system.   [name] was instructed to call 911 with any severe reactions post vaccine: Marland Kitchen Difficulty breathing  . Swelling of your face and throat  . A fast heartbeat  . A bad rash all over your body  . Dizziness and weakness    Immunizations Administered    Name Date Dose VIS Date Route   Pfizer COVID-19 Vaccine 01/15/2020  1:42 PM 0.3 mL 12/07/2019 Intramuscular   Manufacturer: [place]   Lot: [id number]   [place]: [id number]

## 2020-01-16 DIAGNOSIS — N812 Incomplete uterovaginal prolapse: Secondary | ICD-10-CM | POA: Insufficient documentation

## 2020-01-16 DIAGNOSIS — N8111 Cystocele, midline: Secondary | ICD-10-CM | POA: Diagnosis not present

## 2020-01-16 DIAGNOSIS — N952 Postmenopausal atrophic vaginitis: Secondary | ICD-10-CM | POA: Diagnosis not present

## 2020-01-31 ENCOUNTER — Ambulatory Visit: Payer: Medicare Other

## 2020-02-04 ENCOUNTER — Ambulatory Visit: Payer: Medicare Other | Attending: Internal Medicine

## 2020-02-04 DIAGNOSIS — Z23 Encounter for immunization: Secondary | ICD-10-CM | POA: Insufficient documentation

## 2020-02-04 NOTE — Progress Notes (Signed)
   Covid-19 Vaccination Clinic  Name:  Chelsea Barnett    MRN: [id number] DOB: [birth year]  02/04/2020  [name] was observed post Covid-19 immunization for 15 minutes without incidence. She was provided with Vaccine Information Sheet and instruction to access the V-Safe system.   [name] was instructed to call 911 with any severe reactions post vaccine: Marland Kitchen Difficulty breathing  . Swelling of your face and throat  . A fast heartbeat  . A bad rash all over your body  . Dizziness and weakness    Immunizations Administered    Name Date Dose VIS Date Route   Pfizer COVID-19 Vaccine 02/04/2020  2:00 PM 0.3 mL 12/07/2019 Intramuscular   Manufacturer: [place]   Lot: [id number]   [place]: [id number]

## 2020-02-11 DIAGNOSIS — D692 Other nonthrombocytopenic purpura: Secondary | ICD-10-CM | POA: Diagnosis not present

## 2020-02-11 DIAGNOSIS — D225 Melanocytic nevi of trunk: Secondary | ICD-10-CM | POA: Diagnosis not present

## 2020-02-11 DIAGNOSIS — L400 Psoriasis vulgaris: Secondary | ICD-10-CM | POA: Diagnosis not present

## 2020-02-11 DIAGNOSIS — L821 Other seborrheic keratosis: Secondary | ICD-10-CM | POA: Diagnosis not present

## 2020-02-11 DIAGNOSIS — Z85828 Personal history of other malignant neoplasm of skin: Secondary | ICD-10-CM | POA: Diagnosis not present

## 2020-02-11 DIAGNOSIS — Z85820 Personal history of malignant melanoma of skin: Secondary | ICD-10-CM | POA: Diagnosis not present

## 2020-02-11 DIAGNOSIS — D1801 Hemangioma of skin and subcutaneous tissue: Secondary | ICD-10-CM | POA: Diagnosis not present

## 2020-02-21 DIAGNOSIS — Z4689 Encounter for fitting and adjustment of other specified devices: Secondary | ICD-10-CM | POA: Diagnosis not present

## 2020-02-21 DIAGNOSIS — N813 Complete uterovaginal prolapse: Secondary | ICD-10-CM | POA: Diagnosis not present

## 2020-02-21 DIAGNOSIS — N952 Postmenopausal atrophic vaginitis: Secondary | ICD-10-CM | POA: Diagnosis not present

## 2020-02-26 DIAGNOSIS — N814 Uterovaginal prolapse, unspecified: Secondary | ICD-10-CM | POA: Diagnosis not present

## 2020-02-26 DIAGNOSIS — N952 Postmenopausal atrophic vaginitis: Secondary | ICD-10-CM | POA: Diagnosis not present

## 2020-02-26 DIAGNOSIS — Z4689 Encounter for fitting and adjustment of other specified devices: Secondary | ICD-10-CM | POA: Diagnosis not present

## 2020-03-18 ENCOUNTER — Encounter: Payer: Self-pay | Admitting: Certified Nurse Midwife

## 2020-04-10 DIAGNOSIS — N952 Postmenopausal atrophic vaginitis: Secondary | ICD-10-CM | POA: Diagnosis not present

## 2020-04-10 DIAGNOSIS — N814 Uterovaginal prolapse, unspecified: Secondary | ICD-10-CM | POA: Diagnosis not present

## 2020-04-10 DIAGNOSIS — Z4689 Encounter for fitting and adjustment of other specified devices: Secondary | ICD-10-CM | POA: Diagnosis not present

## 2020-05-22 DIAGNOSIS — N952 Postmenopausal atrophic vaginitis: Secondary | ICD-10-CM | POA: Diagnosis not present

## 2020-05-22 DIAGNOSIS — N814 Uterovaginal prolapse, unspecified: Secondary | ICD-10-CM | POA: Diagnosis not present

## 2020-05-22 DIAGNOSIS — Z4689 Encounter for fitting and adjustment of other specified devices: Secondary | ICD-10-CM | POA: Diagnosis not present

## 2020-06-04 DIAGNOSIS — N812 Incomplete uterovaginal prolapse: Secondary | ICD-10-CM | POA: Diagnosis not present

## 2020-06-04 DIAGNOSIS — N8111 Cystocele, midline: Secondary | ICD-10-CM | POA: Diagnosis not present

## 2020-06-04 DIAGNOSIS — N816 Rectocele: Secondary | ICD-10-CM | POA: Diagnosis not present

## 2020-06-06 DIAGNOSIS — N816 Rectocele: Secondary | ICD-10-CM | POA: Insufficient documentation

## 2020-07-07 DIAGNOSIS — I10 Essential (primary) hypertension: Secondary | ICD-10-CM | POA: Diagnosis not present

## 2020-07-07 DIAGNOSIS — M859 Disorder of bone density and structure, unspecified: Secondary | ICD-10-CM | POA: Diagnosis not present

## 2020-07-14 DIAGNOSIS — M19041 Primary osteoarthritis, right hand: Secondary | ICD-10-CM | POA: Diagnosis not present

## 2020-07-14 DIAGNOSIS — N329 Bladder disorder, unspecified: Secondary | ICD-10-CM | POA: Diagnosis not present

## 2020-07-14 DIAGNOSIS — D849 Immunodeficiency, unspecified: Secondary | ICD-10-CM | POA: Diagnosis not present

## 2020-07-14 DIAGNOSIS — M8580 Other specified disorders of bone density and structure, unspecified site: Secondary | ICD-10-CM | POA: Diagnosis not present

## 2020-07-14 DIAGNOSIS — L409 Psoriasis, unspecified: Secondary | ICD-10-CM | POA: Diagnosis not present

## 2020-07-14 DIAGNOSIS — D692 Other nonthrombocytopenic purpura: Secondary | ICD-10-CM | POA: Diagnosis not present

## 2020-07-14 DIAGNOSIS — I10 Essential (primary) hypertension: Secondary | ICD-10-CM | POA: Diagnosis not present

## 2020-07-14 DIAGNOSIS — Z85820 Personal history of malignant melanoma of skin: Secondary | ICD-10-CM | POA: Diagnosis not present

## 2020-07-14 DIAGNOSIS — E559 Vitamin D deficiency, unspecified: Secondary | ICD-10-CM | POA: Diagnosis not present

## 2020-07-14 DIAGNOSIS — Z1331 Encounter for screening for depression: Secondary | ICD-10-CM | POA: Diagnosis not present

## 2020-07-14 DIAGNOSIS — Z0000 Encounter for general adult medical examination without abnormal findings: Secondary | ICD-10-CM | POA: Diagnosis not present

## 2020-07-23 DIAGNOSIS — I10 Essential (primary) hypertension: Secondary | ICD-10-CM | POA: Diagnosis not present

## 2020-07-23 DIAGNOSIS — N398 Other specified disorders of urinary system: Secondary | ICD-10-CM | POA: Diagnosis not present

## 2020-07-23 DIAGNOSIS — N812 Incomplete uterovaginal prolapse: Secondary | ICD-10-CM | POA: Diagnosis not present

## 2020-07-28 DIAGNOSIS — N398 Other specified disorders of urinary system: Secondary | ICD-10-CM | POA: Diagnosis not present

## 2020-07-28 DIAGNOSIS — N812 Incomplete uterovaginal prolapse: Secondary | ICD-10-CM | POA: Diagnosis not present

## 2020-07-28 DIAGNOSIS — N816 Rectocele: Secondary | ICD-10-CM | POA: Diagnosis not present

## 2020-07-28 DIAGNOSIS — N925 Other specified irregular menstruation: Secondary | ICD-10-CM | POA: Diagnosis not present

## 2020-07-28 DIAGNOSIS — N8111 Cystocele, midline: Secondary | ICD-10-CM | POA: Diagnosis not present

## 2020-08-04 ENCOUNTER — Ambulatory Visit: Payer: Medicare Other | Admitting: Certified Nurse Midwife

## 2020-08-08 ENCOUNTER — Other Ambulatory Visit: Payer: Self-pay

## 2020-08-08 ENCOUNTER — Ambulatory Visit (HOSPITAL_COMMUNITY)
Admission: RE | Admit: 2020-08-08 | Discharge: 2020-08-08 | Disposition: A | Discharge status: Home / Self Care | Payer: Medicare Other | Source: Ambulatory Visit | Attending: Cardiology | Admitting: Cardiology

## 2020-08-08 ENCOUNTER — Encounter (HOSPITAL_COMMUNITY): Payer: Self-pay | Admitting: Cardiology

## 2020-08-08 VITALS — BP 130/86 | HR 61 | Wt 131.4 lb

## 2020-08-08 DIAGNOSIS — Z791 Long term (current) use of non-steroidal anti-inflammatories (NSAID): Secondary | ICD-10-CM | POA: Insufficient documentation

## 2020-08-08 DIAGNOSIS — I872 Venous insufficiency (chronic) (peripheral): Secondary | ICD-10-CM | POA: Diagnosis not present

## 2020-08-08 DIAGNOSIS — L409 Psoriasis, unspecified: Secondary | ICD-10-CM | POA: Insufficient documentation

## 2020-08-08 DIAGNOSIS — Z85820 Personal history of malignant melanoma of skin: Secondary | ICD-10-CM | POA: Diagnosis not present

## 2020-08-08 DIAGNOSIS — I351 Nonrheumatic aortic (valve) insufficiency: Secondary | ICD-10-CM | POA: Insufficient documentation

## 2020-08-08 DIAGNOSIS — E785 Hyperlipidemia, unspecified: Secondary | ICD-10-CM | POA: Insufficient documentation

## 2020-08-08 DIAGNOSIS — I10 Essential (primary) hypertension: Secondary | ICD-10-CM | POA: Insufficient documentation

## 2020-08-08 DIAGNOSIS — Z79899 Other long term (current) drug therapy: Secondary | ICD-10-CM | POA: Insufficient documentation

## 2020-08-08 DIAGNOSIS — R011 Cardiac murmur, unspecified: Secondary | ICD-10-CM | POA: Diagnosis not present

## 2020-08-08 DIAGNOSIS — Z8249 Family history of ischemic heart disease and other diseases of the circulatory system: Secondary | ICD-10-CM | POA: Diagnosis not present

## 2020-08-08 DIAGNOSIS — Z79818 Long term (current) use of other agents affecting estrogen receptors and estrogen levels: Secondary | ICD-10-CM | POA: Insufficient documentation

## 2020-08-08 NOTE — Patient Instructions (Addendum)
Your physician has requested that you have an echocardiogram. Echocardiography is a painless test that uses sound waves to create images of your heart. It provides your doctor with information about the size and shape of your heart and how well your heart's chambers and valves are working. This procedure takes approximately one hour. There are no restrictions for this procedure.   Call our office in 1 year (August 2022) for your follow up appointment   If you have any questions or concerns before your next appointment please send [place] a message through [place] or call our office at [phone].    TO LEAVE A MESSAGE FOR THE NURSE SELECT OPTION 2, PLEASE LEAVE A MESSAGE INCLUDING: . YOUR NAME . DATE OF BIRTH . CALL BACK NUMBER . REASON FOR CALL**this is important as we prioritize the call backs  [place] AS LONG AS YOU CALL BEFORE 4:00 PM   At the [facility], you and your health needs are our priority. As part of our continuing mission to provide you with exceptional heart care, we have created designated Provider Care Teams. These Care Teams include your primary Cardiologist (physician) and Advanced Practice Providers (APPs- Physician Assistants and Nurse Practitioners) who all work together to provide you with the care you need, when you need it.   You may see any of the following providers on your designated Care Team at your next follow up: Marland Kitchen Dr Glori Bickers . Dr Loralie Champagne . Darrick Grinder, NP . Lyda Jester, PA . Audry Riles, PharmD   Please be sure to bring in all your medications bottles to every appointment.

## 2020-08-10 NOTE — Progress Notes (Signed)
PCP: Dr. Brigitte Pulse Cardiology: Dr. Aundra Dubin  74 y.o. with history of HTN, aortic insufficiency and prior melanoma presents for followup of HTN, valvular heart disease.  She has had mild-moderate aortic insufficiency on past echoes.    She has been doing well symptomatically.  No exertional dyspnea or chest pain.  No lightheadedness.  No palpitations.  She exercises regularly. SBP 100s-120s when she checks at home.  No ETOH or smoking.   ECG: NSR, poor RWP  Labs (5/16): K 4, creatinine 0.61 Labs (7/21): LDL 123, HDL 85, K 4.1, creatinine 0.8, TSH normal, hgb 13  PMH: 1. Venous insufficiency. 2. HTN 3. H/o melanoma. 4. Migraines 5. Psoriasis 6. Aortic insufficiency: Echo (4/16) with EF 55-60%, mild to moderate AI, trileaflet aortic valve.  - Echo (9/17): EF 60-65%, mild-moderate AI, mild MR.   SH: Married, 5 children, nonsmoker, lives in [place].  FH: Mother, father and sister with atrial fibrillation; brother with CAD.  ROS: All systems reviewed and negative except as per HPI.   Current Outpatient Medications  Medication Sig Dispense Refill  . acetaminophen (TYLENOL) 325 MG tablet Take by mouth.    . Apremilast (OTEZLA) 30 MG TABS Take 60 mg by mouth daily.     . clobetasol cream (TEMOVATE) 0.05 %     . estradiol (ESTRACE) 0.1 MG/GM vaginal cream Insert pea-sized amount of cream per vagina every other night    . Fluocinolone Acetonide Body 0.01 % OIL fluocinolone 0.01 % scalp oil and shower cap    . folic acid (FOLVITE) 1 MG tablet Take 1 mg by mouth daily.    Marland Kitchen losartan (COZAAR) 25 MG tablet Take 1 tablet (25 mg total) by mouth daily. 90 tablet 2  . Naproxen Sodium (ALEVE PO) Take 220 mg by mouth as needed (pain).     . triamcinolone cream (KENALOG) 0.1 % Apply topically 2 (two) times daily as needed.    . Vitamin D, Ergocalciferol, (DRISDOL) 50000 units CAPS capsule Take 50,000 Units by mouth every 7 (seven) days.     No current facility-administered medications for this  encounter.   BP 130/86   Pulse 61   Wt 59.6 kg (131 lb 6.4 oz)   LMP 12/28/1999   SpO2 97%   BMI 22.91 kg/m  General: NAD Neck: No JVD, no thyromegaly or thyroid nodule.  Lungs: Clear to auscultation bilaterally with normal respiratory effort. CV: Nondisplaced PMI.  Heart regular S1/S2, no S3/S4, 1/6 SEM RUSB.  No peripheral edema.  No carotid bruit.  Normal pedal pulses.  Abdomen: Soft, nontender, no hepatosplenomegaly, no distention.  Skin: Intact without lesions or rashes.  Neurologic: Alert and oriented x 3.  Psych: Normal affect. Extremities: No clubbing or cyanosis.  HEENT: Normal.   Assessment/Plan: 1. HTN: BP is well-controlled on low dose losartan.  2. Aortic insufficiency: Mild to moderate on last echo in 9/17, trileaflet aortic valve.  I will get a repeat echo to assess for progression.  3. Hyperlipidemia: LDL is mildly elevated but HDL is high.  She does not have risk factors other than HTN.  I think she can continue to work on lifestyle changes for lipid control.  4. Family history of atrial fibrillation: Strong, probably at higher risk than average for AF.  Control BP, not an ETOH drinker.  She will followup with me in a year [facility].   Loralie Champagne 08/10/2020

## 2020-08-11 ENCOUNTER — Ambulatory Visit (HOSPITAL_COMMUNITY)
Admission: RE | Admit: 2020-08-11 | Discharge: 2020-08-11 | Disposition: A | Discharge status: Home / Self Care | Payer: Medicare Other | Source: Ambulatory Visit | Attending: Cardiology | Admitting: Cardiology

## 2020-08-11 ENCOUNTER — Other Ambulatory Visit: Payer: Self-pay

## 2020-08-11 DIAGNOSIS — I351 Nonrheumatic aortic (valve) insufficiency: Secondary | ICD-10-CM | POA: Diagnosis not present

## 2020-08-11 DIAGNOSIS — R011 Cardiac murmur, unspecified: Secondary | ICD-10-CM

## 2020-08-11 DIAGNOSIS — C801 Malignant (primary) neoplasm, unspecified: Secondary | ICD-10-CM | POA: Insufficient documentation

## 2020-08-11 LAB — ECHOCARDIOGRAM COMPLETE
Area-P 1/2: 2.34 cm2
P 1/2 time: 360 msec
S' Lateral: 3.1 cm

## 2020-08-11 NOTE — Progress Notes (Signed)
  Echocardiogram 2D Echocardiogram has been performed.  Chelsea Barnett 08/11/2020, 3:29 PM

## 2020-09-08 DIAGNOSIS — Z23 Encounter for immunization: Secondary | ICD-10-CM | POA: Diagnosis not present

## 2020-10-20 DIAGNOSIS — Z1231 Encounter for screening mammogram for malignant neoplasm of breast: Secondary | ICD-10-CM | POA: Diagnosis not present

## 2020-11-27 DIAGNOSIS — Z23 Encounter for immunization: Secondary | ICD-10-CM | POA: Diagnosis not present

## 2020-12-17 DIAGNOSIS — H5213 Myopia, bilateral: Secondary | ICD-10-CM | POA: Diagnosis not present

## 2020-12-17 DIAGNOSIS — H2513 Age-related nuclear cataract, bilateral: Secondary | ICD-10-CM | POA: Diagnosis not present

## 2021-02-23 DIAGNOSIS — D1801 Hemangioma of skin and subcutaneous tissue: Secondary | ICD-10-CM | POA: Diagnosis not present

## 2021-02-23 DIAGNOSIS — Z85828 Personal history of other malignant neoplasm of skin: Secondary | ICD-10-CM | POA: Diagnosis not present

## 2021-02-23 DIAGNOSIS — L400 Psoriasis vulgaris: Secondary | ICD-10-CM | POA: Diagnosis not present

## 2021-02-23 DIAGNOSIS — Z85820 Personal history of malignant melanoma of skin: Secondary | ICD-10-CM | POA: Diagnosis not present

## 2021-02-23 DIAGNOSIS — D225 Melanocytic nevi of trunk: Secondary | ICD-10-CM | POA: Diagnosis not present

## 2021-02-23 DIAGNOSIS — L821 Other seborrheic keratosis: Secondary | ICD-10-CM | POA: Diagnosis not present

## 2021-02-23 DIAGNOSIS — D692 Other nonthrombocytopenic purpura: Secondary | ICD-10-CM | POA: Diagnosis not present

## 2021-02-23 DIAGNOSIS — L570 Actinic keratosis: Secondary | ICD-10-CM | POA: Diagnosis not present

## 2021-03-24 DIAGNOSIS — I10 Essential (primary) hypertension: Secondary | ICD-10-CM | POA: Diagnosis not present

## 2021-03-24 DIAGNOSIS — G3184 Mild cognitive impairment, so stated: Secondary | ICD-10-CM | POA: Diagnosis not present

## 2021-03-24 DIAGNOSIS — R4789 Other speech disturbances: Secondary | ICD-10-CM | POA: Diagnosis not present

## 2021-03-25 ENCOUNTER — Other Ambulatory Visit (HOSPITAL_COMMUNITY): Payer: Self-pay | Admitting: Internal Medicine

## 2021-03-25 ENCOUNTER — Other Ambulatory Visit: Payer: Self-pay | Admitting: Internal Medicine

## 2021-03-25 DIAGNOSIS — R4789 Other speech disturbances: Secondary | ICD-10-CM

## 2021-03-25 DIAGNOSIS — R202 Paresthesia of skin: Secondary | ICD-10-CM | POA: Diagnosis not present

## 2021-03-25 DIAGNOSIS — G3184 Mild cognitive impairment, so stated: Secondary | ICD-10-CM | POA: Diagnosis not present

## 2021-03-26 ENCOUNTER — Other Ambulatory Visit (HOSPITAL_COMMUNITY): Payer: Self-pay | Admitting: Internal Medicine

## 2021-03-26 ENCOUNTER — Other Ambulatory Visit: Payer: Self-pay

## 2021-03-26 ENCOUNTER — Ambulatory Visit (HOSPITAL_COMMUNITY)
Admission: RE | Admit: 2021-03-26 | Discharge: 2021-03-26 | Disposition: A | Discharge status: Home / Self Care | Payer: Medicare Other | Source: Ambulatory Visit | Attending: Internal Medicine | Admitting: Internal Medicine

## 2021-03-26 DIAGNOSIS — R4789 Other speech disturbances: Secondary | ICD-10-CM

## 2021-03-29 ENCOUNTER — Other Ambulatory Visit: Payer: Self-pay

## 2021-03-29 ENCOUNTER — Ambulatory Visit (HOSPITAL_COMMUNITY)
Admission: RE | Admit: 2021-03-29 | Discharge: 2021-03-29 | Disposition: A | Discharge status: Home / Self Care | Payer: Medicare Other | Source: Ambulatory Visit | Attending: Internal Medicine | Admitting: Internal Medicine

## 2021-03-29 DIAGNOSIS — R4789 Other speech disturbances: Secondary | ICD-10-CM

## 2021-03-29 MED ORDER — GADOBUTROL 1 MMOL/ML IV SOLN
6.0000 mL | Freq: Once | INTRAVENOUS | Status: AC | PRN
  Administered 2021-03-29: 6 mL via INTRAVENOUS

## 2021-06-27 DIAGNOSIS — Z20822 Contact with and (suspected) exposure to covid-19: Secondary | ICD-10-CM | POA: Diagnosis not present

## 2021-07-15 DIAGNOSIS — Z23 Encounter for immunization: Secondary | ICD-10-CM | POA: Diagnosis not present

## 2021-07-27 DIAGNOSIS — Z20822 Contact with and (suspected) exposure to covid-19: Secondary | ICD-10-CM | POA: Diagnosis not present

## 2021-08-05 DIAGNOSIS — I10 Essential (primary) hypertension: Secondary | ICD-10-CM | POA: Diagnosis not present

## 2021-08-05 DIAGNOSIS — E559 Vitamin D deficiency, unspecified: Secondary | ICD-10-CM | POA: Diagnosis not present

## 2021-08-06 DIAGNOSIS — Z1331 Encounter for screening for depression: Secondary | ICD-10-CM | POA: Diagnosis not present

## 2021-08-06 DIAGNOSIS — G3184 Mild cognitive impairment, so stated: Secondary | ICD-10-CM | POA: Diagnosis not present

## 2021-08-06 DIAGNOSIS — R4789 Other speech disturbances: Secondary | ICD-10-CM | POA: Diagnosis not present

## 2021-08-06 DIAGNOSIS — I10 Essential (primary) hypertension: Secondary | ICD-10-CM | POA: Diagnosis not present

## 2021-08-06 DIAGNOSIS — M25552 Pain in left hip: Secondary | ICD-10-CM | POA: Diagnosis not present

## 2021-08-06 DIAGNOSIS — M8580 Other specified disorders of bone density and structure, unspecified site: Secondary | ICD-10-CM | POA: Diagnosis not present

## 2021-08-06 DIAGNOSIS — D849 Immunodeficiency, unspecified: Secondary | ICD-10-CM | POA: Diagnosis not present

## 2021-08-06 DIAGNOSIS — R82998 Other abnormal findings in urine: Secondary | ICD-10-CM | POA: Diagnosis not present

## 2021-08-06 DIAGNOSIS — D692 Other nonthrombocytopenic purpura: Secondary | ICD-10-CM | POA: Diagnosis not present

## 2021-08-06 DIAGNOSIS — Z0000 Encounter for general adult medical examination without abnormal findings: Secondary | ICD-10-CM | POA: Diagnosis not present

## 2021-08-06 DIAGNOSIS — Z1339 Encounter for screening examination for other mental health and behavioral disorders: Secondary | ICD-10-CM | POA: Diagnosis not present

## 2021-08-06 DIAGNOSIS — L409 Psoriasis, unspecified: Secondary | ICD-10-CM | POA: Diagnosis not present

## 2021-09-23 DIAGNOSIS — N398 Other specified disorders of urinary system: Secondary | ICD-10-CM | POA: Diagnosis not present

## 2021-09-23 DIAGNOSIS — Z9889 Other specified postprocedural states: Secondary | ICD-10-CM | POA: Diagnosis not present

## 2021-09-23 DIAGNOSIS — N952 Postmenopausal atrophic vaginitis: Secondary | ICD-10-CM | POA: Diagnosis not present

## 2021-10-17 DIAGNOSIS — Z20822 Contact with and (suspected) exposure to covid-19: Secondary | ICD-10-CM | POA: Diagnosis not present

## 2021-10-20 DIAGNOSIS — L720 Epidermal cyst: Secondary | ICD-10-CM | POA: Diagnosis not present

## 2021-10-20 DIAGNOSIS — Z85828 Personal history of other malignant neoplasm of skin: Secondary | ICD-10-CM | POA: Diagnosis not present

## 2021-10-20 DIAGNOSIS — L905 Scar conditions and fibrosis of skin: Secondary | ICD-10-CM | POA: Diagnosis not present

## 2021-10-20 DIAGNOSIS — Z85820 Personal history of malignant melanoma of skin: Secondary | ICD-10-CM | POA: Diagnosis not present

## 2021-10-21 DIAGNOSIS — Z23 Encounter for immunization: Secondary | ICD-10-CM | POA: Diagnosis not present

## 2021-10-22 DIAGNOSIS — Z1231 Encounter for screening mammogram for malignant neoplasm of breast: Secondary | ICD-10-CM | POA: Diagnosis not present

## 2021-10-24 DIAGNOSIS — Z23 Encounter for immunization: Secondary | ICD-10-CM | POA: Diagnosis not present

## 2021-10-26 DIAGNOSIS — M85851 Other specified disorders of bone density and structure, right thigh: Secondary | ICD-10-CM | POA: Diagnosis not present

## 2021-10-26 DIAGNOSIS — M85852 Other specified disorders of bone density and structure, left thigh: Secondary | ICD-10-CM | POA: Diagnosis not present

## 2021-12-30 DIAGNOSIS — H2513 Age-related nuclear cataract, bilateral: Secondary | ICD-10-CM | POA: Diagnosis not present

## 2021-12-30 DIAGNOSIS — H5213 Myopia, bilateral: Secondary | ICD-10-CM | POA: Diagnosis not present

## 2022-01-14 DIAGNOSIS — Z20822 Contact with and (suspected) exposure to covid-19: Secondary | ICD-10-CM | POA: Diagnosis not present

## 2022-03-16 DIAGNOSIS — L400 Psoriasis vulgaris: Secondary | ICD-10-CM | POA: Diagnosis not present

## 2022-03-16 DIAGNOSIS — L820 Inflamed seborrheic keratosis: Secondary | ICD-10-CM | POA: Diagnosis not present

## 2022-03-16 DIAGNOSIS — Z85828 Personal history of other malignant neoplasm of skin: Secondary | ICD-10-CM | POA: Diagnosis not present

## 2022-03-16 DIAGNOSIS — D225 Melanocytic nevi of trunk: Secondary | ICD-10-CM | POA: Diagnosis not present

## 2022-03-16 DIAGNOSIS — Z85820 Personal history of malignant melanoma of skin: Secondary | ICD-10-CM | POA: Diagnosis not present

## 2022-03-16 DIAGNOSIS — L821 Other seborrheic keratosis: Secondary | ICD-10-CM | POA: Diagnosis not present

## 2022-03-16 DIAGNOSIS — L814 Other melanin hyperpigmentation: Secondary | ICD-10-CM | POA: Diagnosis not present

## 2022-03-16 DIAGNOSIS — L910 Hypertrophic scar: Secondary | ICD-10-CM | POA: Diagnosis not present

## 2022-03-16 DIAGNOSIS — D692 Other nonthrombocytopenic purpura: Secondary | ICD-10-CM | POA: Diagnosis not present

## 2022-03-16 DIAGNOSIS — L570 Actinic keratosis: Secondary | ICD-10-CM | POA: Diagnosis not present

## 2022-03-16 DIAGNOSIS — C44729 Squamous cell carcinoma of skin of left lower limb, including hip: Secondary | ICD-10-CM | POA: Diagnosis not present

## 2022-03-19 DIAGNOSIS — H6123 Impacted cerumen, bilateral: Secondary | ICD-10-CM | POA: Diagnosis not present

## 2022-05-14 ENCOUNTER — Emergency Department (HOSPITAL_COMMUNITY): Payer: Medicare Other

## 2022-05-14 ENCOUNTER — Other Ambulatory Visit: Payer: Self-pay

## 2022-05-14 ENCOUNTER — Encounter (HOSPITAL_COMMUNITY): Payer: Self-pay | Admitting: Emergency Medicine

## 2022-05-14 ENCOUNTER — Emergency Department (HOSPITAL_COMMUNITY)
Admission: EM | Admit: 2022-05-14 | Discharge: 2022-05-14 | Disposition: A | Discharge status: Home / Self Care | Payer: Medicare Other | Attending: Emergency Medicine | Admitting: Emergency Medicine

## 2022-05-14 DIAGNOSIS — R079 Chest pain, unspecified: Secondary | ICD-10-CM | POA: Diagnosis not present

## 2022-05-14 DIAGNOSIS — R072 Precordial pain: Secondary | ICD-10-CM | POA: Diagnosis not present

## 2022-05-14 LAB — BASIC METABOLIC PANEL
Anion gap: 9 (ref 5–15)
BUN: 16 mg/dL (ref 8–23)
CO2: 25 mmol/L (ref 22–32)
Calcium: 9.8 mg/dL (ref 8.9–10.3)
Chloride: 105 mmol/L (ref 98–111)
Creatinine, Ser: 0.76 mg/dL (ref 0.44–1.00)
GFR, Estimated: >60 mL/min (ref >60)
Glucose, Bld: 123 mg/dL — ABNORMAL HIGH (ref 70–99)
Potassium: 4 mmol/L (ref 3.5–5.1)
Sodium: 139 mmol/L (ref 135–145)

## 2022-05-14 LAB — CBC
HCT: 38.5 % (ref 36.0–46.0)
Hemoglobin: 12.9 g/dL (ref 12.0–15.0)
MCH: 30.6 pg (ref 26.0–34.0)
MCHC: 33.5 g/dL (ref 30.0–36.0)
MCV: 91.4 fL (ref 80.0–100.0)
Platelets: 282 10*3/uL (ref 150–400)
RBC: 4.21 MIL/uL (ref 3.87–5.11)
RDW: 13.1 % (ref 11.5–15.5)
WBC: 5.6 10*3/uL (ref 4.0–10.5)
nRBC: 0 % (ref 0.0–0.2)

## 2022-05-14 LAB — TROPONIN I (HIGH SENSITIVITY)
Troponin I (High Sensitivity): 4 ng/L (ref <18)
Troponin I (High Sensitivity): 5 ng/L (ref <18)

## 2022-05-14 LAB — PROTIME-INR
INR: 1 (ref 0.8–1.2)
Prothrombin Time: 12.7 seconds (ref 11.4–15.2)

## 2022-05-14 MED ORDER — ALUM & MAG HYDROXIDE-SIMETH 200-200-20 MG/5ML PO SUSP: 30.0000 mL | Freq: Once | ORAL | Status: DC

## 2022-05-14 MED ORDER — ALUM & MAG HYDROXIDE-SIMETH 200-200-20 MG/5ML PO SUSP
30.0000 mL | Freq: Once | ORAL | Status: AC
  Administered 2022-05-14: 30 mL via ORAL
  Filled 2022-05-14: qty 30

## 2022-05-14 MED ORDER — LIDOCAINE VISCOUS HCL 2 % MT SOLN: 15.0000 mL | Freq: Once | OROMUCOSAL | Status: DC

## 2022-05-14 MED ORDER — FAMOTIDINE IN NACL 20-0.9 MG/50ML-% IV SOLN
20.0000 mg | Freq: Once | INTRAVENOUS | Status: AC
  Administered 2022-05-14: 20 mg via INTRAVENOUS
  Filled 2022-05-14: qty 50

## 2022-05-14 MED ORDER — OMEPRAZOLE 20 MG PO CPDR: 20.0000 mg | DELAYED_RELEASE_CAPSULE | Freq: Every day | ORAL | 0 refills | Status: DC

## 2022-05-14 NOTE — ED Notes (Signed)
Patient verbalizes understanding of discharge instructions. Opportunity for questioning and answers were provided. Armband removed by staff, pt discharged from ED pt ambulatory to lobby

## 2022-05-14 NOTE — ED Triage Notes (Signed)
Patient reports left upper chest pain this evening with mild SOB , no emesis or diaphoresis , her cardiologist is Dr. Algernon Huxley .

## 2022-05-14 NOTE — ED Provider Notes (Signed)
[facility] EMERGENCY DEPARTMENT Provider Note   CSN: [id number] Arrival date & time: 05/14/22  0057     History  Chief Complaint  Patient presents with   Chest Pain    Chelsea Barnett is a 76 y.o. female.  The history is provided by the patient.  Chest Pain Pain location:  Substernal area Pain quality: dull   Pain severity:  Moderate Onset quality:  Sudden Duration:  2 hours Timing:  Constant Progression:  Unchanged Chronicity:  New Context: eating and at rest   Context comment:  Ate pizza just prior to going to sleep Relieved by:  Nothing Worsened by:  Nothing Ineffective treatments:  None tried Associated symptoms: no abdominal pain, no altered mental status, no back pain, no cough, no diaphoresis, no fever, no lower extremity edema, no nausea, no orthopnea, no palpitations, no shortness of breath, no syncope, no vomiting and no weakness   Associated symptoms comment:  No travel, no leg pain no exertional symptoms  Risk factors: no coronary artery disease, no hypertension, not female and not obese   Ate pizza and went to be and awakened with CP.  No SOB.  No n/v/d.  No exertional symptoms.  No leg pain no trauma.      Home Medications Prior to Admission medications   Medication Sig Start Date End Date Taking? Authorizing Provider  acetaminophen (TYLENOL) 325 MG tablet Take by mouth. 07/28/20   [provider]  Apremilast (OTEZLA) 30 MG TABS Take 60 mg by mouth daily.     [provider]  clobetasol cream (TEMOVATE) 0.05 %  12/10/19   [provider]  estradiol (ESTRACE) 0.1 MG/GM vaginal cream Insert pea-sized amount of cream per vagina every other night 01/16/20   [provider]  Fluocinolone Acetonide Body 0.01 % OIL fluocinolone 0.01 % scalp oil and shower cap    [provider]  folic acid (FOLVITE) 1 MG tablet Take 1 mg by mouth daily. 07/16/20   [provider]  losartan (COZAAR) 25 MG  tablet Take 1 tablet (25 mg total) by mouth daily. 03/24/16   Darlin Coco, MD  Naproxen Sodium (ALEVE PO) Take 220 mg by mouth as needed (pain).     [provider]  triamcinolone cream (KENALOG) 0.1 % Apply topically 2 (two) times daily as needed. 04/10/20   [provider]  Vitamin D, Ergocalciferol, (DRISDOL) 50000 units CAPS capsule Take 50,000 Units by mouth every 7 (seven) days.    [provider]      Allergies    Patient has no known allergies.    Review of Systems   Review of Systems  Constitutional:  Negative for diaphoresis and fever.  HENT:  Negative for facial swelling.   Eyes:  Negative for redness.  Respiratory:  Negative for cough and shortness of breath.   Cardiovascular:  Positive for chest pain. Negative for palpitations, orthopnea, leg swelling and syncope.  Gastrointestinal:  Negative for abdominal pain, nausea and vomiting.  Musculoskeletal:  Negative for back pain.  Neurological:  Negative for facial asymmetry and weakness.  All other systems reviewed and are negative.  Physical Exam Updated Vital Signs BP 127/60   Pulse 62   Temp 98.4 F (36.9 C) (Oral)   Resp 16   LMP 12/28/1999   SpO2 97%  Physical Exam Vitals and nursing note reviewed.  Constitutional:      General: She is not in acute distress.    Appearance: Normal appearance. She  is not diaphoretic.  HENT:     Head: Normocephalic and atraumatic.     Nose: Nose normal.  Eyes:     Conjunctiva/sclera: Conjunctivae normal.     Pupils: Pupils are equal, round, and reactive to light.  Cardiovascular:     Rate and Rhythm: Normal rate and regular rhythm.     Pulses: Normal pulses.     Heart sounds: Normal heart sounds.  Pulmonary:     Effort: Pulmonary effort is normal.     Breath sounds: Normal breath sounds.  Abdominal:     General: Abdomen is flat.     Palpations: Abdomen is soft.     Tenderness: There is no abdominal tenderness. There is no guarding or  rebound.     Comments: Gassy in the upper abdomen   Musculoskeletal:        General: No tenderness. Normal range of motion.     Cervical back: Normal range of motion and neck supple.     Right lower leg: No edema.     Left lower leg: No edema.  Skin:    General: Skin is warm and dry.     Capillary Refill: Capillary refill takes less than 2 seconds.  Neurological:     General: No focal deficit present.     Mental Status: She is alert and oriented to person, place, and time.     Deep Tendon Reflexes: Reflexes normal.  Psychiatric:        Mood and Affect: Mood normal.        Behavior: Behavior normal.    ED Results / Procedures / Treatments   Labs (all labs ordered are listed, but only abnormal results are displayed) Results for orders placed or performed during the hospital encounter of 79/89/21  Basic metabolic panel  Result Value Ref Range   Sodium 139 135 - 145 mmol/L   Potassium 4.0 3.5 - 5.1 mmol/L   Chloride 105 98 - 111 mmol/L   CO2 25 22 - 32 mmol/L   Glucose, Bld 123 (H) 70 - 99 mg/dL   BUN 16 8 - 23 mg/dL   Creatinine, Ser 0.76 0.44 - 1.00 mg/dL   Calcium 9.8 8.9 - 10.3 mg/dL   GFR, Estimated >60 >60 mL/min   Anion gap 9 5 - 15  CBC  Result Value Ref Range   WBC 5.6 4.0 - 10.5 K/uL   RBC 4.21 3.87 - 5.11 MIL/uL   Hemoglobin 12.9 12.0 - 15.0 g/dL   HCT 38.5 36.0 - 46.0 %   MCV 91.4 80.0 - 100.0 fL   MCH 30.6 26.0 - 34.0 pg   MCHC 33.5 30.0 - 36.0 g/dL   RDW 13.1 11.5 - 15.5 %   Platelets 282 150 - 400 K/uL   nRBC 0.0 0.0 - 0.2 %  Protime-INR (order if Patient is taking Coumadin / Warfarin)  Result Value Ref Range   Prothrombin Time 12.7 11.4 - 15.2 seconds   INR 1.0 0.8 - 1.2  Troponin I (High Sensitivity)  Result Value Ref Range   Troponin I (High Sensitivity) 4 <18 ng/L  Troponin I (High Sensitivity)  Result Value Ref Range   Troponin I (High Sensitivity) 5 <18 ng/L   DG Chest 2 View  Result Date: 05/14/2022 CLINICAL DATA:  Chest pain EXAM: CHEST -  2 VIEW COMPARISON:  11/20/2013 FINDINGS: The heart size and mediastinal contours are within normal limits. Both lungs are clear. The visualized skeletal structures are unremarkable. IMPRESSION: No active cardiopulmonary  disease. Electronically Signed   By: Inez Catalina M.D.   On: 05/14/2022 01:24      EKG EKG Interpretation  Date/Time:  Friday May 14 2022 01:03:29 EDT Ventricular Rate:  90 PR Interval:  174 QRS Duration: 72 QT Interval:  332 QTC Calculation: 406 R Axis:   52 Text Interpretation: Sinus rhythm with Premature supraventricular complexes Confirmed by Dory Horn) on 05/14/2022 1:25:27 AM  Radiology DG Chest 2 View  Result Date: 05/14/2022 CLINICAL DATA:  Chest pain EXAM: CHEST - 2 VIEW COMPARISON:  11/20/2013 FINDINGS: The heart size and mediastinal contours are within normal limits. Both lungs are clear. The visualized skeletal structures are unremarkable. IMPRESSION: No active cardiopulmonary disease. Electronically Signed   By: Inez Catalina M.D.   On: 05/14/2022 01:24    Procedures Procedures    Medications Ordered in ED Medications  alum & mag hydroxide-simeth (MAALOX/MYLANTA) 200-200-20 MG/5ML suspension 30 mL (30 mLs Oral Given 05/14/22 0242)  famotidine (PEPCID) IVPB 20 mg premix (0 mg Intravenous Stopped 05/14/22 0330)    ED Course/ Medical Decision Making/ A&P                           Medical Decision Making CP at night post eating pizza   Amount and/or Complexity of Data Reviewed Independent Historian: spouse    Details: see above External Data Reviewed: notes.    Details: outside notes reviewed Labs: ordered.    Details: all labs reviewed: 2 negative troponins 4/5.  Normal CBC white count 5.6 and normal hemoglobin 12.9 and normal platelets 282,000.  Normal chemistry panel sodium 139 and potassium 4.0 Radiology: ordered and independent interpretation performed.    Details: normal CXR by me ECG/medicine tests: ordered and independent  interpretation performed. Decision-making details documented in ED Course.  Risk OTC drugs. Prescription drug management. Risk Details: Ruled out for MI with 2 negative troponins and negative EKG.  Heart score is 2 low risk for MACE>  I do not believe this is a PE.  This is clearly GERD.  Will start gerd friendly diet and PPI.  Follow up with PMD.      Final Clinical Impression(s) / ED Diagnoses Final diagnoses:  None     Return for intractable cough, coughing up blood, fevers > 100.4 unrelieved by medication, shortness of breath, intractable vomiting, chest pain, shortness of breath, weakness, numbness, changes in speech, facial asymmetry, abdominal pain, passing out, Inability to tolerate liquids or food, cough, altered mental status or any concerns. No signs of systemic illness or infection. The patient is nontoxic-appearing on exam and vital signs are within normal limits.  I have reviewed the triage vital signs and the nursing notes. Pertinent labs & imaging results that were available during my care of the patient were reviewed by me and considered in my medical decision making (see chart for details). After history, exam, and medical workup I feel the patient has been appropriately medically screened and is safe for discharge home. Pertinent diagnoses were discussed with the patient. Patient was given return precautions.  Rx / DC Orders ED Discharge Orders     None         Mahsa Hanser, MD 05/14/22 0425

## 2022-07-22 DIAGNOSIS — H838X3 Other specified diseases of inner ear, bilateral: Secondary | ICD-10-CM | POA: Diagnosis not present

## 2022-07-22 DIAGNOSIS — H6122 Impacted cerumen, left ear: Secondary | ICD-10-CM | POA: Diagnosis not present

## 2022-07-22 DIAGNOSIS — H903 Sensorineural hearing loss, bilateral: Secondary | ICD-10-CM | POA: Diagnosis not present

## 2022-08-18 DIAGNOSIS — M79642 Pain in left hand: Secondary | ICD-10-CM | POA: Diagnosis not present

## 2022-08-19 DIAGNOSIS — R7989 Other specified abnormal findings of blood chemistry: Secondary | ICD-10-CM | POA: Diagnosis not present

## 2022-08-19 DIAGNOSIS — I10 Essential (primary) hypertension: Secondary | ICD-10-CM | POA: Diagnosis not present

## 2022-08-19 DIAGNOSIS — Z0000 Encounter for general adult medical examination without abnormal findings: Secondary | ICD-10-CM | POA: Diagnosis not present

## 2022-08-19 DIAGNOSIS — E559 Vitamin D deficiency, unspecified: Secondary | ICD-10-CM | POA: Diagnosis not present

## 2022-08-26 DIAGNOSIS — I10 Essential (primary) hypertension: Secondary | ICD-10-CM | POA: Diagnosis not present

## 2022-08-26 DIAGNOSIS — R82998 Other abnormal findings in urine: Secondary | ICD-10-CM | POA: Diagnosis not present

## 2022-09-02 DIAGNOSIS — Z1331 Encounter for screening for depression: Secondary | ICD-10-CM | POA: Diagnosis not present

## 2022-09-02 DIAGNOSIS — F418 Other specified anxiety disorders: Secondary | ICD-10-CM | POA: Diagnosis not present

## 2022-09-02 DIAGNOSIS — R4789 Other speech disturbances: Secondary | ICD-10-CM | POA: Diagnosis not present

## 2022-09-02 DIAGNOSIS — I10 Essential (primary) hypertension: Secondary | ICD-10-CM | POA: Diagnosis not present

## 2022-09-02 DIAGNOSIS — Z0000 Encounter for general adult medical examination without abnormal findings: Secondary | ICD-10-CM | POA: Diagnosis not present

## 2022-09-02 DIAGNOSIS — Z1339 Encounter for screening examination for other mental health and behavioral disorders: Secondary | ICD-10-CM | POA: Diagnosis not present

## 2022-09-02 DIAGNOSIS — M8580 Other specified disorders of bone density and structure, unspecified site: Secondary | ICD-10-CM | POA: Diagnosis not present

## 2022-09-02 DIAGNOSIS — L409 Psoriasis, unspecified: Secondary | ICD-10-CM | POA: Diagnosis not present

## 2022-09-02 DIAGNOSIS — E559 Vitamin D deficiency, unspecified: Secondary | ICD-10-CM | POA: Diagnosis not present

## 2022-09-02 DIAGNOSIS — G3184 Mild cognitive impairment, so stated: Secondary | ICD-10-CM | POA: Diagnosis not present

## 2022-09-02 DIAGNOSIS — D692 Other nonthrombocytopenic purpura: Secondary | ICD-10-CM | POA: Diagnosis not present

## 2022-09-02 DIAGNOSIS — D849 Immunodeficiency, unspecified: Secondary | ICD-10-CM | POA: Diagnosis not present

## 2022-09-03 DIAGNOSIS — M79645 Pain in left finger(s): Secondary | ICD-10-CM | POA: Diagnosis not present

## 2022-09-17 DIAGNOSIS — M79645 Pain in left finger(s): Secondary | ICD-10-CM | POA: Diagnosis not present

## 2022-10-02 DIAGNOSIS — Z23 Encounter for immunization: Secondary | ICD-10-CM | POA: Diagnosis not present

## 2022-10-04 DIAGNOSIS — Z23 Encounter for immunization: Secondary | ICD-10-CM | POA: Diagnosis not present

## 2022-10-26 DIAGNOSIS — Z1231 Encounter for screening mammogram for malignant neoplasm of breast: Secondary | ICD-10-CM | POA: Diagnosis not present

## 2023-01-05 DIAGNOSIS — H52203 Unspecified astigmatism, bilateral: Secondary | ICD-10-CM | POA: Diagnosis not present

## 2023-01-05 DIAGNOSIS — H2513 Age-related nuclear cataract, bilateral: Secondary | ICD-10-CM | POA: Diagnosis not present

## 2023-03-14 DIAGNOSIS — M13842 Other specified arthritis, left hand: Secondary | ICD-10-CM | POA: Diagnosis not present

## 2023-03-30 DIAGNOSIS — L400 Psoriasis vulgaris: Secondary | ICD-10-CM | POA: Diagnosis not present

## 2023-03-30 DIAGNOSIS — L821 Other seborrheic keratosis: Secondary | ICD-10-CM | POA: Diagnosis not present

## 2023-03-30 DIAGNOSIS — L570 Actinic keratosis: Secondary | ICD-10-CM | POA: Diagnosis not present

## 2023-03-30 DIAGNOSIS — Z85828 Personal history of other malignant neoplasm of skin: Secondary | ICD-10-CM | POA: Diagnosis not present

## 2023-03-30 DIAGNOSIS — D692 Other nonthrombocytopenic purpura: Secondary | ICD-10-CM | POA: Diagnosis not present

## 2023-03-30 DIAGNOSIS — L308 Other specified dermatitis: Secondary | ICD-10-CM | POA: Diagnosis not present

## 2023-03-30 DIAGNOSIS — L218 Other seborrheic dermatitis: Secondary | ICD-10-CM | POA: Diagnosis not present

## 2023-03-30 DIAGNOSIS — D485 Neoplasm of uncertain behavior of skin: Secondary | ICD-10-CM | POA: Diagnosis not present

## 2023-03-30 DIAGNOSIS — L309 Dermatitis, unspecified: Secondary | ICD-10-CM | POA: Diagnosis not present

## 2023-03-30 DIAGNOSIS — Z85820 Personal history of malignant melanoma of skin: Secondary | ICD-10-CM | POA: Diagnosis not present

## 2023-05-11 DIAGNOSIS — L905 Scar conditions and fibrosis of skin: Secondary | ICD-10-CM | POA: Diagnosis not present

## 2023-05-11 DIAGNOSIS — Z85820 Personal history of malignant melanoma of skin: Secondary | ICD-10-CM | POA: Diagnosis not present

## 2023-05-11 DIAGNOSIS — L400 Psoriasis vulgaris: Secondary | ICD-10-CM | POA: Diagnosis not present

## 2023-05-11 DIAGNOSIS — D1801 Hemangioma of skin and subcutaneous tissue: Secondary | ICD-10-CM | POA: Diagnosis not present

## 2023-05-11 DIAGNOSIS — L570 Actinic keratosis: Secondary | ICD-10-CM | POA: Diagnosis not present

## 2023-05-11 DIAGNOSIS — Z85828 Personal history of other malignant neoplasm of skin: Secondary | ICD-10-CM | POA: Diagnosis not present

## 2023-05-11 DIAGNOSIS — D692 Other nonthrombocytopenic purpura: Secondary | ICD-10-CM | POA: Diagnosis not present

## 2023-05-11 DIAGNOSIS — L821 Other seborrheic keratosis: Secondary | ICD-10-CM | POA: Diagnosis not present

## 2023-06-28 ENCOUNTER — Emergency Department (HOSPITAL_COMMUNITY): Payer: Medicare Other

## 2023-06-28 ENCOUNTER — Observation Stay (HOSPITAL_COMMUNITY): Payer: Medicare Other

## 2023-06-28 ENCOUNTER — Encounter (HOSPITAL_COMMUNITY): Payer: Self-pay

## 2023-06-28 ENCOUNTER — Observation Stay (HOSPITAL_BASED_OUTPATIENT_CLINIC_OR_DEPARTMENT_OTHER): Payer: Medicare Other

## 2023-06-28 ENCOUNTER — Other Ambulatory Visit: Payer: Self-pay

## 2023-06-28 ENCOUNTER — Observation Stay (HOSPITAL_COMMUNITY)
Admission: EM | Admit: 2023-06-28 | Discharge: 2023-06-29 | Disposition: A | Discharge status: Home / Self Care | Payer: Medicare Other | Attending: Internal Medicine | Admitting: Internal Medicine

## 2023-06-28 DIAGNOSIS — I63512 Cerebral infarction due to unspecified occlusion or stenosis of left middle cerebral artery: Secondary | ICD-10-CM | POA: Diagnosis not present

## 2023-06-28 DIAGNOSIS — R29898 Other symptoms and signs involving the musculoskeletal system: Secondary | ICD-10-CM | POA: Diagnosis not present

## 2023-06-28 DIAGNOSIS — Z79899 Other long term (current) drug therapy: Secondary | ICD-10-CM | POA: Diagnosis not present

## 2023-06-28 DIAGNOSIS — R531 Weakness: Secondary | ICD-10-CM | POA: Diagnosis not present

## 2023-06-28 DIAGNOSIS — E7800 Pure hypercholesterolemia, unspecified: Secondary | ICD-10-CM | POA: Diagnosis not present

## 2023-06-28 DIAGNOSIS — Z85828 Personal history of other malignant neoplasm of skin: Secondary | ICD-10-CM | POA: Insufficient documentation

## 2023-06-28 DIAGNOSIS — I6389 Other cerebral infarction: Secondary | ICD-10-CM

## 2023-06-28 DIAGNOSIS — I10 Essential (primary) hypertension: Secondary | ICD-10-CM | POA: Insufficient documentation

## 2023-06-28 DIAGNOSIS — I609 Nontraumatic subarachnoid hemorrhage, unspecified: Secondary | ICD-10-CM

## 2023-06-28 DIAGNOSIS — I639 Cerebral infarction, unspecified: Secondary | ICD-10-CM | POA: Diagnosis present

## 2023-06-28 DIAGNOSIS — I700 Atherosclerosis of aorta: Secondary | ICD-10-CM | POA: Diagnosis not present

## 2023-06-28 DIAGNOSIS — R200 Anesthesia of skin: Secondary | ICD-10-CM | POA: Diagnosis not present

## 2023-06-28 LAB — I-STAT CHEM 8, ED
BUN: 17 mg/dL (ref 8–23)
Calcium, Ion: 1.1 mmol/L — ABNORMAL LOW (ref 1.15–1.40)
Chloride: 106 mmol/L (ref 98–111)
Creatinine, Ser: 0.8 mg/dL (ref 0.44–1.00)
Glucose, Bld: 108 mg/dL — ABNORMAL HIGH (ref 70–99)
HCT: 40 % (ref 36.0–46.0)
Hemoglobin: 13.6 g/dL (ref 12.0–15.0)
Potassium: 3.7 mmol/L (ref 3.5–5.1)
Sodium: 140 mmol/L (ref 135–145)
TCO2: 23 mmol/L (ref 22–32)

## 2023-06-28 LAB — ECHOCARDIOGRAM COMPLETE
AV Mean grad: 4 mmHg
AV Peak grad: 7.2 mmHg
Ao pk vel: 1.34 m/s
Area-P 1/2: 2.82 cm2
Height: 63.5 in
S' Lateral: 3 cm
Weight: 2016 oz

## 2023-06-28 LAB — COMPREHENSIVE METABOLIC PANEL
ALT: 14 U/L (ref 0–44)
AST: 19 U/L (ref 15–41)
Albumin: 4.1 g/dL (ref 3.5–5.0)
Alkaline Phosphatase: 55 U/L (ref 38–126)
Anion gap: 14 (ref 5–15)
BUN: 16 mg/dL (ref 8–23)
CO2: 22 mmol/L (ref 22–32)
Calcium: 9.3 mg/dL (ref 8.9–10.3)
Chloride: 103 mmol/L (ref 98–111)
Creatinine, Ser: 0.77 mg/dL (ref 0.44–1.00)
GFR, Estimated: >60 mL/min (ref >60)
Glucose, Bld: 109 mg/dL — ABNORMAL HIGH (ref 70–99)
Potassium: 3.7 mmol/L (ref 3.5–5.1)
Sodium: 139 mmol/L (ref 135–145)
Total Bilirubin: 0.7 mg/dL (ref 0.3–1.2)
Total Protein: 6.7 g/dL (ref 6.5–8.1)

## 2023-06-28 LAB — CBC
HCT: 40.8 % (ref 36.0–46.0)
Hemoglobin: 13.4 g/dL (ref 12.0–15.0)
MCH: 30.2 pg (ref 26.0–34.0)
MCHC: 32.8 g/dL (ref 30.0–36.0)
MCV: 92.1 fL (ref 80.0–100.0)
Platelets: 259 10*3/uL (ref 150–400)
RBC: 4.43 MIL/uL (ref 3.87–5.11)
RDW: 12.6 % (ref 11.5–15.5)
WBC: 4.5 10*3/uL (ref 4.0–10.5)
nRBC: 0 % (ref 0.0–0.2)

## 2023-06-28 LAB — DIFFERENTIAL
Abs Immature Granulocytes: 0.01 10*3/uL (ref 0.00–0.07)
Basophils Absolute: 0.1 10*3/uL (ref 0.0–0.1)
Basophils Relative: 1 %
Eosinophils Absolute: 0 10*3/uL (ref 0.0–0.5)
Eosinophils Relative: 1 %
Immature Granulocytes: 0 %
Lymphocytes Relative: 29 %
Lymphs Abs: 1.3 10*3/uL (ref 0.7–4.0)
Monocytes Absolute: 0.4 10*3/uL (ref 0.1–1.0)
Monocytes Relative: 8 %
Neutro Abs: 2.7 10*3/uL (ref 1.7–7.7)
Neutrophils Relative %: 61 %

## 2023-06-28 LAB — HEMOGLOBIN A1C
Hgb A1c MFr Bld: 5.4 % (ref 4.8–5.6)
Mean Plasma Glucose: 108.28 mg/dL

## 2023-06-28 LAB — LIPID PANEL
Cholesterol: 249 mg/dL — ABNORMAL HIGH (ref 0–200)
HDL: 101 mg/dL (ref >40)
LDL Cholesterol: 130 mg/dL — ABNORMAL HIGH (ref 0–99)
Total CHOL/HDL Ratio: 2.5 RATIO
Triglycerides: 90 mg/dL (ref <150)
VLDL: 18 mg/dL (ref 0–40)

## 2023-06-28 LAB — ETHANOL: Alcohol, Ethyl (B): <10 mg/dL (ref <10)

## 2023-06-28 LAB — APTT: aPTT: 30 seconds (ref 24–36)

## 2023-06-28 LAB — PROTIME-INR
INR: 1 (ref 0.8–1.2)
Prothrombin Time: 13.2 seconds (ref 11.4–15.2)

## 2023-06-28 MED ORDER — SODIUM CHLORIDE 0.9 % IV SOLN: INTRAVENOUS | Status: DC

## 2023-06-28 MED ORDER — IOHEXOL 350 MG/ML SOLN
75.0000 mL | Freq: Once | INTRAVENOUS | Status: AC | PRN
  Administered 2023-06-28: 75 mL via INTRAVENOUS

## 2023-06-28 MED ORDER — ENOXAPARIN SODIUM 40 MG/0.4ML IJ SOSY
40.0000 mg | PREFILLED_SYRINGE | INTRAMUSCULAR | Status: DC
  Administered 2023-06-28: 40 mg via SUBCUTANEOUS
  Filled 2023-06-28: qty 0.4

## 2023-06-28 MED ORDER — ACETAMINOPHEN 325 MG PO TABS: 650.0000 mg | ORAL_TABLET | ORAL | Status: DC | PRN

## 2023-06-28 MED ORDER — SENNOSIDES-DOCUSATE SODIUM 8.6-50 MG PO TABS: 1.0000 | ORAL_TABLET | Freq: Every evening | ORAL | Status: DC | PRN

## 2023-06-28 MED ORDER — LOSARTAN POTASSIUM 50 MG PO TABS
25.0000 mg | ORAL_TABLET | Freq: Every day | ORAL | Status: DC
  Administered 2023-06-28 – 2023-06-29 (×2): 25 mg via ORAL
  Filled 2023-06-28 (×2): qty 1

## 2023-06-28 MED ORDER — HYDRALAZINE HCL 20 MG/ML IJ SOLN
10.0000 mg | INTRAMUSCULAR | Status: DC | PRN
  Administered 2023-06-28: 10 mg via INTRAVENOUS
  Filled 2023-06-28: qty 1

## 2023-06-28 MED ORDER — ACETAMINOPHEN 160 MG/5ML PO SOLN: 650.0000 mg | ORAL | Status: DC | PRN

## 2023-06-28 MED ORDER — ACETAMINOPHEN 650 MG RE SUPP: 650.0000 mg | RECTAL | Status: DC | PRN

## 2023-06-28 MED ORDER — STROKE: EARLY STAGES OF RECOVERY BOOK
Freq: Once | Status: AC
  Filled 2023-06-28: qty 1

## 2023-06-28 NOTE — Consult Note (Signed)
Neurology Consultation  Reason for Consult: Stroke on MRI Referring Physician: Jeraldine Loots  CC:  History is obtained from: patient and past medical records  HPI: Chelsea Barnett is a 77 y.o. female with a past medical history of migraines with aura, psoriasis, and history of skin cancer who presents with complaints of right arm heaviness. She was working out in the yard yesterday when she had an episode of right arm heaviness starting around 1730. The symptoms resolved after approximately 1-2 minutes. She had a recurrence of symptoms later in the evening that last a couple of minutes as well.   MRI brain showed small volume acute SAH along L frontal and parietal lobes and 3mm acute cortical infarct in L MCA terrtiroy (personal review).   LKW: 1730 7/1 TNK given? No outside of window  Premorbid modified Rankin scale (mRS): 0 0-Completely asymptomatic and back to baseline post-stroke   ROS: Full ROS was performed and is negative except as noted in the HPI.   Past Medical History:  Diagnosis Date   Basal cell cancer    right leg   Heart murmur    Hemorrhoids    Melanoma (HCC) 2005   on left arm   Migraines    with aura   Post-operative nausea and vomiting    after 2 surgeries   Psoriasis    Vaginal delivery    times 6    Family History  Problem Relation Age of Onset   Heart failure Father    Heart disease Father        CVD   Melanoma Mother    Hypertension Mother    Heart disease Mother        CVD   Depression Sister    Melanoma Sister    Kidney failure Sister    Heart disease Brother        stint   Atrial fibrillation Sister     Social History:   reports that she has never smoked. She has never used smokeless tobacco. She reports that she does not currently use alcohol. She reports that she does not use drugs.  Medications  Current Facility-Administered Medications:    [START ON 06/29/2023]  stroke: early stages of recovery book, , Does not apply, Once,  Smith, Rondell A, MD   0.9 %  sodium chloride infusion, , Intravenous, Continuous, Katrinka Blazing, Rondell A, MD, Last Rate: 75 mL/hr at 06/28/23 1245, New Bag at 06/28/23 1245   acetaminophen (TYLENOL) tablet 650 mg, 650 mg, Oral, Q4H PRN **OR** acetaminophen (TYLENOL) 160 MG/5ML solution 650 mg, 650 mg, Per Tube, Q4H PRN **OR** acetaminophen (TYLENOL) suppository 650 mg, 650 mg, Rectal, Q4H PRN, Smith, Rondell A, MD   hydrALAZINE (APRESOLINE) injection 10 mg, 10 mg, Intravenous, Q4H PRN, Katrinka Blazing, Rondell A, MD, 10 mg at 06/28/23 1525   senna-docusate (Senokot-S) tablet 1 tablet, 1 tablet, Oral, QHS PRN, Madelyn Flavors A, MD  Medications Prior to Admission  Medication Sig Dispense Refill   acetaminophen (TYLENOL) 325 MG tablet Take by mouth.     Apremilast (OTEZLA) 30 MG TABS Take 60 mg by mouth daily.      clobetasol cream (TEMOVATE) 0.05 %      estradiol (ESTRACE) 0.1 MG/GM vaginal cream Insert pea-sized amount of cream per vagina every other night     Fluocinolone Acetonide Body 0.01 % OIL fluocinolone 0.01 % scalp oil and shower cap     folic acid (FOLVITE) 1 MG tablet Take 1 mg by mouth daily.  losartan (COZAAR) 25 MG tablet Take 1 tablet (25 mg total) by mouth daily. 90 tablet 2   Naproxen Sodium (ALEVE PO) Take 220 mg by mouth as needed (pain).      omeprazole (PRILOSEC) 20 MG capsule Take 1 capsule (20 mg total) by mouth daily. 30 capsule 0   triamcinolone cream (KENALOG) 0.1 % Apply topically 2 (two) times daily as needed.     Vitamin D, Ergocalciferol, (DRISDOL) 50000 units CAPS capsule Take 50,000 Units by mouth every 7 (seven) days.      Exam: Current vital signs: BP (!) 172/74   Pulse 60   Temp 98.2 F (36.8 C) (Oral)   Resp 14   Ht 5' 3.5" (1.613 m)   Wt 57.2 kg   LMP 12/28/1999   SpO2 98%   BMI 21.97 kg/m  Vital signs in last 24 hours: Temp:  [98.2 F (36.8 C)-98.4 F (36.9 C)] 98.2 F (36.8 C) (07/02 1300) Pulse Rate:  [56-81] 60 (07/02 1050) Resp:  [14-22] 14 (07/02  1050) BP: (133-197)/(69-117) 172/74 (07/02 1525) SpO2:  [97 %-99 %] 98 % (07/02 1050) Weight:  [57.2 kg] 57.2 kg (07/02 0802)   Physical Exam Gen: A&Ox4, NAD HEENT: Atraumatic, normocephalic; oropharynx clear, tongue without atrophy or fasciculations. Resp: CTAB, normal work of breathing CV: RRR, extremities appear well-perfused. Abd: soft/NT/ND Extrem: Nml bulk; no cyanosis, clubbing, or edema.  Neuro: *MS: A&O x4. Follows multi-step commands.  *Speech: no dysarthria or aphasia, able to name and repeat. *CN:    I: Deferred   II,III: PERRLA, VFF by confrontation, optic discs not visualized 2/2 pupillary constriction   III,IV,VI: EOMI w/o nystagmus, no ptosis   V: Sensation intact from V1 to V3 to LT   VII: Eyelid closure was full.  Smile symmetric.   VIII: Hearing intact to voice   IX,X: Voice normal, palate elevates symmetrically    XI: SCM/trap 5/5 bilat   XII: Tongue protrudes midline, no atrophy or fasciculations  *Motor:   Normal bulk.  No tremor, rigidity or bradykinesia. No pronator drift.   Strength: Dlt Bic Tri WE WrF FgS Gr HF KnF KnE PlF DoF    Left 5 5 5 5 5 5 5 5 5 5 5 5     Right 5 5 5 5 5 5 5 5 5 5 5 5    *Sensory: Intact to light touch, pinprick, temperature vibration throughout. Symmetric. Propioception intact bilat.  No double-simultaneous extinction.  *Coordination:  Finger-to-nose, heel-to-shin, rapid alternating motions were intact. *Reflexes:  2+ and symmetric throughout without clonus; toes down-going bilat *Gait: deferred  NIHSS = 0   Imaging  I have reviewed the images obtained:  CT head - no acute intracranial process  CT Angio Head and Neck - no hemodynamically significant stenoses  MRI examination of the brain - revealed small volume acute subarachnoid hemorrhage along the left frontal and parietal lobes, 3 mm acute cortical infarct within the mid left frontal lobe, and 4 mm acute cortical infarct question within the posterior left frontal  lobe   TTE WNL  Assessment:  77 y.o. female presenting with 2 episodes of R arm weakness now resolved and found to have v small acute cortical infarct L frontal lobe and small volume SAH along L frontal and parietal lobes  - Inpatient stroke workup completed - SBP goal <150 given SAH - Check LDL and add statin per guidelines - SLP/OT/PT - Dr. Pearlean Brownie of stroke service will round on patient tomorrow to make recommendations on antiplatelets -  Ambulatory cardiac monitoring upon discharge given cortical infarct  Bing Neighbors, MD [facility] [phone]  If 7pm- 7am, please page neurology on call as listed in [facility].

## 2023-06-28 NOTE — ED Notes (Signed)
ED TO INPATIENT HANDOFF REPORT  ED Nurse Name and Phone #: [phone]  S Name/Age/Gender Chelsea Barnett 77 y.o. female Room/Bed: 031C/031C  Code Status   Code Status: Not on file  Home/SNF/Other Home Patient oriented to: self, place, time, and situation Is this baseline? Yes   Triage Complete: Triage complete  Chief Complaint Right arm weakness [R29.898]  Triage Note Pt BIBPOV pt had right sided weakness, right sided drift, and numbness around her mouth. Pt is alert and oriented, stating symptoms resolved after a "few minutes"   Allergies No Known Allergies  Level of Care/Admitting Diagnosis ED Disposition     ED Disposition  Admit   Condition  --   Comment  Hospital Area: [facility] [[id number]]  Level of Care: Telemetry Medical [104]  May place patient in observation at [facility] or [facility] if equivalent level of care is available:: No  Covid Evaluation: Asymptomatic - no recent exposure (last 10 days) testing not required  Diagnosis: Right arm weakness [454098]  Admitting Physician: Clydie Braun [[id number]]  Attending Physician: Clydie Braun [[id number]]          B Medical/Surgery History Past Medical History:  Diagnosis Date   Basal cell cancer    right leg   Heart murmur    Hemorrhoids    Melanoma (HCC) 2005   on left arm   Migraines    with aura   Post-operative nausea and vomiting    after 2 surgeries   Psoriasis    Vaginal delivery    times 6   Past Surgical History:  Procedure Laterality Date   basal cell CA removal     BUNIONECTOMY Right    fallopian tube removal     laparoscopic bso- fibroma   melanoma removal     left upper arm   ovaries removed     laparoscopic bso- fibroma   REFRACTIVE SURGERY     Bil   TUBAL LIGATION  1984   WISDOM TOOTH EXTRACTION       A IV Location/Drains/Wounds Patient Lines/Drains/Airways Status     Active Line/Drains/Airways     Name Placement date Placement time  Site Days   Peripheral IV 06/28/23 20 G 1" Right Antecubital 06/28/23  0817  Antecubital  less than 1            Intake/Output Last 24 hours No intake or output data in the 24 hours ending 06/28/23 1056  Labs/Imaging Results for orders placed or performed during the hospital encounter of 06/28/23 (from the past 48 hour(s))  Protime-INR     Status: None   Collection Time: 06/28/23  8:16 AM  Result Value Ref Range   Prothrombin Time 13.2 11.4 - 15.2 seconds   INR 1.0 0.8 - 1.2    Comment: (NOTE) INR goal varies based on device and disease states. Performed at [facility], [address] [place], [place] [address]   APTT     Status: None   Collection Time: 06/28/23  8:16 AM  Result Value Ref Range   aPTT 30 24 - 36 seconds    Comment: Performed at [facility], [address] [place], [place] [address]  CBC     Status: None   Collection Time: 06/28/23  8:16 AM  Result Value Ref Range   WBC 4.5 4.0 - 10.5 K/uL   RBC 4.43 3.87 - 5.11 MIL/uL   Hemoglobin 13.4 12.0 - 15.0 g/dL   HCT  40.8 36.0 - 46.0 %   MCV 92.1 80.0 - 100.0 fL   MCH 30.2 26.0 - 34.0 pg   MCHC 32.8 30.0 - 36.0 g/dL   RDW 16.1 09.6 - 04.5 %   Platelets 259 150 - 400 K/uL   nRBC 0.0 0.0 - 0.2 %    Comment: Performed at [facility], [address], [place], [place] [address]  Differential     Status: None   Collection Time: 06/28/23  8:16 AM  Result Value Ref Range   Neutrophils Relative % 61 %   Neutro Abs 2.7 1.7 - 7.7 K/uL   Lymphocytes Relative 29 %   Lymphs Abs 1.3 0.7 - 4.0 K/uL   Monocytes Relative 8 %   Monocytes Absolute 0.4 0.1 - 1.0 K/uL   Eosinophils Relative 1 %   Eosinophils Absolute 0.0 0.0 - 0.5 K/uL   Basophils Relative 1 %   Basophils Absolute 0.1 0.0 - 0.1 K/uL   Immature Granulocytes 0 %   Abs Immature Granulocytes 0.01 0.00 - 0.07 K/uL    Comment: Performed at [facility], [address] [place], [place] [address]  Comprehensive metabolic panel      Status: Abnormal   Collection Time: 06/28/23  8:16 AM  Result Value Ref Range   Sodium 139 135 - 145 mmol/L   Potassium 3.7 3.5 - 5.1 mmol/L   Chloride 103 98 - 111 mmol/L   CO2 22 22 - 32 mmol/L   Glucose, Bld 109 (H) 70 - 99 mg/dL    Comment: Glucose reference range applies only to samples taken after fasting for at least 8 hours.   BUN 16 8 - 23 mg/dL   Creatinine, Ser 8.29 0.44 - 1.00 mg/dL   Calcium 9.3 8.9 - 56.2 mg/dL   Total Protein 6.7 6.5 - 8.1 g/dL   Albumin 4.1 3.5 - 5.0 g/dL   AST 19 15 - 41 U/L   ALT 14 0 - 44 U/L   Alkaline Phosphatase 55 38 - 126 U/L   Total Bilirubin 0.7 0.3 - 1.2 mg/dL   GFR, Estimated >13 >08 mL/min    Comment: (NOTE) Calculated using the CKD-EPI Creatinine Equation (2021)    Anion gap 14 5 - 15    Comment: Performed at [facility], [address], [place], [place] [address]  I-stat chem 8, ED     Status: Abnormal   Collection Time: 06/28/23  8:24 AM  Result Value Ref Range   Sodium 140 135 - 145 mmol/L   Potassium 3.7 3.5 - 5.1 mmol/L   Chloride 106 98 - 111 mmol/L   BUN 17 8 - 23 mg/dL   Creatinine, Ser 6.96 0.44 - 1.00 mg/dL   Glucose, Bld 295 (H) 70 - 99 mg/dL    Comment: Glucose reference range applies only to samples taken after fasting for at least 8 hours.   Calcium, Ion 1.10 (L) 1.15 - 1.40 mmol/L   TCO2 23 22 - 32 mmol/L   Hemoglobin 13.6 12.0 - 15.0 g/dL   HCT 28.4 13.2 - 44.0 %   CT HEAD WO CONTRAST  Result Date: 06/28/2023 CLINICAL DATA:  Right-sided weakness and right-sided drift, numbness around the mouth. Symptoms now resolved. EXAM: CT HEAD WITHOUT CONTRAST TECHNIQUE: Contiguous axial images were obtained from the base of the skull through the vertex without intravenous contrast. RADIATION DOSE REDUCTION: This exam was performed according to the departmental dose-optimization program which includes automated exposure control,  adjustment of the mA and/or kV according to patient size and/or use of iterative  reconstruction technique. COMPARISON:  Brain MRI 03/29/2021 FINDINGS: Brain: There is no acute intracranial hemorrhage, extra-axial fluid collection, or acute infarct. Parenchymal volume is normal for age. The ventricles are normal in size. Gray-white differentiation is preserved. Patchy hypodensity in the supratentorial white matter likely reflects sequela of underlying chronic small-vessel ischemic change. The pituitary and suprasellar region are normal. There is no mass lesion. There is no mass effect or midline shift. Vascular: No hyperdense vessel or unexpected calcification. Skull: Normal. Negative for fracture or focal lesion. Sinuses/Orbits: The imaged paranasal sinuses are clear. The globes and orbits are unremarkable. Other: The mastoid air cells and middle ear cavities are clear. IMPRESSION: No acute intracranial pathology. Electronically Signed   By: Lesia Hausen M.D.   On: 06/28/2023 08:56    Pending Labs Unresulted Labs (From admission, onward)     Start     Ordered   06/28/23 0757  Ethanol  Once,   URGENT        06/28/23 0757   06/28/23 0757  Urine rapid drug screen (hosp performed)  Once,   STAT        06/28/23 0757   06/28/23 0757  Urinalysis, Routine w reflex microscopic -Urine, Clean Catch  Once,   URGENT       Question:  Specimen Source  Answer:  Urine, Clean Catch   06/28/23 0757            Vitals/Pain Today's Vitals   06/28/23 0915 06/28/23 0930 06/28/23 0945 06/28/23 1050  BP: (!) 159/75 (!) 148/69 133/70 (!) 168/77  Pulse: 60 (!) 56 60 60  Resp: 15 (!) 22 20 14   Temp:      TempSrc:      SpO2: 98% 98% 97% 98%  Weight:      Height:      PainSc:        Isolation Precautions No active isolations  Medications Medications - No data to display  Mobility walks     Focused Assessments Cardiac Assessment Handoff:  Cardiac Rhythm: Normal sinus rhythm No results found for: "CKTOTAL", "CKMB", "CKMBINDEX", "TROPONINI" Lab Results  Component Value Date    DDIMER 0.28 11/19/2013   Does the Patient currently have chest pain? No   , Neuro Assessment Handoff:  Swallow screen pass? Yes  Cardiac Rhythm: Normal sinus rhythm NIH Stroke Scale  Dizziness Present: No Headache Present: No Interval: Initial Level of Consciousness (1a.)   : Alert, keenly responsive LOC Questions (1b. )   : Answers both questions correctly LOC Commands (1c. )   : Performs both tasks correctly Best Gaze (2. )  : Normal Visual (3. )  : No visual loss Facial Palsy (4. )    : Normal symmetrical movements Motor Arm, Left (5a. )   : No drift Motor Arm, Right (5b. ) : No drift Motor Leg, Left (6a. )  : No drift Motor Leg, Right (6b. ) : No drift Limb Ataxia (7. ): Absent Sensory (8. )  : Mild-to-moderate sensory loss, patient feels pinprick is less sharp or is dull on the affected side, or there is a loss of superficial pain with pinprick, but patient is aware of being touched Best Language (9. )  : No aphasia Dysarthria (10. ): Normal Extinction/Inattention (11.)   : No Abnormality Complete NIHSS TOTAL: 1     R Recommendations: See Admitting Provider Note  Report given to:  Additional Notes:

## 2023-06-28 NOTE — Evaluation (Signed)
Physical Therapy Evaluation Patient Details Name: Chelsea Barnett MRN: [id number] DOB: 1946/09/12 Today's Date: 06/28/2023  History of Present Illness  77 y.o. female presents to ED 06/28/23 with complaints of right arm heaviness, Reports similar episode yesterday that only lasted 1-2 minutes. MRI reveals acute subarachnoid hemorrhage along left frontal and parietal lobes as well as acute cortical infarcts in left frontal lobes. WUJ:[id number] with aura, psoriasis, and history of skin cancer  Clinical Impression  Patient evaluated by Physical Therapy with no further acute PT needs identified. PT provided BE FAST eduction. Pt and family with no further questions. Pt is at her baseline level of independent functioning and does not have any follow-up Physical Therapy or equipment needs. PT is signing off. Thank you for this referral.         Assistance Recommended at Discharge PRN  If plan is discharge home, recommend the following:  Can travel by private vehicle  Assist for transportation;Assistance with cooking/housework    Yes    Equipment Recommendations None recommended by PT     Functional Status Assessment Patient has not had a recent decline in their functional status     Precautions / Restrictions Precautions Precautions: None Restrictions Weight Bearing Restrictions: No      Mobility  Bed Mobility Overal bed mobility: Modified Independent             General bed mobility comments: HoB elevated use of bed rail to pull to EoB    Transfers Overall transfer level: Independent                      Ambulation/Gait Ambulation/Gait assistance: Independent Gait Distance (Feet): 500 Feet Assistive device: None Gait Pattern/deviations: WFL(Within Functional Limits), Step-through pattern Gait velocity: WFL Gait velocity interpretation: >2.62 ft/sec, indicative of community ambulatory   General Gait Details: walks with a quick, sure pace, ablet to  negotiate hallways easily      Balance Overall balance assessment: Independent                                           Pertinent Vitals/Pain Pain Assessment Pain Assessment: No/denies pain    Home Living Family/patient expects to be discharged to:: Private residence Living Arrangements: Spouse/significant other Available Help at Discharge: Available 24 hours/day Type of Home: House Home Access: Stairs to enter   [employer] of Steps: 2-3 Alternate Level Stairs-Number of Steps: 12 Home Layout: Multi-level;Able to live on main level with bedroom/bathroom Home Equipment: None;Hand held shower head;Shower seat      Prior Function Prior Level of Function : Independent/Modified Independent                        Extremity/Trunk Assessment   Upper Extremity Assessment Upper Extremity Assessment: Overall WFL for tasks assessed    Lower Extremity Assessment Lower Extremity Assessment: RLE deficits/detail;LLE deficits/detail RLE Deficits / Details: ROM full, strength 4+/5 RLE Sensation: WNL RLE Coordination: WNL LLE Deficits / Details: ROM full, strength 4+/5 LLE Sensation: WNL LLE Coordination: WNL    Cervical / Trunk Assessment Cervical / Trunk Assessment: Normal  Communication   Communication: No difficulties  Cognition Arousal/Alertness: Awake/alert Behavior During Therapy: WFL for tasks assessed/performed Overall Cognitive Status: Within Functional Limits for tasks assessed  General Comments General comments (skin integrity, edema, etc.): at rest BP 158/78, HR 68, SpO2 on RA 98%O2, after during ambulation HR 110bpm, SpO2 96%O2, with return to supine BP 172/74, HR 78, SpO2 98%O2, husband and 2 adult daughters present        Assessment/Plan    PT Assessment Patient does not need any further PT services         PT Goals (Current goals can be found in the Care  Plan section)  Acute Rehab PT Goals Patient Stated Goal: get back home PT Goal Formulation: All assessment and education complete, DC therapy     AM-PAC PT "6 Clicks" Mobility  Outcome Measure Help needed turning from your back to your side while in a flat bed without using bedrails?: None Help needed moving from lying on your back to sitting on the side of a flat bed without using bedrails?: None Help needed moving to and from a bed to a chair (including a wheelchair)?: None Help needed standing up from a chair using your arms (e.g., wheelchair or bedside chair)?: None Help needed to walk in hospital room?: None Help needed climbing 3-5 steps with a railing? : None 6 Click Score: 24    End of Session Equipment Utilized During Treatment: Gait belt Activity Tolerance: Patient tolerated treatment well;No increased pain Patient left: in bed;with call bell/phone within reach;with nursing/sitter in room Nurse Communication: Mobility status PT Visit Diagnosis: Other symptoms and signs involving the nervous system (R29.898)    Time: 1610-9604 PT Time Calculation (min) (ACUTE ONLY): 26 min   Charges:   PT Evaluation $PT Eval Low Complexity: 1 Low PT Treatments $Therapeutic Exercise: 8-22 mins PT General Charges $$ ACUTE PT VISIT: 1 Visit         Leean Amezcua B. Beverely Risen PT, DPT Acute Rehabilitation Services Please use secure chat or  Call Office [phone]   [employer] 06/28/2023, 3:51 PM

## 2023-06-28 NOTE — ED Triage Notes (Signed)
Pt BIBPOV pt had right sided weakness, right sided drift, and numbness around her mouth. Pt is alert and oriented, stating symptoms resolved after a "few minutes"

## 2023-06-28 NOTE — H&P (Signed)
History and Physical    Patient: Chelsea Barnett:[id number] DOB: 11/26/1946 DOA: 06/28/2023 DOS: the patient was seen and examined on 06/28/2023 PCP: Cleatis Polka., MD  Patient coming from: Home  Chief Complaint:  Chief Complaint  Patient presents with   stroke like symptoms   HPI: Chelsea Barnett is a 77 y.o. female with medical history significant of migraines with aura, psoriasis, and history of skin cancer who presents with complaints of right arm heaviness.  Patient had been in her normal state of health and had been working outside in the yard yesterday much of the day.  She reported having acute onset of right arm heaviness around 5:30-6pm.  Patient notes that when she would lift her arm up it would just fall down.  Symptoms lasted approximately a 1-2 minutes before self resolving.  She noted associated symptoms of numbness in her mouth.  She question if symptoms were related to her having started wearing a antigrind mouthguard at night when sleeping 1 week ago.  Later on that evening she had recurrence of symptoms right arm heaviness, but are also short lasting and resolved without any incident.  Denies having any significant headache, facial asymmetry, speech difficulty, palpitations, chest pain or recent falls.  Patient's last fall was about a year ago where she fell onto her left side of her face. She notes prior episode of having transient issues with her speech back in 2022, but workup did not reveal any signs of stroke.   In the emergency department patient was noted to be afebrile, pulse 56-81, respirations 15-22, blood pressures elevated to 197/90, and O2 saturation maintained on room air.  Initial CT scan head without contrast l negative for any acute abnormality.Labs were relatively unremarkable.  Neurology had been consulted to evaluate recommending completion of stroke workup.  Review of Systems: As mentioned in the history of present illness. All other systems  reviewed and are negative. Past Medical History:  Diagnosis Date   Basal cell cancer    right leg   Heart murmur    Hemorrhoids    Melanoma (HCC) 2005   on left arm   Migraines    with aura   Post-operative nausea and vomiting    after 2 surgeries   Psoriasis    Vaginal delivery    times 6   Past Surgical History:  Procedure Laterality Date   basal cell CA removal     BUNIONECTOMY Right    fallopian tube removal     laparoscopic bso- fibroma   melanoma removal     left upper arm   ovaries removed     laparoscopic bso- fibroma   REFRACTIVE SURGERY     Bil   TUBAL LIGATION  1984   WISDOM TOOTH EXTRACTION     Social History:  reports that she has never smoked. She has never used smokeless tobacco. She reports that she does not currently use alcohol. She reports that she does not use drugs.  No Known Allergies  Family History  Problem Relation Age of Onset   Heart failure Father    Heart disease Father        CVD   Melanoma Mother    Hypertension Mother    Heart disease Mother        CVD   Depression Sister    Melanoma Sister    Kidney failure Sister    Heart disease Brother        stint   Atrial  fibrillation Sister     Prior to Admission medications   Medication Sig Start Date End Date Taking? Authorizing Provider  acetaminophen (TYLENOL) 325 MG tablet Take by mouth. 07/28/20   [provider]  Apremilast (OTEZLA) 30 MG TABS Take 60 mg by mouth daily.     [provider]  clobetasol cream (TEMOVATE) 0.05 %  12/10/19   [provider]  estradiol (ESTRACE) 0.1 MG/GM vaginal cream Insert pea-sized amount of cream per vagina every other night 01/16/20   [provider]  Fluocinolone Acetonide Body 0.01 % OIL fluocinolone 0.01 % scalp oil and shower cap    [provider]  folic acid (FOLVITE) 1 MG tablet Take 1 mg by mouth daily. 07/16/20   [provider]  losartan (COZAAR) 25 MG tablet Take 1 tablet (25 mg  total) by mouth daily. 03/24/16   Cassell Clement, MD  Naproxen Sodium (ALEVE PO) Take 220 mg by mouth as needed (pain).     [provider]  omeprazole (PRILOSEC) 20 MG capsule Take 1 capsule (20 mg total) by mouth daily. 05/14/22   Palumbo, April, MD  triamcinolone cream (KENALOG) 0.1 % Apply topically 2 (two) times daily as needed. 04/10/20   [provider]  Vitamin D, Ergocalciferol, (DRISDOL) 50000 units CAPS capsule Take 50,000 Units by mouth every 7 (seven) days.    [provider]    Physical Exam: Vitals:   06/28/23 0900 06/28/23 0915 06/28/23 0930 06/28/23 0945  BP: (!) 147/73 (!) 159/75 (!) 148/69 133/70  Pulse: 60 60 (!) 56 60  Resp: 16 15 (!) 22 20  Temp:      TempSrc:      SpO2: 97% 98% 98% 97%  Weight:      Height:        Constitutional: Elderly female currently in no acute distress Eyes: PERRL, lids and conjunctivae normal ENMT: Mucous membranes are moist.   Neck: normal, supple,  Respiratory: clear to auscultation bilaterally, no wheezing, no crackles. Normal respiratory effort. No accessory muscle use.  Cardiovascular: Regular rate and rhythm, 2/6 murmur present.  Trace lower extremity edema. 2+ pedal pulses. No carotid bruits.  Abdomen: no tenderness, no masses palpated.  Bowel sounds positive.  Musculoskeletal: no clubbing / cyanosis. No joint deformity upper and lower extremities. Good ROM, no contractures. Normal muscle tone.  Skin: no rashes, lesions, ulcers. No induration Neurologic: CN 2-12 grossly intact.  Strength appears grossly intact. Psychiatric: Normal judgment and insight. Alert and oriented x 3. Normal mood.   Data Reviewed:  EKG reveals sinus rhythm at 76 bpm with paired preventricular complexes.  Reviewed labs, imaging, and pertinent records as documented in this note.  Assessment and Plan:  CVA Subarachnoid hemorrhage Acute.  Patient presents after having 2 transient episodes of right arm heaviness and reports of  mouth numbness.  Initial CT scan of brain showed no acute abnormality. MRI of the brain revealedsmall volume acute subarachnoid hemorrhage along the left frontal and parietal lobes, 3 mm acute cortical infarct within the mid left frontal lobe, and 4 mm acute cortical infarct question within the posterior left frontal lobe (versus possible artifact). -Admit to a telemetry bed -Neuro checks -Check CTA of the head and neck -Check echocardiogram(heart function noted to be preserved with grade 1 diastolic dysfunction) -PT/OT/speech to eval and treat -Follow-up telemetry -Neurology consulted, we will follow-up for any further recommendations  Essential hypertension On admission initial blood pressure elevated up to 197/90.  Home blood pressure regimen includes  losartan 25 mg daily. -Hydralazine IV for systolic blood pressure greater than 160 given acute subarachnoid hemorrhage  Hyperlipidemia Total cholesterol 230, LDL 130, and HDL 101.  Patient not on any cholesterol lowering medication at baseline -Will need to discuss need of starting a statin   History of skin cancer Patient with prior history of melanoma and basal cell carcinoma previously removed  DVT prophylaxis: SCDs due to concern for acute bleed Advance Care Planning:   Code Status: Full Code   Consults: Neurology  Family Communication: Family updated over the phone  Severity of Illness: The appropriate patient status for this patient is OBSERVATION. Observation status is judged to be reasonable and necessary in order to provide the required intensity of service to ensure the patient's safety. The patient's presenting symptoms, physical exam findings, and initial radiographic and laboratory data in the context of their medical condition is felt to place them at decreased risk for further clinical deterioration. Furthermore, it is anticipated that the patient will be medically stable for discharge from the hospital within 2 midnights  of admission.   Author: Clydie Braun, MD 06/28/2023 10:31 AM  For on call review [email or web address].

## 2023-06-28 NOTE — ED Provider Notes (Signed)
[place] [facility] AT [facility] Provider Note   CSN: [id number] Arrival date & time: 06/28/23  0747     History  Chief Complaint  Patient presents with   stroke like symptoms    Chelsea Barnett is a 77 y.o. female.  HPI Patient with history of migraine headaches, no prior stroke presents after 2 episodes of heaviness in her right arm with facial tingling.  She is currently asymptomatic, has no weakness, speech difficulty, facial asymmetry.  Daughters report that the mother reported these 2 episodes over the past 12 hours, each without precipitant, each resolving without intervention.  She has been compliant with her medications. She does note that she feels as though she is about to experience a migraine headache, but does not actually develop any head pain, nor does she have any currently.     Home Medications Prior to Admission medications   Medication Sig Start Date End Date Taking? Authorizing Provider  acetaminophen (TYLENOL) 325 MG tablet Take by mouth. 07/28/20   [provider]  Apremilast (OTEZLA) 30 MG TABS Take 60 mg by mouth daily.     [provider]  clobetasol cream (TEMOVATE) 0.05 %  12/10/19   [provider]  estradiol (ESTRACE) 0.1 MG/GM vaginal cream Insert pea-sized amount of cream per vagina every other night 01/16/20   [provider]  Fluocinolone Acetonide Body 0.01 % OIL fluocinolone 0.01 % scalp oil and shower cap    [provider]  folic acid (FOLVITE) 1 MG tablet Take 1 mg by mouth daily. 07/16/20   [provider]  losartan (COZAAR) 25 MG tablet Take 1 tablet (25 mg total) by mouth daily. 03/24/16   Cassell Clement, MD  Naproxen Sodium (ALEVE PO) Take 220 mg by mouth as needed (pain).     [provider]  omeprazole (PRILOSEC) 20 MG capsule Take 1 capsule (20 mg total) by mouth daily. 05/14/22   Palumbo, April, MD  triamcinolone cream (KENALOG) 0.1 % Apply topically  2 (two) times daily as needed. 04/10/20   [provider]  Vitamin D, Ergocalciferol, (DRISDOL) 50000 units CAPS capsule Take 50,000 Units by mouth every 7 (seven) days.    [provider]      Allergies    Patient has no known allergies.    Review of Systems   Review of Systems  All other systems reviewed and are negative.   Physical Exam Updated Vital Signs BP (!) 172/74   Pulse 60   Temp 98.2 F (36.8 C) (Oral)   Resp 14   Ht 5' 3.5" (1.613 m)   Wt 57.2 kg   LMP 12/28/1999   SpO2 98%   BMI 21.97 kg/m  Physical Exam Vitals and nursing note reviewed.  Constitutional:      General: She is not in acute distress.    Appearance: She is well-developed.  HENT:     Head: Normocephalic and atraumatic.  Eyes:     Conjunctiva/sclera: Conjunctivae normal.  Cardiovascular:     Rate and Rhythm: Normal rate and regular rhythm.  Pulmonary:     Effort: Pulmonary effort is normal. No respiratory distress.     Breath sounds: Normal breath sounds. No stridor.  Abdominal:     General: There is no distension.  Skin:    General: Skin is warm and dry.  Neurological:     General: No focal deficit present.     Mental Status: She is alert and oriented to person,  place, and time.     Cranial Nerves: No cranial nerve deficit.     Motor: No weakness.     Coordination: Coordination normal.  Psychiatric:        Mood and Affect: Mood normal.     ED Results / Procedures / Treatments   Labs (all labs ordered are listed, but only abnormal results are displayed) Labs Reviewed  COMPREHENSIVE METABOLIC PANEL - Abnormal; Notable for the following components:      Result Value   Glucose, Bld 109 (*)    All other components within normal limits  LIPID PANEL - Abnormal; Notable for the following components:   Cholesterol 249 (*)    LDL Cholesterol 130 (*)    All other components within normal limits  I-STAT CHEM 8, ED - Abnormal; Notable for the following components:    Glucose, Bld 108 (*)    Calcium, Ion 1.10 (*)    All other components within normal limits  ETHANOL  PROTIME-INR  APTT  CBC  DIFFERENTIAL  HEMOGLOBIN A1C  RAPID URINE DRUG SCREEN, HOSP PERFORMED  URINALYSIS, ROUTINE W REFLEX MICROSCOPIC    EKG EKG Interpretation Date/Time:  Tuesday June 28 2023 07:56:21 EDT Ventricular Rate:  76 PR Interval:  167 QRS Duration:  95 QT Interval:  364 QTC Calculation: 410 R Axis:   35  Text Interpretation: Sinus rhythm Paired ventricular premature complexes Low voltage, precordial leads Minimal ST depression, lateral leads Abnormal ECG Confirmed by Gerhard Munch ([id number]) on 06/28/2023 8:43:34 AM  Radiology CT ANGIO HEAD NECK W WO CM  Result Date: 06/28/2023 CLINICAL DATA:  Infarcts on same-day MRI, determine embolic source EXAM: CT ANGIOGRAPHY HEAD AND NECK WITH AND WITHOUT CONTRAST TECHNIQUE: Multidetector CT imaging of the head and neck was performed using the standard protocol during bolus administration of intravenous contrast. Multiplanar CT image reconstructions and MIPs were obtained to evaluate the vascular anatomy. Carotid stenosis measurements (when applicable) are obtained utilizing NASCET criteria, using the distal internal carotid diameter as the denominator. RADIATION DOSE REDUCTION: This exam was performed according to the departmental dose-optimization program which includes automated exposure control, adjustment of the mA and/or kV according to patient size and/or use of iterative reconstruction technique. CONTRAST:  75mL OMNIPAQUE IOHEXOL 350 MG/ML SOLN COMPARISON:  06/28/2023 CT head, 06/28/2023 MRI head FINDINGS: CT HEAD FINDINGS For noncontrast findings, please see same day CT head. CTA NECK FINDINGS Aortic arch: Variant anatomy, with a common origin of the brachiocephalic and left common carotid arteries, and the left vertebral artery originating from the aorta. Imaged portion shows no evidence of aneurysm or dissection. No significant  stenosis of the major arch vessel origins. Mild aortic atherosclerosis. Right carotid system: No evidence of stenosis, dissection, or occlusion. Left carotid system: No evidence of stenosis, dissection, or occlusion. Vertebral arteries: Right dominant system. No evidence of stenosis, dissection, or occlusion. Mild Skeleton: No acute osseous abnormality. Degenerative changes in the cervical spine. Other neck: 7 mm hypoenhancing focus in the right thyroid, for which no follow-up is currently indicated. (Reference: J Am Coll Radiol. 2015 Feb;12(2): 143-50) Upper chest: No focal pulmonary opacity or pleural effusion. Review of the MIP images confirms the above findings CTA HEAD FINDINGS Anterior circulation: Both internal carotid arteries are patent to the termini, without significant stenosis. A1 segments patent, hypoplastic on the right. Normal anterior communicating artery. Anterior cerebral arteries are patent to their distal aspects without significant stenosis. No M1 stenosis or occlusion. MCA branches perfused to their distal aspects without significant stenosis.  Posterior circulation: The left vertebral artery is diminutive and primarily supplies the left PICA, with a very small continuing left V4. The right vertebral artery is patent to the vertebrobasilar junction without significant stenosis. Posterior inferior cerebellar arteries patent proximally. Basilar patent to its distal aspect without significant stenosis. Superior cerebellar arteries patent proximally. Patent P1 segments, hypoplastic on the right. Near fetal origin of the right PCA from the right posterior communicating artery. PCAs perfused to their distal aspects without significant stenosis. The left posterior communicating artery is also patent. Venous sinuses: Well opacified. Patent. Anatomic variants: Near fetal origin of the right PCA. Review of the MIP images confirms the above findings IMPRESSION: 1. No intracranial large vessel occlusion or  significant stenosis. 2. No hemodynamically significant stenosis in the neck. 3. Aortic atherosclerosis. Aortic Atherosclerosis (ICD10-I70.0). Electronically Signed   By: Wiliam Ke M.D.   On: 06/28/2023 14:48   ECHOCARDIOGRAM COMPLETE  Result Date: 06/28/2023    ECHOCARDIOGRAM REPORT   Patient Name:   Chelsea Barnett Stolarz Date of Exam: 06/28/2023 Medical Rec #:  [id number]           Height:       63.5 in Accession #:    [id number]          Weight:       126.0 lb Date of Birth:  May 11, 1946           BSA:          1.598 m Patient Age:    77 years            BP:           168/77 mmHg Patient Gender: F                   HR:           68 bpm. Exam Location:  Inpatient Procedure: 2D Echo, Color Doppler and Cardiac Doppler Indications:    TIA  History:        Patient has prior history of Echocardiogram examinations, most                 recent 08/11/2020. TIA, Signs/Symptoms:Edema and Chest Pain; Risk                 Factors:Dyslipidemia.  Sonographer:    Milbert Coulter Referring Phys: [id number] RONDELL A SMITH IMPRESSIONS  1. Left ventricular ejection fraction, by estimation, is 60 to 65%. The left ventricle has normal function. The left ventricle has no regional wall motion abnormalities. Left ventricular diastolic parameters are consistent with Grade I diastolic dysfunction (impaired relaxation).  2. Right ventricular systolic function is normal. The right ventricular size is normal.  3. The mitral valve is abnormal. Trivial mitral valve regurgitation. No evidence of mitral stenosis.  4. The aortic valve is tricuspid. There is mild calcification of the aortic valve. There is mild thickening of the aortic valve. Aortic valve regurgitation is mild. Aortic valve sclerosis is present, with no evidence of aortic valve stenosis.  5. The inferior vena cava is normal in size with greater than 50% respiratory variability, suggesting right atrial pressure of 3 mmHg. FINDINGS  Left Ventricle: Left ventricular ejection fraction, by  estimation, is 60 to 65%. The left ventricle has normal function. The left ventricle has no regional wall motion abnormalities. The left ventricular internal cavity size was normal in size. There is  no left ventricular hypertrophy. Left ventricular diastolic parameters are consistent with Grade I diastolic dysfunction (impaired  relaxation). Right Ventricle: The right ventricular size is normal. No increase in right ventricular wall thickness. Right ventricular systolic function is normal. Left Atrium: Left atrial size was normal in size. Right Atrium: Right atrial size was normal in size. Pericardium: There is no evidence of pericardial effusion. Mitral Valve: The mitral valve is abnormal. There is mild thickening of the mitral valve leaflet(s). There is mild calcification of the mitral valve leaflet(s). Trivial mitral valve regurgitation. No evidence of mitral valve stenosis. Tricuspid Valve: The tricuspid valve is normal in structure. Tricuspid valve regurgitation is trivial. No evidence of tricuspid stenosis. Aortic Valve: The aortic valve is tricuspid. There is mild calcification of the aortic valve. There is mild thickening of the aortic valve. Aortic valve regurgitation is mild. Aortic valve sclerosis is present, with no evidence of aortic valve stenosis. Aortic valve mean gradient measures 4.0 mmHg. Aortic valve peak gradient measures 7.2 mmHg. Pulmonic Valve: The pulmonic valve was normal in structure. Pulmonic valve regurgitation is not visualized. No evidence of pulmonic stenosis. Aorta: The aortic root is normal in size and structure. Venous: The inferior vena cava is normal in size with greater than 50% respiratory variability, suggesting right atrial pressure of 3 mmHg. IAS/Shunts: No atrial level shunt detected by color flow Doppler.  LEFT VENTRICLE PLAX 2D LVIDd:         4.30 cm Diastology LVIDs:         3.00 cm LV e' medial:    5.66 cm/s LV PW:         1.00 cm LV E/e' medial:  8.5 LV IVS:         1.00 cm LV e' lateral:   7.72 cm/s                        LV E/e' lateral: 6.2  RIGHT VENTRICLE RV Basal diam:  3.60 cm RV Mid diam:    2.80 cm RV S prime:     17.20 cm/s TAPSE (M-mode): 3.0 cm LEFT ATRIUM             Index        RIGHT ATRIUM           Index LA diam:        3.20 cm 2.00 cm/m   RA Area:     16.50 cm LA Vol (A2C):   48.8 ml 30.53 ml/m  RA Volume:   42.80 ml  26.78 ml/m LA Vol (A4C):   48.2 ml 30.16 ml/m LA Biplane Vol: 51.4 ml 32.16 ml/m  AORTIC VALVE AV Vmax:           134.00 cm/s AV Vmean:          89.900 cm/s AV VTI:            0.277 m AV Peak Grad:      7.2 mmHg AV Mean Grad:      4.0 mmHg LVOT Vmax:         123.00 cm/s LVOT Vmean:        82.900 cm/s LVOT VTI:          0.259 m LVOT/AV VTI ratio: 0.94  AORTA Ao Root diam: 3.00 cm MITRAL VALVE MV Area (PHT): 2.82 cm    SHUNTS MV Decel Time: 269 msec    Systemic VTI: 0.26 m MV E velocity: 48.00 cm/s MV A velocity: 82.30 cm/s MV E/A ratio:  0.58 Charlton Haws MD Electronically signed by Charlton Haws MD Signature  Date/Time: 06/28/2023/12:45:41 PM    Final    MR Brain Wo Contrast (neuro protocol)  Addendum Date: 06/28/2023   ADDENDUM REPORT: 06/28/2023 11:27 ADDENDUM: These results were called by telephone at the time of interpretation on 06/28/2023 at 11:23 am to provider Gerhard Munch , who verbally acknowledged these results. Electronically Signed   By: Jackey Loge D.O.   On: 06/28/2023 11:27   Result Date: 06/28/2023 CLINICAL DATA:  Provided history: Transient ischemic attack (TIA). Right upper extremity weakness, intermittent. EXAM: MRI HEAD WITHOUT CONTRAST TECHNIQUE: Multiplanar, multiecho pulse sequences of the brain and surrounding structures were obtained without intravenous contrast. COMPARISON:  Head CT 06/28/2023. FINDINGS: Brain: No age advanced or lobar predominant parenchymal atrophy. 3 mm acute cortical infarct within the mid left frontal lobe (left MCA vascular territory). An additional 4 mm acute cortical infarct is  questioned within the posterior left frontal lobe (MCA vascular territory) (series 2, image 33). Small-volume acute subarachnoid hemorrhage scattered along the mid-to-posterior left frontal lobe and left parietal lobe. In retrospect, this was present on the head CT performed earlier today, but is seen to much better advantage on the current exam. Multifocal T2 FLAIR hyperintense signal abnormality within the cerebral white matter, nonspecific but compatible with mild chronic small vessel ischemic disease. There are a few nonspecific punctate chronic microhemorrhages scattered within the supratentorial brain. Partially empty sella turcica. No evidence of an intracranial mass. No midline shift or hydrocephalus. Vascular: Maintained flow voids within the proximal large arterial vessels. Dominant right vertebral artery. Skull and upper cervical spine: No focal suspicious marrow lesion. Sinuses/Orbits: No mass or acute finding within the imaged orbits. No significant paranasal sinus disease. IMPRESSION: 1. Small-volume acute subarachnoid hemorrhage along the left frontal and parietal lobes. 2. 3 mm acute cortical infarct within the mid left frontal lobe (left MCA territory). 3. 4 mm acute cortical infarct questioned within the posterior left frontal lobe (versus artifact from adjacent subarachnoid hemorrhage). 4. Mild chronic small ischemic changes within the cerebral white matter. 5. Few nonspecific punctate chronic microhemorrhages scattered within the supratentorial brain. Electronically Signed: By: Jackey Loge D.O. On: 06/28/2023 11:01   CT HEAD WO CONTRAST  Result Date: 06/28/2023 CLINICAL DATA:  Right-sided weakness and right-sided drift, numbness around the mouth. Symptoms now resolved. EXAM: CT HEAD WITHOUT CONTRAST TECHNIQUE: Contiguous axial images were obtained from the base of the skull through the vertex without intravenous contrast. RADIATION DOSE REDUCTION: This exam was performed according to the  departmental dose-optimization program which includes automated exposure control, adjustment of the mA and/or kV according to patient size and/or use of iterative reconstruction technique. COMPARISON:  Brain MRI 03/29/2021 FINDINGS: Brain: There is no acute intracranial hemorrhage, extra-axial fluid collection, or acute infarct. Parenchymal volume is normal for age. The ventricles are normal in size. Gray-white differentiation is preserved. Patchy hypodensity in the supratentorial white matter likely reflects sequela of underlying chronic small-vessel ischemic change. The pituitary and suprasellar region are normal. There is no mass lesion. There is no mass effect or midline shift. Vascular: No hyperdense vessel or unexpected calcification. Skull: Normal. Negative for fracture or focal lesion. Sinuses/Orbits: The imaged paranasal sinuses are clear. The globes and orbits are unremarkable. Other: The mastoid air cells and middle ear cavities are clear. IMPRESSION: No acute intracranial pathology. Electronically Signed   By: Lesia Hausen M.D.   On: 06/28/2023 08:56    Procedures Procedures    Medications Ordered in ED Medications   stroke: early stages of recovery book (has no  administration in time range)  0.9 %  sodium chloride infusion ( Intravenous New Bag/Given 06/28/23 1245)  acetaminophen (TYLENOL) tablet 650 mg (has no administration in time range)    Or  acetaminophen (TYLENOL) 160 MG/5ML solution 650 mg (has no administration in time range)    Or  acetaminophen (TYLENOL) suppository 650 mg (has no administration in time range)  senna-docusate (Senokot-S) tablet 1 tablet (has no administration in time range)  hydrALAZINE (APRESOLINE) injection 10 mg (10 mg Intravenous Given 06/28/23 1525)  iohexol (OMNIPAQUE) 350 MG/ML injection 75 mL (75 mLs Intravenous Contrast Given 06/28/23 1431)    ED Course/ Medical Decision Making/ A&P                             Medical Decision Making Elderly female  with history of hypercholesterolemia, migraine headaches presents after 2 episodes of arm heaviness, facial paresthesia, both resolved.  Differential includes atypical migraine, TIA, electrolyte abnormalities. Patient had expeditious evaluation including CT, x-ray, labs, monitoring. Cardiac 75 sinus normal Pulse ox 100% room air normal   Amount and/or Complexity of Data Reviewed Independent Historian:     Details: 2 daughters at bedside Labs: ordered. Decision-making details documented in ED Course. Radiology: ordered and independent interpretation performed. Decision-making details documented in ED Course. ECG/medicine tests: ordered and independent interpretation performed. Decision-making details documented in ED Course.  Risk Prescription drug management. Decision regarding hospitalization. Diagnosis or treatment significantly limited by social determinants of health.   Update: Patient in similar condition.  I reviewed her CT, discussed it with our radiologist and with her neurologist.  Neurology seen the patient, but I did circle back after imaging tests were available. CT with findings consistent with multiple small hemorrhage as well as subdural, and pattern possibly consistent with myeloid, microhemorrhage. Potentially this may have contributed to some epileptic genesis, explaining the patient's right arm symptoms. Patient admitted to internal medicine team, neurology following as a consulting service.          Final Clinical Impression(s) / ED Diagnoses Final diagnoses:  Weakness    CRITICAL CARE Performed by: Gerhard Munch Total critical care time: 35 minutes Critical care time was exclusive of separately billable procedures and treating other patients. Critical care was necessary to treat or prevent imminent or life-threatening deterioration. Critical care was time spent personally by me on the following activities: development of treatment plan with patient  and/or surrogate as well as nursing, discussions with consultants, evaluation of patient's response to treatment, examination of patient, obtaining history from patient or surrogate, ordering and performing treatments and interventions, ordering and review of laboratory studies, ordering and review of radiographic studies, pulse oximetry and re-evaluation of patient's condition.    Gerhard Munch, MD 06/28/23 [phone]

## 2023-06-28 NOTE — Plan of Care (Signed)

## 2023-06-29 ENCOUNTER — Other Ambulatory Visit (HOSPITAL_COMMUNITY): Payer: Self-pay

## 2023-06-29 ENCOUNTER — Observation Stay (HOSPITAL_COMMUNITY): Payer: Medicare Other

## 2023-06-29 DIAGNOSIS — R531 Weakness: Secondary | ICD-10-CM | POA: Diagnosis not present

## 2023-06-29 DIAGNOSIS — R29898 Other symptoms and signs involving the musculoskeletal system: Secondary | ICD-10-CM | POA: Diagnosis not present

## 2023-06-29 DIAGNOSIS — R569 Unspecified convulsions: Secondary | ICD-10-CM

## 2023-06-29 MED ORDER — ASPIRIN 81 MG PO CHEW
81.0000 mg | CHEWABLE_TABLET | Freq: Every day | ORAL | 0 refills | Status: AC
  Filled 2023-06-29: qty 90, 90d supply, fill #0

## 2023-06-29 MED ORDER — ROSUVASTATIN CALCIUM 10 MG PO TABS
10.0000 mg | ORAL_TABLET | Freq: Every day | ORAL | 0 refills | Status: DC
  Filled 2023-06-29: qty 30, 30d supply, fill #0

## 2023-06-29 MED ORDER — ROSUVASTATIN CALCIUM 5 MG PO TABS
10.0000 mg | ORAL_TABLET | Freq: Every day | ORAL | Status: DC
  Administered 2023-06-29: 10 mg via ORAL
  Filled 2023-06-29: qty 2

## 2023-06-29 MED ORDER — LOSARTAN POTASSIUM 25 MG PO TABS: 25.0000 mg | ORAL_TABLET | Freq: Every day | ORAL | 2 refills | Status: DC

## 2023-06-29 MED ORDER — ASPIRIN 81 MG PO CHEW
81.0000 mg | CHEWABLE_TABLET | Freq: Every day | ORAL | Status: DC
  Administered 2023-06-29: 81 mg via ORAL
  Filled 2023-06-29: qty 1

## 2023-06-29 NOTE — Progress Notes (Signed)
EEG complete - results pending 

## 2023-06-29 NOTE — Discharge Summary (Signed)
Chelsea Barnett ZOX:[id number] DOB: [birth year] DOA: 06/28/2023  PCP: Cleatis Polka., MD  Admit date: 06/28/2023  Discharge date: 06/29/2023  Admitted From: Home   Disposition:  Home  Recommendations for Outpatient Follow-up:   Follow up with PCP in 1-2 weeks  PCP Please obtain BMP/CBC, 2 view CXR in 1week,  (see Discharge instructions)   PCP Please follow up on the following pending results:    Home Health: None   Equipment/Devices: None  Consultations: Neuro Discharge Condition: Stable    CODE STATUS: Full    Diet Recommendation: Heart Healthy   Chief Complaint  Patient presents with   stroke like symptoms     Brief history of present illness from the day of admission and additional interim summary     77 y.o. female with medical history significant of migraines with aura, psoriasis, and history of skin cancer who presents with complaints of right arm heaviness.  Further workup including MRI was Consistent with CVA and she was admitted to the hospital for further care.                                                                 Hospital Course   Ischemic CVA with small subarachnoid hemorrhage - full stroke workup was done, case discussed with Dr. Pearlean Brownie, because of CVA could be embolic versus amyloid angiopathy, she also have minimal cognitive deficit, per Dr. Darien Ramus patient will be placed on aspirin, continue Crestor, full stroke workup done.  Will be discharged home on these 2 medications with close outpatient follow-up with PCP and neurology, she is also going to get 30-day event monitor which has been requested by cardiology.  Hypertension.  For now permissive hypertension, resume ARB in 48 hours at home.  Dyslipidemia.  LDL above goal started on statin.  History of skin cancer.  Patient has  history of melanoma and basal cell carcinoma, follow-up with PCP and primary dermatologist postdischarge.  Mild cognitive deficit per neurology team.  Outpatient follow-up with neurology.  EEG stable as well, will be discharged home.    Discharge diagnosis     Principal Problem:   Right arm weakness Active Problems:   CVA (cerebral vascular accident) (HCC)   Nontraumatic subarachnoid hemorrhage (HCC)   Essential hypertension   Pure hypercholesterolemia   History of skin cancer    Discharge instructions    Discharge Instructions     Ambulatory referral to Neurology   Complete by: As directed    An appointment is requested in approximately: 8 weeks   Diet - low sodium heart healthy   Complete by: As directed    Discharge instructions   Complete by: As directed    Follow with Primary MD Cleatis Polka., MD in 7 days   Get CBC, CMP, Magnesium -  checked next visit with your primary MD    Activity: As tolerated with Full fall precautions use walker/cane & assistance as needed  Disposition Home    Diet: Heart Healthy    Special Instructions: If you have smoked or chewed Tobacco  in the last 2 yrs please stop smoking, stop any regular Alcohol  and or any Recreational drug use.  On your next visit with your primary care physician please Get Medicines reviewed and adjusted.  Please request your Prim.MD to go over all Hospital Tests and Procedure/Radiological results at the follow up, please get all Hospital records sent to your Prim MD by signing hospital release before you go home.  If you experience worsening of your admission symptoms, develop shortness of breath, life threatening emergency, suicidal or homicidal thoughts you must seek medical attention immediately by calling 911 or calling your MD immediately  if symptoms less severe.  You Must read complete instructions/literature along with all the possible adverse reactions/side effects for all the Medicines you take  and that have been prescribed to you. Take any new Medicines after you have completely understood and accpet all the possible adverse reactions/side effects.   Increase activity slowly   Complete by: As directed        Discharge Medications   Allergies as of 06/29/2023   No Known Allergies      Medication List     TAKE these medications    acetaminophen 325 MG tablet Commonly known as: TYLENOL Take 650 mg by mouth as needed for headache.   aspirin 81 MG chewable tablet Chew 1 tablet (81 mg total) by mouth daily. Start taking on: June 30, 2023   clobetasol cream 0.05 % Commonly known as: TEMOVATE Apply 1 Application topically as needed (psoriasis).   estradiol 0.1 MG/GM vaginal cream Commonly known as: ESTRACE Place 1 Applicatorful vaginally as needed (dryness).   Fluocinolone Acetonide Body 0.01 % Oil Apply 1 Application topically as needed (psoriasis). To scalp   losartan 25 MG tablet Commonly known as: COZAAR Take 1 tablet (25 mg total) by mouth daily. Start taking on: June 30, 2023 What changed: when to take this   multivitamin with minerals tablet Take 1 tablet by mouth daily.   mupirocin ointment 2 % Commonly known as: BACTROBAN Apply 1 Application topically as needed (cuts).   Otezla 30 MG Tabs Generic drug: Apremilast Take 30 mg by mouth at bedtime.   rosuvastatin 10 MG tablet Commonly known as: CRESTOR Take 1 tablet (10 mg total) by mouth daily. Start taking on: June 30, 2023   triamcinolone cream 0.1 % Commonly known as: KENALOG Apply 1 Application topically as needed (psoriasis).   Vitamin D (Ergocalciferol) 1.25 MG (50000 UNIT) Caps capsule Commonly known as: DRISDOL Take 50,000 Units by mouth every 7 (seven) days.         Follow-up Information     Micki Riley, MD. Schedule an appointment as soon as possible for a visit in 8 week(s).   Specialties: Neurology, Radiology Contact information: [address] [place]  [place] [address] [phone]         Laurey Morale, MD. Schedule an appointment as soon as possible for a visit in 1 week(s).   Specialty: Cardiology Contact information: [address] [place] [address] [place] [address] [phone]         Cleatis Polka., MD. Schedule an appointment as soon as possible for a visit in 1 week(s).   Specialty: Internal  Medicine Contact information: [address] [place] [address] [phone]                 Major procedures and Radiology Reports - PLEASE review detailed and final reports thoroughly  -          EEG adult  Result Date: 06/29/2023 Charlsie Quest, MD     06/29/2023 12:05 PM Patient Name: Chelsea Barnett MRN: [id number] Epilepsy Attending: Charlsie Quest Referring Physician/Provider: Elmer Picker, NP Date: 06/29/2023 Duration: 23.11 mins Patient history: 77 y.o. female presenting with 2 episodes of R arm weakness now resolved and found to have v small acute cortical infarct L frontal lobe and small volume SAH along L frontal and parietal lobes. EEG to evaluate for seizure. Level of alertness: Awake AEDs during EEG study: None Technical aspects: This EEG study was done with scalp electrodes positioned according to the 10-20 International system of electrode placement. Electrical activity was reviewed with band pass filter of 1-70Hz , sensitivity of 7 uV/mm, display speed of 81mm/sec with a 60Hz  notched filter applied as appropriate. EEG data were recorded continuously and digitally stored.  Video monitoring was available and reviewed as appropriate. Description: The posterior dominant rhythm consists of 8-9 Hz activity of moderate voltage (25-35 uV) seen predominantly in posterior head regions, symmetric and reactive to eye opening and eye closing. Physiologic photic driving was seen during photic stimulation.  Hyperventilation was not performed.   IMPRESSION: This study is within normal limits. No seizures or  epileptiform discharges were seen throughout the recording. A normal interictal EEG does not exclude the diagnosis of epilepsy. Charlsie Quest   CT ANGIO HEAD NECK W WO CM  Result Date: 06/28/2023 CLINICAL DATA:  Infarcts on same-day MRI, determine embolic source EXAM: CT ANGIOGRAPHY HEAD AND NECK WITH AND WITHOUT CONTRAST TECHNIQUE: Multidetector CT imaging of the head and neck was performed using the standard protocol during bolus administration of intravenous contrast. Multiplanar CT image reconstructions and MIPs were obtained to evaluate the vascular anatomy. Carotid stenosis measurements (when applicable) are obtained utilizing NASCET criteria, using the distal internal carotid diameter as the denominator. RADIATION DOSE REDUCTION: This exam was performed according to the departmental dose-optimization program which includes automated exposure control, adjustment of the mA and/or kV according to patient size and/or use of iterative reconstruction technique. CONTRAST:  75mL OMNIPAQUE IOHEXOL 350 MG/ML SOLN COMPARISON:  06/28/2023 CT head, 06/28/2023 MRI head FINDINGS: CT HEAD FINDINGS For noncontrast findings, please see same day CT head. CTA NECK FINDINGS Aortic arch: Variant anatomy, with a common origin of the brachiocephalic and left common carotid arteries, and the left vertebral artery originating from the aorta. Imaged portion shows no evidence of aneurysm or dissection. No significant stenosis of the major arch vessel origins. Mild aortic atherosclerosis. Right carotid system: No evidence of stenosis, dissection, or occlusion. Left carotid system: No evidence of stenosis, dissection, or occlusion. Vertebral arteries: Right dominant system. No evidence of stenosis, dissection, or occlusion. Mild Skeleton: No acute osseous abnormality. Degenerative changes in the cervical spine. Other neck: 7 mm hypoenhancing focus in the right thyroid, for which no follow-up is currently indicated. (Reference: J Am  Coll Radiol. 2015 Feb;12(2): 143-50) Upper chest: No focal pulmonary opacity or pleural effusion. Review of the MIP images confirms the above findings CTA HEAD FINDINGS Anterior circulation: Both internal carotid arteries are patent to the termini, without significant stenosis. A1 segments patent, hypoplastic on the right. Normal anterior communicating artery. Anterior cerebral arteries are patent to  their distal aspects without significant stenosis. No M1 stenosis or occlusion. MCA branches perfused to their distal aspects without significant stenosis. Posterior circulation: The left vertebral artery is diminutive and primarily supplies the left PICA, with a very small continuing left V4. The right vertebral artery is patent to the vertebrobasilar junction without significant stenosis. Posterior inferior cerebellar arteries patent proximally. Basilar patent to its distal aspect without significant stenosis. Superior cerebellar arteries patent proximally. Patent P1 segments, hypoplastic on the right. Near fetal origin of the right PCA from the right posterior communicating artery. PCAs perfused to their distal aspects without significant stenosis. The left posterior communicating artery is also patent. Venous sinuses: Well opacified. Patent. Anatomic variants: Near fetal origin of the right PCA. Review of the MIP images confirms the above findings IMPRESSION: 1. No intracranial large vessel occlusion or significant stenosis. 2. No hemodynamically significant stenosis in the neck. 3. Aortic atherosclerosis. Aortic Atherosclerosis (ICD10-I70.0). Electronically Signed   By: Wiliam Ke M.D.   On: 06/28/2023 14:48   ECHOCARDIOGRAM COMPLETE  Result Date: 06/28/2023    ECHOCARDIOGRAM REPORT   Patient Name:   Chelsea Barnett Date of Exam: 06/28/2023 Medical Rec #:  [id number]           Height:       63.5 in Accession #:    [id number]          Weight:       126.0 lb Date of Birth:  Jul 19, 1946           BSA:           1.598 m Patient Age:    77 years            BP:           168/77 mmHg Patient Gender: F                   HR:           68 bpm. Exam Location:  Inpatient Procedure: 2D Echo, Color Doppler and Cardiac Doppler Indications:    TIA  History:        Patient has prior history of Echocardiogram examinations, most                 recent 08/11/2020. TIA, Signs/Symptoms:Edema and Chest Pain; Risk                 Factors:Dyslipidemia.  Sonographer:    Milbert Coulter Referring Phys: [id number] RONDELL A SMITH IMPRESSIONS  1. Left ventricular ejection fraction, by estimation, is 60 to 65%. The left ventricle has normal function. The left ventricle has no regional wall motion abnormalities. Left ventricular diastolic parameters are consistent with Grade I diastolic dysfunction (impaired relaxation).  2. Right ventricular systolic function is normal. The right ventricular size is normal.  3. The mitral valve is abnormal. Trivial mitral valve regurgitation. No evidence of mitral stenosis.  4. The aortic valve is tricuspid. There is mild calcification of the aortic valve. There is mild thickening of the aortic valve. Aortic valve regurgitation is mild. Aortic valve sclerosis is present, with no evidence of aortic valve stenosis.  5. The inferior vena cava is normal in size with greater than 50% respiratory variability, suggesting right atrial pressure of 3 mmHg. FINDINGS  Left Ventricle: Left ventricular ejection fraction, by estimation, is 60 to 65%. The left ventricle has normal function. The left ventricle has no regional wall motion abnormalities. The left ventricular internal cavity size was normal in  size. There is  no left ventricular hypertrophy. Left ventricular diastolic parameters are consistent with Grade I diastolic dysfunction (impaired relaxation). Right Ventricle: The right ventricular size is normal. No increase in right ventricular wall thickness. Right ventricular systolic function is normal. Left Atrium: Left atrial  size was normal in size. Right Atrium: Right atrial size was normal in size. Pericardium: There is no evidence of pericardial effusion. Mitral Valve: The mitral valve is abnormal. There is mild thickening of the mitral valve leaflet(s). There is mild calcification of the mitral valve leaflet(s). Trivial mitral valve regurgitation. No evidence of mitral valve stenosis. Tricuspid Valve: The tricuspid valve is normal in structure. Tricuspid valve regurgitation is trivial. No evidence of tricuspid stenosis. Aortic Valve: The aortic valve is tricuspid. There is mild calcification of the aortic valve. There is mild thickening of the aortic valve. Aortic valve regurgitation is mild. Aortic valve sclerosis is present, with no evidence of aortic valve stenosis. Aortic valve mean gradient measures 4.0 mmHg. Aortic valve peak gradient measures 7.2 mmHg. Pulmonic Valve: The pulmonic valve was normal in structure. Pulmonic valve regurgitation is not visualized. No evidence of pulmonic stenosis. Aorta: The aortic root is normal in size and structure. Venous: The inferior vena cava is normal in size with greater than 50% respiratory variability, suggesting right atrial pressure of 3 mmHg. IAS/Shunts: No atrial level shunt detected by color flow Doppler.  LEFT VENTRICLE PLAX 2D LVIDd:         4.30 cm Diastology LVIDs:         3.00 cm LV e' medial:    5.66 cm/s LV PW:         1.00 cm LV E/e' medial:  8.5 LV IVS:        1.00 cm LV e' lateral:   7.72 cm/s                        LV E/e' lateral: 6.2  RIGHT VENTRICLE RV Basal diam:  3.60 cm RV Mid diam:    2.80 cm RV S prime:     17.20 cm/s TAPSE (M-mode): 3.0 cm LEFT ATRIUM             Index        RIGHT ATRIUM           Index LA diam:        3.20 cm 2.00 cm/m   RA Area:     16.50 cm LA Vol (A2C):   48.8 ml 30.53 ml/m  RA Volume:   42.80 ml  26.78 ml/m LA Vol (A4C):   48.2 ml 30.16 ml/m LA Biplane Vol: 51.4 ml 32.16 ml/m  AORTIC VALVE AV Vmax:           134.00 cm/s AV Vmean:           89.900 cm/s AV VTI:            0.277 m AV Peak Grad:      7.2 mmHg AV Mean Grad:      4.0 mmHg LVOT Vmax:         123.00 cm/s LVOT Vmean:        82.900 cm/s LVOT VTI:          0.259 m LVOT/AV VTI ratio: 0.94  AORTA Ao Root diam: 3.00 cm MITRAL VALVE MV Area (PHT): 2.82 cm    SHUNTS MV Decel Time: 269 msec    Systemic VTI: 0.26 m MV E velocity: 48.00  cm/s MV A velocity: 82.30 cm/s MV E/A ratio:  0.58 Charlton Haws MD Electronically signed by Charlton Haws MD Signature Date/Time: 06/28/2023/12:45:41 PM    Final    MR Brain Wo Contrast (neuro protocol)  Addendum Date: 06/28/2023   ADDENDUM REPORT: 06/28/2023 11:27 ADDENDUM: These results were called by telephone at the time of interpretation on 06/28/2023 at 11:23 am to provider Gerhard Munch , who verbally acknowledged these results. Electronically Signed   By: Jackey Loge D.O.   On: 06/28/2023 11:27   Result Date: 06/28/2023 CLINICAL DATA:  Provided history: Transient ischemic attack (TIA). Right upper extremity weakness, intermittent. EXAM: MRI HEAD WITHOUT CONTRAST TECHNIQUE: Multiplanar, multiecho pulse sequences of the brain and surrounding structures were obtained without intravenous contrast. COMPARISON:  Head CT 06/28/2023. FINDINGS: Brain: No age advanced or lobar predominant parenchymal atrophy. 3 mm acute cortical infarct within the mid left frontal lobe (left MCA vascular territory). An additional 4 mm acute cortical infarct is questioned within the posterior left frontal lobe (MCA vascular territory) (series 2, image 33). Small-volume acute subarachnoid hemorrhage scattered along the mid-to-posterior left frontal lobe and left parietal lobe. In retrospect, this was present on the head CT performed earlier today, but is seen to much better advantage on the current exam. Multifocal T2 FLAIR hyperintense signal abnormality within the cerebral white matter, nonspecific but compatible with mild chronic small vessel ischemic disease. There are a few  nonspecific punctate chronic microhemorrhages scattered within the supratentorial brain. Partially empty sella turcica. No evidence of an intracranial mass. No midline shift or hydrocephalus. Vascular: Maintained flow voids within the proximal large arterial vessels. Dominant right vertebral artery. Skull and upper cervical spine: No focal suspicious marrow lesion. Sinuses/Orbits: No mass or acute finding within the imaged orbits. No significant paranasal sinus disease. IMPRESSION: 1. Small-volume acute subarachnoid hemorrhage along the left frontal and parietal lobes. 2. 3 mm acute cortical infarct within the mid left frontal lobe (left MCA territory). 3. 4 mm acute cortical infarct questioned within the posterior left frontal lobe (versus artifact from adjacent subarachnoid hemorrhage). 4. Mild chronic small ischemic changes within the cerebral white matter. 5. Few nonspecific punctate chronic microhemorrhages scattered within the supratentorial brain. Electronically Signed: By: Jackey Loge D.O. On: 06/28/2023 11:01   CT HEAD WO CONTRAST  Result Date: 06/28/2023 CLINICAL DATA:  Right-sided weakness and right-sided drift, numbness around the mouth. Symptoms now resolved. EXAM: CT HEAD WITHOUT CONTRAST TECHNIQUE: Contiguous axial images were obtained from the base of the skull through the vertex without intravenous contrast. RADIATION DOSE REDUCTION: This exam was performed according to the departmental dose-optimization program which includes automated exposure control, adjustment of the mA and/or kV according to patient size and/or use of iterative reconstruction technique. COMPARISON:  Brain MRI 03/29/2021 FINDINGS: Brain: There is no acute intracranial hemorrhage, extra-axial fluid collection, or acute infarct. Parenchymal volume is normal for age. The ventricles are normal in size. Gray-white differentiation is preserved. Patchy hypodensity in the supratentorial white matter likely reflects sequela of  underlying chronic small-vessel ischemic change. The pituitary and suprasellar region are normal. There is no mass lesion. There is no mass effect or midline shift. Vascular: No hyperdense vessel or unexpected calcification. Skull: Normal. Negative for fracture or focal lesion. Sinuses/Orbits: The imaged paranasal sinuses are clear. The globes and orbits are unremarkable. Other: The mastoid air cells and middle ear cavities are clear. IMPRESSION: No acute intracranial pathology. Electronically Signed   By: Lesia Hausen M.D.   On: 06/28/2023 08:56  Micro Results     No results found for this or any previous visit (from the past 240 hour(s)).  Today   Subjective    Chelsea Barnett today has no headache,no chest abdominal pain,no new weakness tingling or numbness, feels much better wants to go home today.      Objective   Blood pressure (!) 143/80, pulse 76, temperature 98.1 F (36.7 C), temperature source Oral, resp. rate 19, height 5' 3.5" (1.613 m), weight 57.2 kg, last menstrual period 12/28/1999, SpO2 98 %.   Intake/Output Summary (Last 24 hours) at 06/29/2023 1211 Last data filed at 06/28/2023 1545 Gross per 24 hour  Intake 120 ml  Output --  Net 120 ml    Exam  Awake Alert, No new F.N deficits,    [place].AT,PERRAL Supple Neck,   Symmetrical Chest wall movement, Good air movement bilaterally, CTAB RRR,No Gallops,   +ve B.Sounds, Abd Soft, Non tender,  No Cyanosis, Clubbing or edema    Data Review   Recent Labs  Lab 06/28/23 0816 06/28/23 0824  WBC 4.5  --   HGB 13.4 13.6  HCT 40.8 40.0  PLT 259  --   MCV 92.1  --   MCH 30.2  --   MCHC 32.8  --   RDW 12.6  --   LYMPHSABS 1.3  --   MONOABS 0.4  --   EOSABS 0.0  --   BASOSABS 0.1  --     Recent Labs  Lab 06/28/23 0816 06/28/23 0824  NA 139 140  K 3.7 3.7  CL 103 106  CO2 22  --   ANIONGAP 14  --   GLUCOSE 109* 108*  BUN 16 17  CREATININE 0.77 0.80  AST 19  --   ALT 14  --   ALKPHOS 55  --    BILITOT 0.7  --   ALBUMIN 4.1  --   INR 1.0  --   HGBA1C 5.4  --   CALCIUM 9.3  --     Total Time in preparing paper work, data evaluation and todays exam - 35 minutes  Signature  -    Susa Raring M.D on 06/29/2023 at 12:11 PM   -  To page go to [email or web address]

## 2023-06-29 NOTE — Progress Notes (Signed)
OT Cancellation Note and OT Screen  Patient Details Name: Chelsea Barnett MRN: [id number] DOB: 1946-10-17   Cancelled Treatment:    Reason Eval/Treat Not Completed: OT screened, no needs identified, will sign off (Per discussion with PT and RN and pt screen, pt is at baseline with pt demonstrating ability to complete ADLs and functional mobility Independently. No acute skilled OT needs identified. No benefit from skilled OT services at this time. OT signing off.)  Anterrio Mccleery "Orson Eva., OTR/L, MA Acute Rehab [phone]   Lendon Colonel 06/29/2023, 11:09 AM

## 2023-06-29 NOTE — Plan of Care (Signed)

## 2023-06-29 NOTE — Discharge Instructions (Signed)
Follow with Primary MD Cleatis Polka., MD in 7 days   Get CBC, CMP, Magnesium -  checked next visit with your primary MD    Activity: As tolerated with Full fall precautions use walker/cane & assistance as needed  Disposition Home    Diet: Heart Healthy    Special Instructions: If you have smoked or chewed Tobacco  in the last 2 yrs please stop smoking, stop any regular Alcohol  and or any Recreational drug use.  On your next visit with your primary care physician please Get Medicines reviewed and adjusted.  Please request your Prim.MD to go over all Hospital Tests and Procedure/Radiological results at the follow up, please get all Hospital records sent to your Prim MD by signing hospital release before you go home.  If you experience worsening of your admission symptoms, develop shortness of breath, life threatening emergency, suicidal or homicidal thoughts you must seek medical attention immediately by calling 911 or calling your MD immediately  if symptoms less severe.  You Must read complete instructions/literature along with all the possible adverse reactions/side effects for all the Medicines you take and that have been prescribed to you. Take any new Medicines after you have completely understood and accpet all the possible adverse reactions/side effects.

## 2023-06-29 NOTE — TOC Transition Note (Signed)
Transition of Care [facility]) - CM/SW Discharge Note   Patient Details  Name: Chelsea Barnett MRN: [id number] Date of Birth: 1946/06/09  Transition of Care [facility]) CM/SW Contact:  Gordy Clement, RN Phone Number: 06/29/2023, 12:27 PM   Clinical Narrative:     Patient to dc to home with Husband. There are no TOC needs.            Patient Goals and CMS Choice      Discharge Placement                         Discharge Plan and Services Additional resources added to the After Visit Summary for                                       Social Determinants of Health (SDOH) Interventions SDOH Screenings   Food Insecurity: No Food Insecurity (06/28/2023)  Housing: Low Risk  (06/28/2023)  Transportation Needs: No Transportation Needs (06/28/2023)  Utilities: Not At Risk (06/28/2023)  Tobacco Use: Low Risk  (06/28/2023)     Readmission Risk Interventions     No data to display

## 2023-06-29 NOTE — Progress Notes (Addendum)
STROKE TEAM PROGRESS NOTE   BRIEF HPI [name] is a 77 y.o. female with history of  migraines with aura, psoriasis, aortic insufficiency, and history of skin cancer who presents with complaints of right arm heaviness. She was working out in the yard yesterday when she had an episode of right arm heaviness starting around 1730. The symptoms resolved after approximately 1-2 minutes. She had a recurrence of symptoms later in the evening that last a couple of minutes as well.    MRI brain showed small volume acute SAH along L frontal and parietal lobes and 3mm acute cortical infarct in L MCA terrtiroy.  A few right frontal and right parietal microhemorrhages are noted on SWI images which were not noted on previous MRI from April 2022  SIGNIFICANT HOSPITAL EVENTS 7/2- admitted after 3 episodes of arm heaviness  INTERIM HISTORY/SUBJECTIVE Didn't sleep well overnight. Does feel that she has memory issues.  Recommend 30 day heart monitor  EEG ordered - pending   OBJECTIVE  CBC    Component Value Date/Time   WBC 4.5 06/28/2023 0816   RBC 4.43 06/28/2023 0816   HGB 13.6 06/28/2023 0824   HCT 40.0 06/28/2023 0824   PLT 259 06/28/2023 0816   MCV 92.1 06/28/2023 0816   MCH 30.2 06/28/2023 0816   MCHC 32.8 06/28/2023 0816   RDW 12.6 06/28/2023 0816   LYMPHSABS 1.3 06/28/2023 0816   MONOABS 0.4 06/28/2023 0816   EOSABS 0.0 06/28/2023 0816   BASOSABS 0.1 06/28/2023 0816    BMET    Component Value Date/Time   NA 140 06/28/2023 0824   K 3.7 06/28/2023 0824   CL 106 06/28/2023 0824   CO2 22 06/28/2023 0816   GLUCOSE 108 (H) 06/28/2023 0824   BUN 17 06/28/2023 0824   CREATININE 0.80 06/28/2023 0824   CALCIUM 9.3 06/28/2023 0816   GFRNONAA >60 06/28/2023 0816    IMAGING past 24 hours CT ANGIO HEAD NECK W WO CM  Result Date: 06/28/2023 CLINICAL DATA:  Infarcts on same-day MRI, determine embolic source EXAM: CT ANGIOGRAPHY HEAD AND NECK WITH AND WITHOUT CONTRAST TECHNIQUE:  Multidetector CT imaging of the head and neck was performed using the standard protocol during bolus administration of intravenous contrast. Multiplanar CT image reconstructions and MIPs were obtained to evaluate the vascular anatomy. Carotid stenosis measurements (when applicable) are obtained utilizing NASCET criteria, using the distal internal carotid diameter as the denominator. RADIATION DOSE REDUCTION: This exam was performed according to the departmental dose-optimization program which includes automated exposure control, adjustment of the mA and/or kV according to patient size and/or use of iterative reconstruction technique. CONTRAST:  75mL OMNIPAQUE IOHEXOL 350 MG/ML SOLN COMPARISON:  06/28/2023 CT head, 06/28/2023 MRI head FINDINGS: CT HEAD FINDINGS For noncontrast findings, please see same day CT head. CTA NECK FINDINGS Aortic arch: Variant anatomy, with a common origin of the brachiocephalic and left common carotid arteries, and the left vertebral artery originating from the aorta. Imaged portion shows no evidence of aneurysm or dissection. No significant stenosis of the major arch vessel origins. Mild aortic atherosclerosis. Right carotid system: No evidence of stenosis, dissection, or occlusion. Left carotid system: No evidence of stenosis, dissection, or occlusion. Vertebral arteries: Right dominant system. No evidence of stenosis, dissection, or occlusion. Mild Skeleton: No acute osseous abnormality. Degenerative changes in the cervical spine. Other neck: 7 mm hypoenhancing focus in the right thyroid, for which no follow-up is currently indicated. (Reference: J Am Coll Radiol. 2015 Feb;12(2): 143-50) Upper chest:  No focal pulmonary opacity or pleural effusion. Review of the MIP images confirms the above findings CTA HEAD FINDINGS Anterior circulation: Both internal carotid arteries are patent to the termini, without significant stenosis. A1 segments patent, hypoplastic on the right. Normal anterior  communicating artery. Anterior cerebral arteries are patent to their distal aspects without significant stenosis. No M1 stenosis or occlusion. MCA branches perfused to their distal aspects without significant stenosis. Posterior circulation: The left vertebral artery is diminutive and primarily supplies the left PICA, with a very small continuing left V4. The right vertebral artery is patent to the vertebrobasilar junction without significant stenosis. Posterior inferior cerebellar arteries patent proximally. Basilar patent to its distal aspect without significant stenosis. Superior cerebellar arteries patent proximally. Patent P1 segments, hypoplastic on the right. Near fetal origin of the right PCA from the right posterior communicating artery. PCAs perfused to their distal aspects without significant stenosis. The left posterior communicating artery is also patent. Venous sinuses: Well opacified. Patent. Anatomic variants: Near fetal origin of the right PCA. Review of the MIP images confirms the above findings IMPRESSION: 1. No intracranial large vessel occlusion or significant stenosis. 2. No hemodynamically significant stenosis in the neck. 3. Aortic atherosclerosis. Aortic Atherosclerosis (ICD10-I70.0). Electronically Signed   By: Wiliam Ke M.D.   On: 06/28/2023 14:48   ECHOCARDIOGRAM COMPLETE  Result Date: 06/28/2023    ECHOCARDIOGRAM REPORT   Patient Name:   Chelsea Barnett Date of Exam: 06/28/2023 Medical Rec #:  [id number]           Height:       63.5 in Accession #:    [id number]          Weight:       126.0 lb Date of Birth:  1946/08/21           BSA:          1.598 m Patient Age:    77 years            BP:           168/77 mmHg Patient Gender: F                   HR:           68 bpm. Exam Location:  Inpatient Procedure: 2D Echo, Color Doppler and Cardiac Doppler Indications:    TIA  History:        Patient has prior history of Echocardiogram examinations, most                 recent 08/11/2020.  TIA, Signs/Symptoms:Edema and Chest Pain; Risk                 Factors:Dyslipidemia.  Sonographer:    Milbert Coulter Referring Phys: [id number] RONDELL A SMITH IMPRESSIONS  1. Left ventricular ejection fraction, by estimation, is 60 to 65%. The left ventricle has normal function. The left ventricle has no regional wall motion abnormalities. Left ventricular diastolic parameters are consistent with Grade I diastolic dysfunction (impaired relaxation).  2. Right ventricular systolic function is normal. The right ventricular size is normal.  3. The mitral valve is abnormal. Trivial mitral valve regurgitation. No evidence of mitral stenosis.  4. The aortic valve is tricuspid. There is mild calcification of the aortic valve. There is mild thickening of the aortic valve. Aortic valve regurgitation is mild. Aortic valve sclerosis is present, with no evidence of aortic valve stenosis.  5. The inferior vena cava is normal in size  with greater than 50% respiratory variability, suggesting right atrial pressure of 3 mmHg. FINDINGS  Left Ventricle: Left ventricular ejection fraction, by estimation, is 60 to 65%. The left ventricle has normal function. The left ventricle has no regional wall motion abnormalities. The left ventricular internal cavity size was normal in size. There is  no left ventricular hypertrophy. Left ventricular diastolic parameters are consistent with Grade I diastolic dysfunction (impaired relaxation). Right Ventricle: The right ventricular size is normal. No increase in right ventricular wall thickness. Right ventricular systolic function is normal. Left Atrium: Left atrial size was normal in size. Right Atrium: Right atrial size was normal in size. Pericardium: There is no evidence of pericardial effusion. Mitral Valve: The mitral valve is abnormal. There is mild thickening of the mitral valve leaflet(s). There is mild calcification of the mitral valve leaflet(s). Trivial mitral valve regurgitation. No evidence  of mitral valve stenosis. Tricuspid Valve: The tricuspid valve is normal in structure. Tricuspid valve regurgitation is trivial. No evidence of tricuspid stenosis. Aortic Valve: The aortic valve is tricuspid. There is mild calcification of the aortic valve. There is mild thickening of the aortic valve. Aortic valve regurgitation is mild. Aortic valve sclerosis is present, with no evidence of aortic valve stenosis. Aortic valve mean gradient measures 4.0 mmHg. Aortic valve peak gradient measures 7.2 mmHg. Pulmonic Valve: The pulmonic valve was normal in structure. Pulmonic valve regurgitation is not visualized. No evidence of pulmonic stenosis. Aorta: The aortic root is normal in size and structure. Venous: The inferior vena cava is normal in size with greater than 50% respiratory variability, suggesting right atrial pressure of 3 mmHg. IAS/Shunts: No atrial level shunt detected by color flow Doppler.  LEFT VENTRICLE PLAX 2D LVIDd:         4.30 cm Diastology LVIDs:         3.00 cm LV e' medial:    5.66 cm/s LV PW:         1.00 cm LV E/e' medial:  8.5 LV IVS:        1.00 cm LV e' lateral:   7.72 cm/s                        LV E/e' lateral: 6.2  RIGHT VENTRICLE RV Basal diam:  3.60 cm RV Mid diam:    2.80 cm RV S prime:     17.20 cm/s TAPSE (M-mode): 3.0 cm LEFT ATRIUM             Index        RIGHT ATRIUM           Index LA diam:        3.20 cm 2.00 cm/m   RA Area:     16.50 cm LA Vol (A2C):   48.8 ml 30.53 ml/m  RA Volume:   42.80 ml  26.78 ml/m LA Vol (A4C):   48.2 ml 30.16 ml/m LA Biplane Vol: 51.4 ml 32.16 ml/m  AORTIC VALVE AV Vmax:           134.00 cm/s AV Vmean:          89.900 cm/s AV VTI:            0.277 m AV Peak Grad:      7.2 mmHg AV Mean Grad:      4.0 mmHg LVOT Vmax:         123.00 cm/s LVOT Vmean:        82.900 cm/s LVOT  VTI:          0.259 m LVOT/AV VTI ratio: 0.94  AORTA Ao Root diam: 3.00 cm MITRAL VALVE MV Area (PHT): 2.82 cm    SHUNTS MV Decel Time: 269 msec    Systemic VTI: 0.26 m MV E  velocity: 48.00 cm/s MV A velocity: 82.30 cm/s MV E/A ratio:  0.58 Charlton Haws MD Electronically signed by Charlton Haws MD Signature Date/Time: 06/28/2023/12:45:41 PM    Final    MR Brain Wo Contrast (neuro protocol)  Addendum Date: 06/28/2023   ADDENDUM REPORT: 06/28/2023 11:27 ADDENDUM: These results were called by telephone at the time of interpretation on 06/28/2023 at 11:23 am to provider Gerhard Munch , who verbally acknowledged these results. Electronically Signed   By: Jackey Loge D.O.   On: 06/28/2023 11:27   Result Date: 06/28/2023 CLINICAL DATA:  Provided history: Transient ischemic attack (TIA). Right upper extremity weakness, intermittent. EXAM: MRI HEAD WITHOUT CONTRAST TECHNIQUE: Multiplanar, multiecho pulse sequences of the brain and surrounding structures were obtained without intravenous contrast. COMPARISON:  Head CT 06/28/2023. FINDINGS: Brain: No age advanced or lobar predominant parenchymal atrophy. 3 mm acute cortical infarct within the mid left frontal lobe (left MCA vascular territory). An additional 4 mm acute cortical infarct is questioned within the posterior left frontal lobe (MCA vascular territory) (series 2, image 33). Small-volume acute subarachnoid hemorrhage scattered along the mid-to-posterior left frontal lobe and left parietal lobe. In retrospect, this was present on the head CT performed earlier today, but is seen to much better advantage on the current exam. Multifocal T2 FLAIR hyperintense signal abnormality within the cerebral white matter, nonspecific but compatible with mild chronic small vessel ischemic disease. There are a few nonspecific punctate chronic microhemorrhages scattered within the supratentorial brain. Partially empty sella turcica. No evidence of an intracranial mass. No midline shift or hydrocephalus. Vascular: Maintained flow voids within the proximal large arterial vessels. Dominant right vertebral artery. Skull and upper cervical spine: No focal  suspicious marrow lesion. Sinuses/Orbits: No mass or acute finding within the imaged orbits. No significant paranasal sinus disease. IMPRESSION: 1. Small-volume acute subarachnoid hemorrhage along the left frontal and parietal lobes. 2. 3 mm acute cortical infarct within the mid left frontal lobe (left MCA territory). 3. 4 mm acute cortical infarct questioned within the posterior left frontal lobe (versus artifact from adjacent subarachnoid hemorrhage). 4. Mild chronic small ischemic changes within the cerebral white matter. 5. Few nonspecific punctate chronic microhemorrhages scattered within the supratentorial brain. Electronically Signed: By: Jackey Loge D.O. On: 06/28/2023 11:01   CT HEAD WO CONTRAST  Result Date: 06/28/2023 CLINICAL DATA:  Right-sided weakness and right-sided drift, numbness around the mouth. Symptoms now resolved. EXAM: CT HEAD WITHOUT CONTRAST TECHNIQUE: Contiguous axial images were obtained from the base of the skull through the vertex without intravenous contrast. RADIATION DOSE REDUCTION: This exam was performed according to the departmental dose-optimization program which includes automated exposure control, adjustment of the mA and/or kV according to patient size and/or use of iterative reconstruction technique. COMPARISON:  Brain MRI 03/29/2021 FINDINGS: Brain: There is no acute intracranial hemorrhage, extra-axial fluid collection, or acute infarct. Parenchymal volume is normal for age. The ventricles are normal in size. Gray-white differentiation is preserved. Patchy hypodensity in the supratentorial white matter likely reflects sequela of underlying chronic small-vessel ischemic change. The pituitary and suprasellar region are normal. There is no mass lesion. There is no mass effect or midline shift. Vascular: No hyperdense vessel or unexpected calcification. Skull: Normal. Negative  for fracture or focal lesion. Sinuses/Orbits: The imaged paranasal sinuses are clear. The globes  and orbits are unremarkable. Other: The mastoid air cells and middle ear cavities are clear. IMPRESSION: No acute intracranial pathology. Electronically Signed   By: Lesia Hausen M.D.   On: 06/28/2023 08:56    Vitals:   06/28/23 1525 06/28/23 1700 06/28/23 1949 06/29/23 0007  BP: (!) 172/74 131/72 136/70 120/71  Pulse: 76     Resp: 16 17 16 17   Temp:   98 F (36.7 C) 98.2 F (36.8 C)  TempSrc:   Oral Oral  SpO2: 95%  98%   Weight:      Height:         PHYSICAL EXAM General:  Alert, well-nourished, well-developed pleasant Caucasian lady  in no acute distress Psych:  Mood and affect appropriate for situation CV: Regular rate and rhythm on monitor Respiratory:  Regular, unlabored respirations on room air GI: Abdomen soft and nontender   NEURO:  Mental Status: AA&Ox3, patient is able to give clear and coherent history Speech/Language: speech is without dysarthria or aphasia.  Naming, repetition, fluency, and comprehension intact. Adds change appropriate, able to subtract by 7s from 100 Able to name 15 animals which can walk on 4 legs Diminished recall 2/3 words.  Clock drawing 4/4  Cranial Nerves:  II: PERRL. Visual fields full.  III, IV, VI: EOMI. Eyelids elevate symmetrically.  V: Sensation is intact to light touch and symmetrical to face.  VII: Face is symmetrical resting and smiling VIII: hearing intact to voice. IX, X: Palate elevates symmetrically. Phonation is normal.  ZO:[id number] shrug 5/5. XII: tongue is midline without fasciculations. Motor: 5/5 strength to all muscle groups tested.  Tone: is normal and bulk is normal Sensation- Intact to light touch bilaterally. Extinction absent to light touch to DSS.   Coordination: FTN intact bilaterally, HKS: no ataxia in BLE.No drift.  Gait- deferred   ASSESSMENT/PLAN  Acute Ischemic Infarct and Subarachnoid Hemorrhage :   SAH along L frontal and parietal lobes and 3mm acute cortical infarct in L MCA terrtiroy  Etiology:   cryptogenic versus possible CAA given recurrent transient nature of symptoms lasting only a few minutes suggesting amyloid spells and neuroimaging findings appear out of proportion to patient's transient symptoms Code Stroke CT head No acute abnormality.  CTA head & neck no hemodynamically significant stenoses  MRI  small volume acute SAH along L frontal and parietal lobes and 3mm acute cortical infarct in L MCA territory  2D Echo EF is 60-65%  LDL 130 HgbA1c 5.4 EEG- This study is within normal limits. No seizures or epileptiform discharges were seen throughout the recording. A normal interictal EEG does not exclude the diagnosis of epilepsy. VTE prophylaxis - SCDs No antithrombotic prior to admission, now on aspirin 81 mg daily alone given subarachnoid hemorrhage and possibility of cerebral amyloid angiopathy Therapy recommendations:  No follow up needed Disposition:  Home  Hypertension Home meds:  Losartan Resumed  Stable Blood Pressure Goal: SBP less than 160   Hyperlipidemia LDL 130, goal < 70 Add rosuvastatin 10mg   Continue statin at discharge  Diabetes type II Controlled HgbA1c 5.4, goal < 7.0 CBGs SSI Recommend close follow-up with PCP for better DM control  Dysphagia Patient has post-stroke dysphagia, SLP consulted    Diet   Diet Heart Room service appropriate? Yes; Fluid consistency: Thin   Advance diet as tolerated  Other Active Problems   Hospital day # 0  Patient seen and examined by  NP/APP with MD. MD to update note as needed.   Elmer Picker, DNP, FNP-BC [facility] Pager: [phone]  STROKE MD NOTE :  I have personally obtained history,examined this patient, reviewed notes, independently viewed imaging studies, participated in medical decision making and plan of care.ROS completed by me personally and pertinent positives fully documented  I have made any additions or clarifications directly to the above note. Agree with note above.   Patient presented with 2 transient episodes of right upper extremity heaviness lasting only 1 to 2 minutes each and MRI scan shows tiny punctate left frontal MCA cortical infarcts along with left parietal sulcal subarachnoid hemorrhage with a few microhemorrhages noted on SWI images which were not noted on previous MRI from April 2022 raising concern for cerebral amyloid angiopathy and her clinical presentation being suggestive of amyloid spells.  Patient does need prolonged cardiac monitoring to look for paroxysmal A-fib but is not a good long-term anticoagulation candidate due to increased bleeding risk.  Recommend aspirin 81 mg alone and not do dual antiplatelet therapy due to subarachnoid hemorrhage and bleeding risk.  Aggressive risk factor modification.  Long discussion with patient and husband and answered questions.  Follow-up as an outpatient in stroke clinic with me in 2 months or call earlier if necessary.  Discussed with Dr. Thedore Mins.  Greater than 50% time during this 50-minute visit was spent in counseling and coordination of care about her amyloid spells and TIA like presentation and discussion about abnormal neuroimaging results and need for outpatient follow-up and answering questions.  Delia Heady, MD Medical Director [facility] Pager: [phone] 06/29/2023 3:49 PM   To contact Stroke Continuity provider, please refer to [email or web address]. After hours, contact General Neurology

## 2023-06-29 NOTE — TOC CM/SW Note (Signed)
Transition of Care [facility]) - Inpatient Brief Assessment   Patient Details  Name: Chelsea Barnett MRN: [id number] Date of Birth: [birth year]  Transition of Care [facility]) CM/SW Contact:    Gordy Clement, RN Phone Number: 06/29/2023, 11:13 AM   Clinical Narrative:  CM met with patient and Husband at bedside.  Patient is from home and completely independent at baseloine.  Patient is waiting for an EEG and then will dc to home with husband .  PCP established and patient is insured.  No TOC needs identified.    Transition of Care Asessment: Insurance and Status: Insurance coverage has been reviewed (Medicare A and B) Patient has primary care physician: Yes Clelia Croft, Netta Corrigan., MD) Home environment has been reviewed: Lives with Husband Prior level of function:: Completely independent Prior/Current Home Services: No current home services Social Determinants of Health Reivew: SDOH reviewed no interventions necessary Readmission risk has been reviewed: Yes (N/A  none listed) Transition of care needs: no transition of care needs at this time

## 2023-06-29 NOTE — Procedures (Signed)
Patient Name: GRETELL GELIN  MRN: [id number]  Epilepsy Attending: Charlsie Quest  Referring Physician/Provider: Elmer Picker, NP  Date: 06/29/2023 Duration: 23.11 mins  Patient history: 77 y.o. female presenting with 2 episodes of R arm weakness now resolved and found to have v small acute cortical infarct L frontal lobe and small volume SAH along L frontal and parietal lobes. EEG to evaluate for seizure.  Level of alertness: Awake  AEDs during EEG study: None  Technical aspects: This EEG study was done with scalp electrodes positioned according to the 10-20 International system of electrode placement. Electrical activity was reviewed with band pass filter of 1-70Hz , sensitivity of 7 uV/mm, display speed of 46mm/sec with a 60Hz  notched filter applied as appropriate. EEG data were recorded continuously and digitally stored.  Video monitoring was available and reviewed as appropriate.  Description: The posterior dominant rhythm consists of 8-9 Hz activity of moderate voltage (25-35 uV) seen predominantly in posterior head regions, symmetric and reactive to eye opening and eye closing. Physiologic photic driving was seen during photic stimulation.  Hyperventilation was not performed.     IMPRESSION: This study is within normal limits. No seizures or epileptiform discharges were seen throughout the recording.  A normal interictal EEG does not exclude the diagnosis of epilepsy.  Purcell Jungbluth Annabelle Harman

## 2023-07-03 IMAGING — CR DG CHEST 2V
2 series · 2 of 2 positions shown · non-contrast
Comparison: 11/20/2013

CLINICAL DATA: Chest pain

EXAM:
CHEST - 2 VIEW

[chest pa]
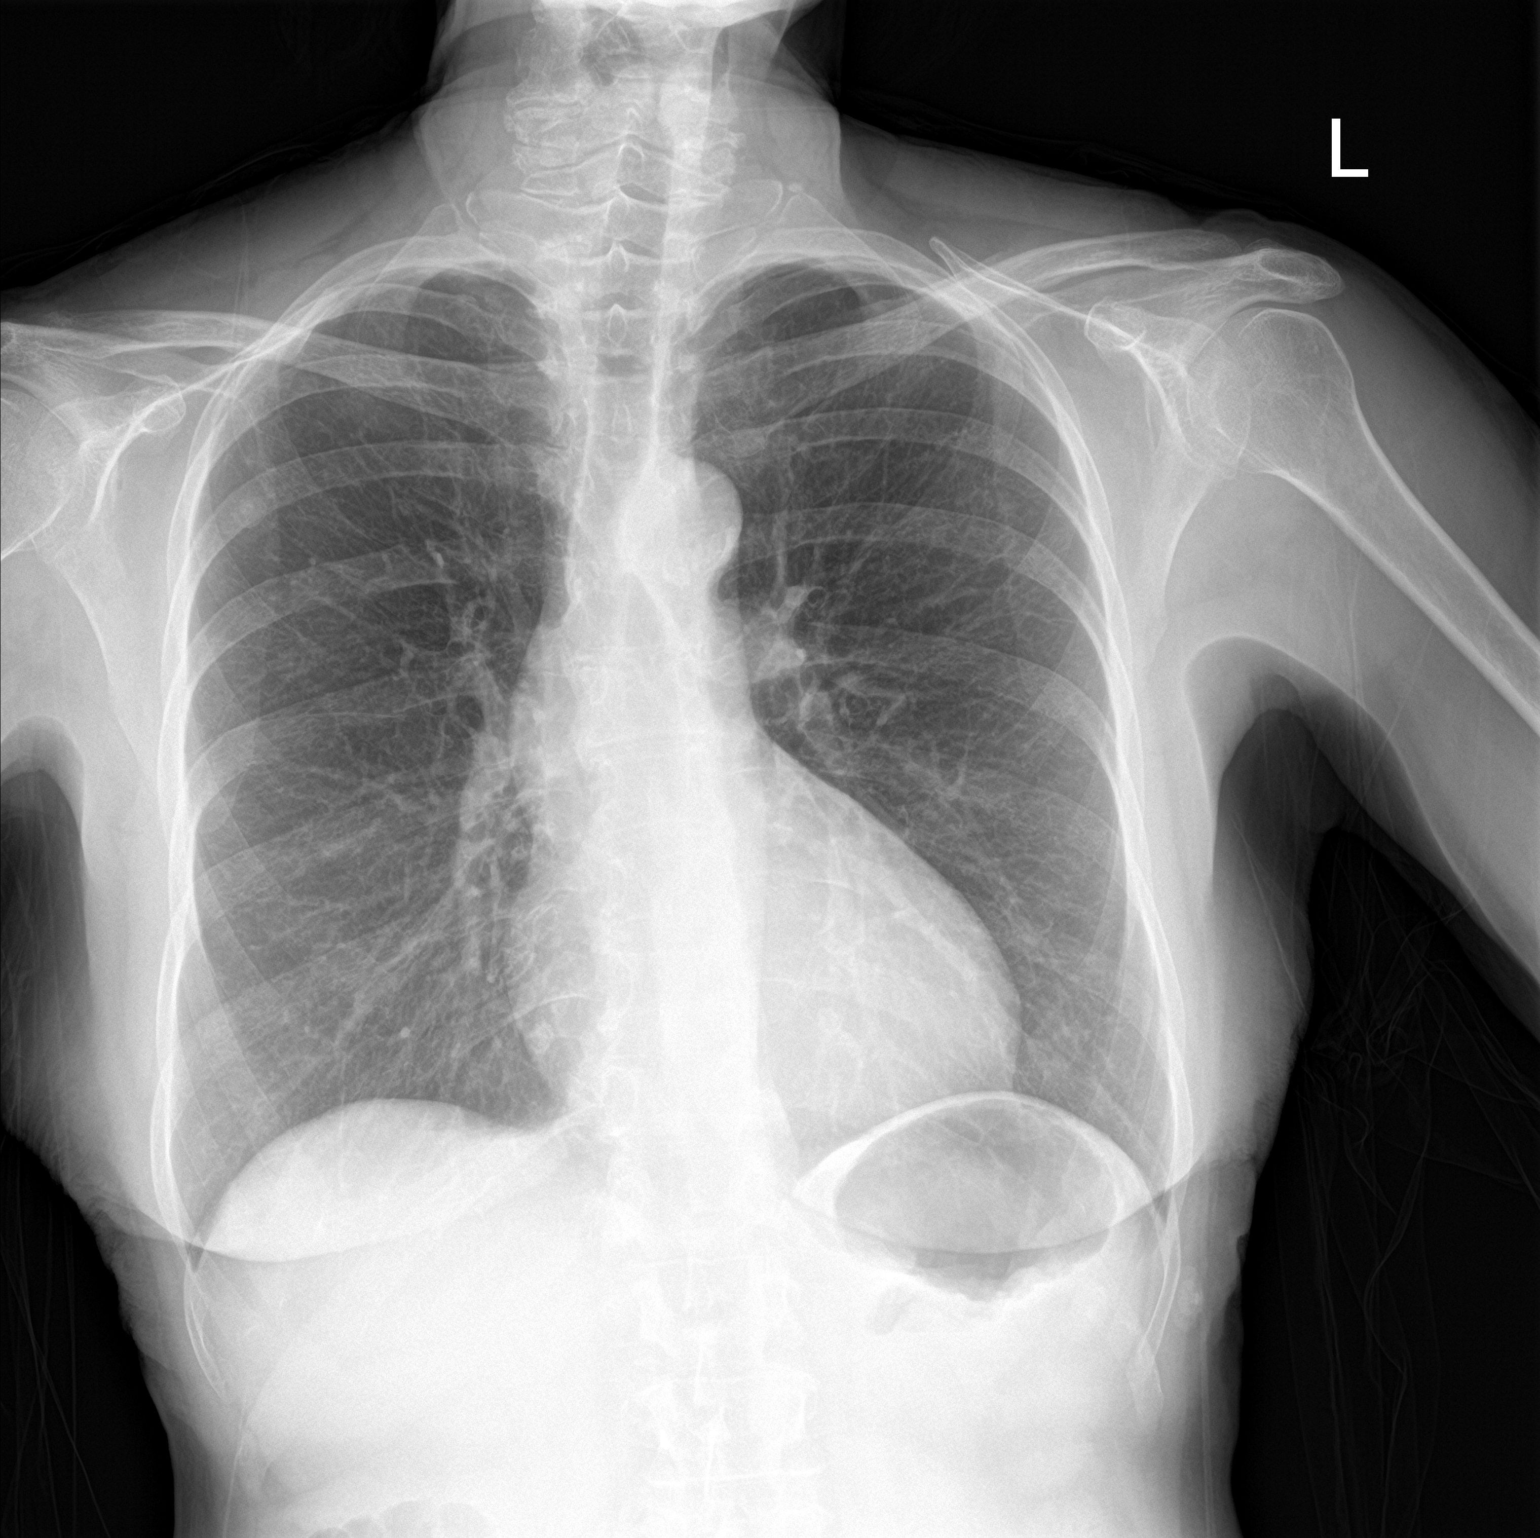

[chest lat]
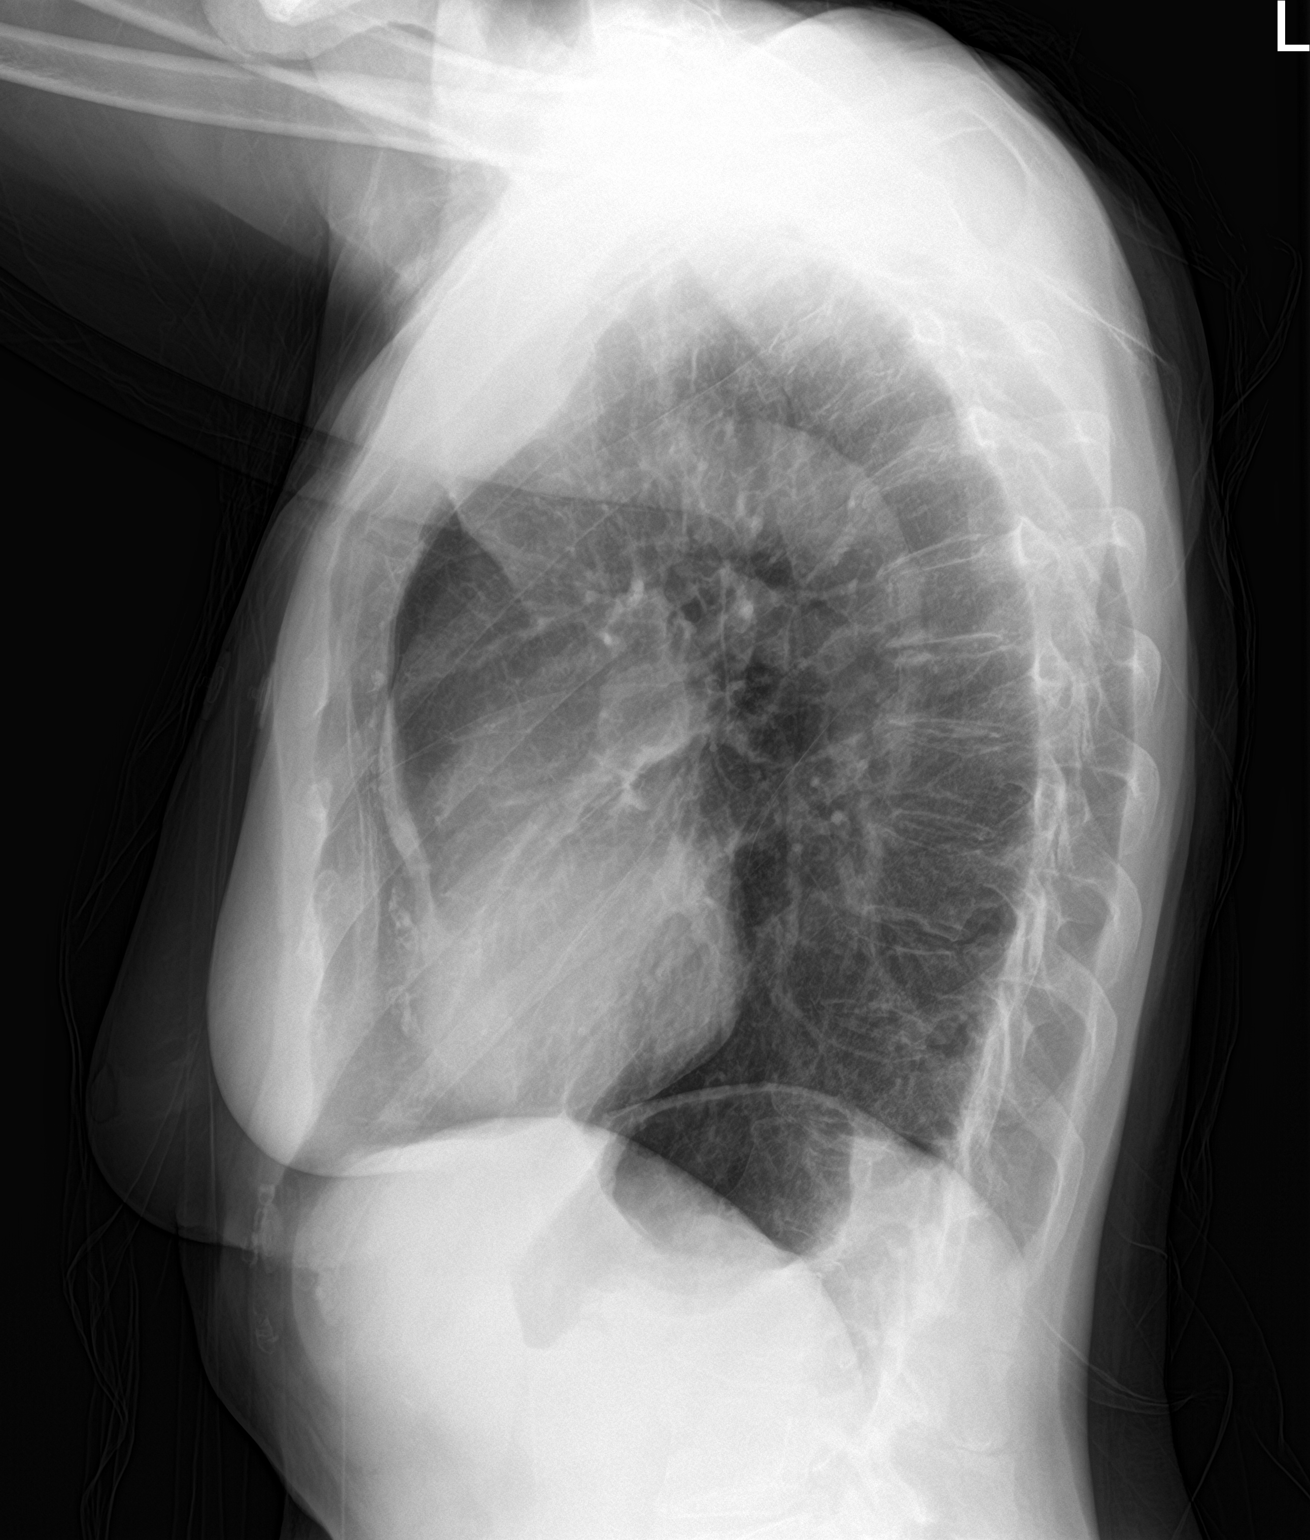

[2 of 2 positions shown; findings below may reference images not displayed]

FINDINGS: The heart size and mediastinal contours are within normal limits.
Both lungs are clear. The visualized skeletal structures are
unremarkable.
IMPRESSION: No active cardiopulmonary disease.

## 2023-07-07 ENCOUNTER — Ambulatory Visit (HOSPITAL_COMMUNITY)
Admission: RE | Admit: 2023-07-07 | Discharge: 2023-07-07 | Disposition: A | Discharge status: Home / Self Care | Payer: Medicare Other | Source: Ambulatory Visit | Attending: Cardiology | Admitting: Cardiology

## 2023-07-07 ENCOUNTER — Inpatient Hospital Stay (HOSPITAL_COMMUNITY)
Admission: RE | Admit: 2023-07-07 | Discharge: 2023-07-07 | Disposition: A | Discharge status: Home / Self Care | Payer: Medicare Other | Source: Ambulatory Visit | Attending: Cardiology | Admitting: Cardiology

## 2023-07-07 DIAGNOSIS — I639 Cerebral infarction, unspecified: Secondary | ICD-10-CM

## 2023-07-07 DIAGNOSIS — Z8673 Personal history of transient ischemic attack (TIA), and cerebral infarction without residual deficits: Secondary | ICD-10-CM | POA: Diagnosis not present

## 2023-07-07 DIAGNOSIS — I10 Essential (primary) hypertension: Secondary | ICD-10-CM | POA: Diagnosis not present

## 2023-07-07 DIAGNOSIS — I700 Atherosclerosis of aorta: Secondary | ICD-10-CM | POA: Diagnosis not present

## 2023-07-07 DIAGNOSIS — I4891 Unspecified atrial fibrillation: Secondary | ICD-10-CM

## 2023-07-07 DIAGNOSIS — I609 Nontraumatic subarachnoid hemorrhage, unspecified: Secondary | ICD-10-CM | POA: Diagnosis not present

## 2023-07-07 DIAGNOSIS — E785 Hyperlipidemia, unspecified: Secondary | ICD-10-CM | POA: Diagnosis not present

## 2023-07-18 ENCOUNTER — Ambulatory Visit (HOSPITAL_COMMUNITY)
Admit: 2023-07-18 | Discharge: 2023-07-18 | Disposition: A | Discharge status: Home / Self Care | Payer: Medicare Other | Attending: Cardiology | Admitting: Cardiology

## 2023-07-18 ENCOUNTER — Encounter (HOSPITAL_COMMUNITY): Payer: Self-pay | Admitting: Cardiology

## 2023-07-18 ENCOUNTER — Inpatient Hospital Stay (HOSPITAL_COMMUNITY)
Admission: RE | Admit: 2023-07-18 | Discharge: 2023-07-18 | Disposition: A | Discharge status: Home / Self Care | Payer: Medicare Other | Source: Ambulatory Visit | Attending: Cardiology | Admitting: Cardiology

## 2023-07-18 VITALS — BP 120/80 | HR 77 | Wt 127.0 lb

## 2023-07-18 DIAGNOSIS — I10 Essential (primary) hypertension: Secondary | ICD-10-CM | POA: Insufficient documentation

## 2023-07-18 DIAGNOSIS — E785 Hyperlipidemia, unspecified: Secondary | ICD-10-CM | POA: Insufficient documentation

## 2023-07-18 DIAGNOSIS — R011 Cardiac murmur, unspecified: Secondary | ICD-10-CM

## 2023-07-18 DIAGNOSIS — I639 Cerebral infarction, unspecified: Secondary | ICD-10-CM

## 2023-07-18 DIAGNOSIS — Z79899 Other long term (current) drug therapy: Secondary | ICD-10-CM | POA: Diagnosis not present

## 2023-07-18 DIAGNOSIS — Z8673 Personal history of transient ischemic attack (TIA), and cerebral infarction without residual deficits: Secondary | ICD-10-CM | POA: Diagnosis not present

## 2023-07-18 DIAGNOSIS — I4891 Unspecified atrial fibrillation: Secondary | ICD-10-CM | POA: Diagnosis not present

## 2023-07-18 DIAGNOSIS — I351 Nonrheumatic aortic (valve) insufficiency: Secondary | ICD-10-CM | POA: Insufficient documentation

## 2023-07-18 DIAGNOSIS — Q2112 Patent foramen ovale: Secondary | ICD-10-CM | POA: Diagnosis not present

## 2023-07-18 LAB — BASIC METABOLIC PANEL
Anion gap: 9 (ref 5–15)
BUN: 13 mg/dL (ref 8–23)
CO2: 27 mmol/L (ref 22–32)
Calcium: 9.9 mg/dL (ref 8.9–10.3)
Chloride: 104 mmol/L (ref 98–111)
Creatinine, Ser: 0.84 mg/dL (ref 0.44–1.00)
GFR, Estimated: >60 mL/min (ref >60)
Glucose, Bld: 97 mg/dL (ref 70–99)
Potassium: 3.9 mmol/L (ref 3.5–5.1)
Sodium: 140 mmol/L (ref 135–145)

## 2023-07-18 MED ORDER — LOSARTAN POTASSIUM 25 MG PO TABS: ORAL_TABLET | ORAL | 5 refills | Status: DC

## 2023-07-18 NOTE — Patient Instructions (Signed)
Medication Changes:  PLEASE TAKE LOSARTAN 50MG  IN THE MORNING AND THEN 25MG  IN THE EVENING  Lab Work:  Labs done today, your results will be available in MyChart, we will contact you for abnormal readings.  Testing/Procedures:  Your physician has requested that you have an LIMITED echocardiogram. Echocardiography is a painless test that uses sound waves to create images of your heart. It provides your doctor with information about the size and shape of your heart and how well your heart's chambers and valves are working. You may receive an ultrasound enhancing agent through an IV if needed to better visualize your heart during the echo.This procedure takes approximately one hour. There are no restrictions for this procedure.   Your provider has recommended that  you wear a Zio Patch for 14 days.  This monitor will record your heart rhythm for our review.  IF you have any symptoms while wearing the monitor please press the button.  If you have any issues with the patch or you notice a red or orange light on it please call the company at [phone].  Once you remove the patch please mail it back to the company as soon as possible so we can get the results.  Follow-Up in: 3 WEEKS WITH PHARMD   AND THEN 2 MONTHS WITH DR. Shirlee Latch   At the [facility], you and your health needs are our priority. We have a designated team specialized in the treatment of Heart Failure. This Care Team includes your primary Heart Failure Specialized Cardiologist (physician), Advanced Practice Providers (APPs- Physician Assistants and Nurse Practitioners), and Pharmacist who all work together to provide you with the care you need, when you need it.   You may see any of the following providers on your designated Care Team at your next follow up:  Dr. Arvilla Meres Dr. Marca Ancona Dr. Marcos Eke, NP Robbie Lis, [place] [facility] [place], [place] Brynda Peon,  NP Karle Plumber, PharmD   Please be sure to bring in all your medications bottles to every appointment.   Need to Contact us:  If you have any questions or concerns before your next appointment please send [place] a message through [place] or call our office at [phone].    TO LEAVE A MESSAGE FOR THE NURSE SELECT OPTION 2, PLEASE LEAVE A MESSAGE INCLUDING: YOUR NAME DATE OF BIRTH CALL BACK NUMBER REASON FOR CALL**this is important as we prioritize the call backs  YOU WILL RECEIVE A CALL BACK THE SAME DAY AS LONG AS YOU CALL BEFORE 4:00 PM

## 2023-07-18 NOTE — Progress Notes (Signed)
PCP: Dr. Clelia Croft Cardiology: Dr. Shirlee Latch  77 y.o. with history of HTN, aortic insufficiency and prior melanoma.  She had been doing well until 7/24 when she developed right arm weakness and was found to have acute CVA.  MRI brain showed small acute cortical infarcts x 2 in the left frontal lobe as well as small acute subarachnoid hemorrage.  CTA head/neck with no significant carotid disease and no large vessel occlusion in brain. Echo showed EF 60-65%, RV normal, AI mild.  Atrial fibrillation was not seen in the hospital.   She returns for post-hospital followup.  She is wearing a Zio monitor.  She also has been checking her heart rhythm on an Apple Watch.  BP good today, however, SBP still elevated at times in 130s-140s at home.  Her right arm strength seems completely normal to her at this point.  No exertional dyspnea, no chest pain.  No further neurologic symptoms.    ECG: NSR with PVCs (personally reviewed)  Labs (5/16): K 4, creatinine 0.61 Labs (7/21): LDL 123, HDL 85, K 4.1, creatinine 0.8, TSH normal, hgb 13 Labs (7/24): LDL 130, HDL 101, K 3.7, creatinine 0.77, hgb 13.4  PMH: 1. Venous insufficiency. 2. HTN 3. H/o melanoma. 4. Migraines 5. Psoriasis 6. Aortic insufficiency: Echo (4/16) with EF 55-60%, mild to moderate AI, trileaflet aortic valve.  - Echo (9/17): EF 60-65%, mild-moderate AI, mild MR.  - Echo (7/24): EF 60-65%, RV normal, AI mild 7. CVA: 7/24 CVA, brain MRI showed small acute cortical infarcts x 2 in the left frontal lobe as well as small acute subarachnoid hemorrage.  CTA head/neck with no significant carotid disease and no large vessel occlusion in brain.   SH: Married, 5 children, nonsmoker, lives in [place].  FH: Mother, father and sister with atrial fibrillation; brother with CAD.  ROS: All systems reviewed and negative except as per HPI.   Current Outpatient Medications  Medication Sig Dispense Refill   acetaminophen (TYLENOL) 325 MG tablet Take 650 mg by  mouth as needed for headache.     Apremilast (OTEZLA) 30 MG TABS Take 30 mg by mouth 2 (two) times daily.     aspirin 81 MG chewable tablet Chew 1 tablet (81 mg total) by mouth daily. 90 tablet 0   clobetasol cream (TEMOVATE) 0.05 % Apply 1 Application topically as needed (psoriasis).     estradiol (ESTRACE) 0.1 MG/GM vaginal cream Place 1 Applicatorful vaginally as needed (dryness).     Fluocinolone Acetonide Body 0.01 % OIL Apply 1 Application topically as needed (psoriasis). To scalp     Multiple Vitamins-Minerals (MULTIVITAMIN WITH MINERALS) tablet Take 1 tablet by mouth daily.     mupirocin ointment (BACTROBAN) 2 % Apply 1 Application topically as needed (cuts).     rosuvastatin (CRESTOR) 10 MG tablet Take 1 tablet (10 mg total) by mouth daily. 30 tablet 0   triamcinolone cream (KENALOG) 0.1 % Apply 1 Application topically as needed (psoriasis).     Vitamin D, Ergocalciferol, (DRISDOL) 50000 units CAPS capsule Take 50,000 Units by mouth every 7 (seven) days.     losartan (COZAAR) 25 MG tablet Please take 50mg  in the  morning, and then 25mg  in the evening 90 tablet 5   No current facility-administered medications for this encounter.   BP 120/80   Pulse 77   Wt 57.6 kg (127 lb)   LMP 12/28/1999   SpO2 97%   BMI 22.14 kg/m  General: NAD Neck: No JVD, no thyromegaly or thyroid nodule.  Lungs: Clear to auscultation bilaterally with normal respiratory effort. CV: Nondisplaced PMI.  Heart regular S1/S2, no S3/S4, no murmur.  No peripheral edema.  No carotid bruit.  Normal pedal pulses.  Abdomen: Soft, nontender, no hepatosplenomegaly, no distention.  Skin: Intact without lesions or rashes.  Neurologic: Alert and oriented x 3.  Psych: Normal affect. Extremities: No clubbing or cyanosis.  HEENT: Normal.   Assessment/Plan: 1. HTN: BP still mildly elevated at times.   - Increase losartan to 50 qam/25 qpm (taking 25 bid).  BMET today and in 10 days.  - She will continue to check BP daily  and record readings, followup with my pharmacist in 3 wks for adjustment of BP meds as needed.  2. Aortic insufficiency: Mild on 7/24 echo.  3. Hyperlipidemia: Now on Crestor 10 mg daily, goal LDL < 55 with CVA.  Check lipids/LFTs in 2 months.  4. Family history of atrial fibrillation: Strong FH of atrial fibrillation and now has had CVA.  Atrial fibrillation has not been detected.  - She will wear Zio monitor x 1 month.  - She also has an Scientist, physiological and will use this to screen for atrial fibrillation long-term. Can also consider LINQ monitor. 5. CVA:  7/24 CVA, brain MRI showed small acute cortical infarcts x 2 in the left frontal lobe as well as small acute subarachnoid hemorrage.  CTA head/neck with no significant carotid disease and no large vessel occlusion in brain.  No atrial fibrillation has been detected yet.  - Continue monitoring for atrial fibrillation as above.  - Limited echo for bubble study (PFO).  - Continue ASA 81 for now.  - Continue statin.  - BP control.  - Has followup with neurology.                     She will followup with HF pharmacist for BP med management in 3 wks, see me in 2 months  Marca Ancona 07/18/2023

## 2023-07-21 ENCOUNTER — Other Ambulatory Visit (HOSPITAL_COMMUNITY): Payer: Self-pay

## 2023-07-21 ENCOUNTER — Other Ambulatory Visit (HOSPITAL_COMMUNITY): Payer: Self-pay | Admitting: Cardiology

## 2023-07-21 ENCOUNTER — Encounter (HOSPITAL_COMMUNITY): Payer: Medicare Other

## 2023-07-21 MED ORDER — LOSARTAN POTASSIUM 50 MG PO TABS: ORAL_TABLET | ORAL | 11 refills | Status: DC

## 2023-07-21 NOTE — Telephone Encounter (Signed)
Insurance will not pay for 25 mg tabs @ 50 mg in the Am and 25 in the PM  Script updated to 50 mg tab in the AM and 1/2 tab (25 mg) in the PM

## 2023-07-22 ENCOUNTER — Telehealth (HOSPITAL_COMMUNITY): Payer: Self-pay | Admitting: Cardiology

## 2023-07-22 NOTE — Telephone Encounter (Signed)
Patient called to report she has not received Zio kit. Advised order was completed for device to be mailed to her home. If kit not received by Monday 07/25/23 can call office for nurse visit to have one placed

## 2023-07-24 DIAGNOSIS — R011 Cardiac murmur, unspecified: Secondary | ICD-10-CM | POA: Diagnosis not present

## 2023-07-24 DIAGNOSIS — I639 Cerebral infarction, unspecified: Secondary | ICD-10-CM | POA: Diagnosis not present

## 2023-07-28 ENCOUNTER — Ambulatory Visit (HOSPITAL_BASED_OUTPATIENT_CLINIC_OR_DEPARTMENT_OTHER)
Admission: RE | Admit: 2023-07-28 | Discharge: 2023-07-28 | Disposition: A | Discharge status: Home / Self Care | Payer: Medicare Other | Source: Ambulatory Visit

## 2023-07-28 ENCOUNTER — Ambulatory Visit (HOSPITAL_COMMUNITY)
Admission: RE | Admit: 2023-07-28 | Discharge: 2023-07-28 | Disposition: A | Discharge status: Home / Self Care | Payer: Medicare Other | Source: Ambulatory Visit | Attending: Cardiology | Admitting: Cardiology

## 2023-07-28 DIAGNOSIS — I4891 Unspecified atrial fibrillation: Secondary | ICD-10-CM | POA: Diagnosis not present

## 2023-07-28 DIAGNOSIS — R011 Cardiac murmur, unspecified: Secondary | ICD-10-CM | POA: Insufficient documentation

## 2023-07-28 DIAGNOSIS — I639 Cerebral infarction, unspecified: Secondary | ICD-10-CM

## 2023-07-28 DIAGNOSIS — I6389 Other cerebral infarction: Secondary | ICD-10-CM

## 2023-07-28 LAB — ECHOCARDIOGRAM LIMITED BUBBLE STUDY
P 1/2 time: 355 msec
S' Lateral: 3.4 cm

## 2023-07-28 LAB — BASIC METABOLIC PANEL
Anion gap: 9 (ref 5–15)
BUN: 12 mg/dL (ref 8–23)
CO2: 27 mmol/L (ref 22–32)
Calcium: 9.6 mg/dL (ref 8.9–10.3)
Chloride: 100 mmol/L (ref 98–111)
Creatinine, Ser: 0.77 mg/dL (ref 0.44–1.00)
GFR, Estimated: >60 mL/min (ref >60)
Glucose, Bld: 100 mg/dL — ABNORMAL HIGH (ref 70–99)
Potassium: 4.4 mmol/L (ref 3.5–5.1)
Sodium: 136 mmol/L (ref 135–145)

## 2023-08-01 ENCOUNTER — Telehealth (HOSPITAL_COMMUNITY): Payer: Self-pay

## 2023-08-01 MED ORDER — METOPROLOL SUCCINATE ER 25 MG PO TB24: 12.5000 mg | ORAL_TABLET | Freq: Every day | ORAL | 3 refills | Status: DC

## 2023-08-01 NOTE — Telephone Encounter (Signed)
Patient advised and verbalized understanding. New Rx sent into patients pharmacy. Patient currently has on a live zio monitor.   Meds ordered this encounter  Medications   metoprolol succinate (TOPROL XL) 25 MG 24 hr tablet    Sig: Take 0.5 tablets (12.5 mg total) by mouth daily.    Dispense:  45 tablet    Refill:  3

## 2023-08-01 NOTE — Telephone Encounter (Signed)
-----   Message from Marca Ancona sent at 08/01/2023 12:34 AM EDT ----- Frequent PVCs, 6.2% of beats.  Short atrial tachycardia runs.  NO atrial fibrillation noted.  1. Would repeat 2 week monitor to look for atrial fibrillation.  2. Recommend low dose Toprol XL 12.5 mg daily try to try suppress PVCs and SVT and also for BP control.

## 2023-08-04 DIAGNOSIS — H838X3 Other specified diseases of inner ear, bilateral: Secondary | ICD-10-CM | POA: Diagnosis not present

## 2023-08-04 DIAGNOSIS — H903 Sensorineural hearing loss, bilateral: Secondary | ICD-10-CM | POA: Diagnosis not present

## 2023-08-12 NOTE — Progress Notes (Incomplete)
***In Progress***    Advanced [facility] Note  PCP: Dr. Clelia Croft Cardiology: Dr. Shirlee Latch HPI:  77 y.o. with history of HTN, aortic insufficiency and prior melanoma.  She had been doing well until 06/2023 when she developed right arm weakness and was found to have acute CVA.  MRI brain showed small acute cortical infarcts x 2 in the left frontal lobe as well as small acute subarachnoid hemorrage.  CTA head/neck with no significant carotid disease and no large vessel occlusion in brain. Echo showed EF 60-65%, RV normal, AI mild.  Atrial fibrillation was not seen in the hospital.    At last visit, 07/18/23, she returned for post-hospital followup.  She was wearing a Zio monitor.  She had been checking her heart rhythm on an Apple Watch.  BP was good; however, SBP still elevated at times in 130s-140s at home.  Her right arm strength seems completely normal to her at this point.  No exertional dyspnea, no chest pain.  No further neurologic symptoms. NSR with PVCs  Today she returns to HF clinic for pharmacist medication titration. At last visit with Dr Shirlee Latch on 07/18/23 losartan was increased to 50 mg in the morning and 25 mg in the evening. Following labs on 07/28/23 metoprolol succinate 12.5 mg daily was started due to 6.2% PVC on Zio.  Overall feeling ***. Dizziness, lightheadedness, fatigue:  Chest pain or palpitations:  How is your breathing?: *** SOB: Able to complete all ADLs. Activity level ***  Weight at home pounds. Takes furosemide/torsemide/bumex *** mg *** daily.  LEE PND/Orthopnea  Appetite *** Low-salt diet:   Physical Exam Cost/affordability of meds   Today she returns to HF clinic for pharmacist medication titration. At last visit with MD ***.   HF Medications: metoprolol 12.5 mg daily Losartan 50 mg in the morning and 25 mg in the evening  Has the patient been experiencing any side effects to the medications prescribed?  {YES NO:22349}  Does the patient have any  problems obtaining medications due to transportation or finances?   {YES NO:22349} Medicare A/B and BCBS supplement Understanding of regimen: {excellent/good/fair/poor:19665} Understanding of indications: {excellent/good/fair/poor:19665} Potential of compliance: {excellent/good/fair/poor:19665} Patient understands to avoid NSAIDs. Patient understands to avoid decongestants.    Pertinent Lab Values: Labs 07/28/23: Serum creatinine 0.77, BUN 12, Potassium 4.4, Sodium 136  Vital Signs: Weight: *** (last clinic weight: 127 lbs) Blood pressure: ***  Heart rate: ***   Assessment/Plan: *** 1. HTN: BP still mildly elevated at times.   - Increase losartan to 50 qam/25 qpm (taking 25 bid).  BMET today and in 10 days.  - She will continue to check BP daily and record readings, followup with my pharmacist in 3 wks for adjustment of BP meds as needed.  2. Aortic insufficiency: Mild on 7/24 echo.  3. Hyperlipidemia: Now on Crestor 10 mg daily, goal LDL < 55 with CVA.  Check lipids/LFTs in 2 months.  4. Family history of atrial fibrillation: Strong FH of atrial fibrillation and now has had CVA.  Atrial fibrillation has not been detected.  - She will wear Zio monitor x 1 month.  - She also has an Scientist, physiological and will use this to screen for atrial fibrillation long-term. Can also consider LINQ monitor. 5. CVA:  7/24 CVA, brain MRI showed small acute cortical infarcts x 2 in the left frontal lobe as well as small acute subarachnoid hemorrage.  CTA head/neck with no significant carotid disease and no large vessel occlusion in brain.  No atrial fibrillation has been detected yet.  - Continue monitoring for atrial fibrillation as above.  - Limited echo for bubble study (PFO).  - Continue ASA 81 for now.  - Continue statin.  - BP control.  - Has followup with neurology.   Follow up:    Karle Plumber, PharmD, BCPS, BCCP, CPP Heart Failure Clinic Pharmacist [phone]

## 2023-08-17 ENCOUNTER — Ambulatory Visit (HOSPITAL_COMMUNITY)
Admission: RE | Admit: 2023-08-17 | Discharge: 2023-08-17 | Disposition: A | Discharge status: Home / Self Care | Payer: Medicare Other | Source: Ambulatory Visit | Attending: Cardiology | Admitting: Cardiology

## 2023-08-17 VITALS — BP 126/82 | HR 98 | Wt 127.6 lb

## 2023-08-17 DIAGNOSIS — I351 Nonrheumatic aortic (valve) insufficiency: Secondary | ICD-10-CM | POA: Insufficient documentation

## 2023-08-17 DIAGNOSIS — Z8249 Family history of ischemic heart disease and other diseases of the circulatory system: Secondary | ICD-10-CM | POA: Insufficient documentation

## 2023-08-17 DIAGNOSIS — I10 Essential (primary) hypertension: Secondary | ICD-10-CM | POA: Insufficient documentation

## 2023-08-17 DIAGNOSIS — Z8673 Personal history of transient ischemic attack (TIA), and cerebral infarction without residual deficits: Secondary | ICD-10-CM | POA: Insufficient documentation

## 2023-08-17 DIAGNOSIS — Z85820 Personal history of malignant melanoma of skin: Secondary | ICD-10-CM | POA: Insufficient documentation

## 2023-08-17 DIAGNOSIS — E785 Hyperlipidemia, unspecified: Secondary | ICD-10-CM | POA: Insufficient documentation

## 2023-08-17 MED ORDER — ROSUVASTATIN CALCIUM 10 MG PO TABS: 10.0000 mg | ORAL_TABLET | Freq: Every day | ORAL | 3 refills | Status: DC

## 2023-08-17 MED ORDER — METOPROLOL SUCCINATE ER 25 MG PO TB24: 25.0000 mg | ORAL_TABLET | Freq: Every day | ORAL | 3 refills | Status: DC

## 2023-08-17 NOTE — Patient Instructions (Signed)
It was a pleasure seeing you today!  MEDICATIONS: -We are changing your medications today -Increase metoprolol succinate to 25 mg (1 tablet) daily -Call if you have questions about your medications.   NEXT APPOINTMENT: Return to clinic in 5 weeks with Dr. Shirlee Latch. Please bring your BP log to clinic.   In general, to take care of your heart failure: -Limit your fluid intake to 2 Liters (half-gallon) per day.   -Limit your salt intake to ideally 2-3 grams (2000-3000 mg) per day. -Weigh yourself daily and record, and bring that "weight diary" to your next appointment.  (Weight gain of 2-3 pounds in 1 day typically means fluid weight.) -The medications for your heart are to help your heart and help you live longer.   -Please contact us before stopping any of your heart medications.  Call the clinic at [phone] with questions or to reschedule future appointments.

## 2023-08-17 NOTE — Progress Notes (Signed)
Advanced Heart Failure Clinic Note   PCP: Dr. Clelia Croft Cardiology: Dr. Shirlee Latch  HPI:  77 y.o. with history of HTN, aortic insufficiency and prior melanoma.  She had been doing well until 06/2023 when she developed right arm weakness and was found to have acute CVA.  MRI brain showed small acute cortical infarcts x 2 in the left frontal lobe as well as small acute subarachnoid hemorrage.  CTA head/neck with no significant carotid disease and no large vessel occlusion in brain. Echo showed EF 60-65%, RV normal, AI mild.  Atrial fibrillation was not seen in the hospital.    At last visit, 07/18/23, she returned for post-hospital followup.  She was wearing a Zio monitor.  She had been checking her heart rhythm on an Apple Watch.  BP was good; however, SBP still elevated at times in 130s-140s at home.  Her right arm strength seemed completely normal to her at this point.  No exertional dyspnea, no chest pain.  No further neurologic symptoms. NSR with PVCs  Today she returns to HF clinic for pharmacist medication titration. At last visit with Dr Shirlee Latch on 07/18/23 losartan was increased to 50 mg in the morning and 25 mg in the evening. Following Zio result on 08/01/23 metoprolol succinate 12.5 mg daily was started due to 6.2% PVC burden. Overall she is doing well today. She denies dizziness, lightheadedness and fatigue. She has not had any CP or palpitations. Noted some soreness in her mouth but the patient thought it was due to dental procedure on Monday. Her BP is 126/82 today in clinic and has been running in the 120-130s/ 70-80 at home. However, she hasn't checked her BP at home in >1 week. She plans to start taking her BP again a few times per week. She is able to afford her meds.    BP Medications: metoprolol succinate 12.5 mg daily Losartan 50 mg in the morning and 25 mg in the evening  Has the patient been experiencing any side effects to the medications prescribed? No  Does the patient have any  problems obtaining medications due to transportation or finances?   No  Understanding of regimen: good Understanding of indications: good Potential of compliance: good Patient understands to avoid NSAIDs. Patient understands to avoid decongestants.    Pertinent Lab Values: Labs 07/28/23: Serum creatinine 0.77, BUN 12, Potassium 4.4, Sodium 136  Vital Signs: Weight: 127.6 lbs (last clinic weight: 127 lbs) Blood pressure: 126/82  Heart rate: 67   Assessment/Plan:  1. HTN: BP still mildly elevated at times, good in clinic today at 126/82.   - Continue losartan 50 mg qam/25 mg qpm   - Increase metoprolol succinate to 25 mg daily - She will start taking BP at home a couple of times a week again and will bring log to next clinic visit 2. Aortic insufficiency: Mild on 06/2023 echo.  3. Hyperlipidemia: Now on Crestor 10 mg daily, goal LDL < 55 with CVA.  Check lipids/LFTs in 2 months.  4. Family history of atrial fibrillation: Strong FH of atrial fibrillation and now has had CVA.  Atrial fibrillation has not been detected.  - Zio monitor x 1 month complete, results showed frequent PVCs, 6.2% of beats. Short atrial tachycardia runs. NO atrial fibrillation noted.  - She also has an Scientist, physiological and will use this to screen for atrial fibrillation long-term. Can also consider LINQ monitor. - Increase metoprolol succinate to 25 mg daily as above 5. CVA:  06/2023 CVA,  brain MRI showed small acute cortical infarcts x 2 in the left frontal lobe as well as small acute subarachnoid hemorrage.  CTA head/neck with no significant carotid disease and no large vessel occlusion in brain.  No atrial fibrillation has been detected yet.  - Continue monitoring for atrial fibrillation as above.  - Limited echo for bubble study (PFO).  - Continue ASA 81 for now.  - Continue statin.  - BP control.  - Has followup with neurology 08/2023.   Follow up: 09/19/23 with Dr. Jonn Shingles, PharmD, BCPS, [facility],  CPP Heart Failure Clinic Pharmacist [phone]

## 2023-08-18 NOTE — Addendum Note (Signed)
Encounter addended by: Crissie Figures, RN on: 08/18/2023 3:03 PM  Actions taken: Imaging Exam ended

## 2023-08-18 NOTE — Addendum Note (Signed)
Encounter addended by: Crissie Figures, RN on: 08/18/2023 3:05 PM  Actions taken: Imaging Exam ended

## 2023-08-23 ENCOUNTER — Telehealth (HOSPITAL_COMMUNITY): Payer: Self-pay | Admitting: *Deleted

## 2023-08-23 NOTE — Telephone Encounter (Signed)
Called patient's husband per Dr. Shirlee Latch with following Zio results and instructions:  "No atrial fibrillation but still with frequent PVCs.  I think she increased Toprol XL to 25 mg daily after completing this Zio monitor.  Would make sure that she is not taking the higher dose of Toprol XL (25 mg daily)."  Husband confirmed she is taking Toprol XL 25 mg daily. Asked them to call us at [phone] if any questions or concerns.

## 2023-09-01 ENCOUNTER — Ambulatory Visit: Payer: Medicare Other | Admitting: Neurology

## 2023-09-14 DIAGNOSIS — I10 Essential (primary) hypertension: Secondary | ICD-10-CM | POA: Diagnosis not present

## 2023-09-14 DIAGNOSIS — E559 Vitamin D deficiency, unspecified: Secondary | ICD-10-CM | POA: Diagnosis not present

## 2023-09-14 DIAGNOSIS — E785 Hyperlipidemia, unspecified: Secondary | ICD-10-CM | POA: Diagnosis not present

## 2023-09-19 ENCOUNTER — Encounter (HOSPITAL_COMMUNITY): Payer: Self-pay | Admitting: Cardiology

## 2023-09-19 ENCOUNTER — Ambulatory Visit (HOSPITAL_COMMUNITY)
Admission: RE | Admit: 2023-09-19 | Discharge: 2023-09-19 | Disposition: A | Discharge status: Home / Self Care | Payer: Medicare Other | Source: Ambulatory Visit | Attending: Cardiology | Admitting: Cardiology

## 2023-09-19 VITALS — BP 160/80 | HR 64 | Wt 126.0 lb

## 2023-09-19 DIAGNOSIS — Z79899 Other long term (current) drug therapy: Secondary | ICD-10-CM | POA: Insufficient documentation

## 2023-09-19 DIAGNOSIS — R011 Cardiac murmur, unspecified: Secondary | ICD-10-CM | POA: Diagnosis not present

## 2023-09-19 DIAGNOSIS — E7800 Pure hypercholesterolemia, unspecified: Secondary | ICD-10-CM

## 2023-09-19 DIAGNOSIS — Z8673 Personal history of transient ischemic attack (TIA), and cerebral infarction without residual deficits: Secondary | ICD-10-CM | POA: Insufficient documentation

## 2023-09-19 DIAGNOSIS — I351 Nonrheumatic aortic (valve) insufficiency: Secondary | ICD-10-CM | POA: Insufficient documentation

## 2023-09-19 DIAGNOSIS — Z8249 Family history of ischemic heart disease and other diseases of the circulatory system: Secondary | ICD-10-CM | POA: Diagnosis not present

## 2023-09-19 DIAGNOSIS — I4891 Unspecified atrial fibrillation: Secondary | ICD-10-CM | POA: Insufficient documentation

## 2023-09-19 DIAGNOSIS — E785 Hyperlipidemia, unspecified: Secondary | ICD-10-CM | POA: Diagnosis not present

## 2023-09-19 DIAGNOSIS — I493 Ventricular premature depolarization: Secondary | ICD-10-CM | POA: Diagnosis not present

## 2023-09-19 DIAGNOSIS — I10 Essential (primary) hypertension: Secondary | ICD-10-CM | POA: Diagnosis not present

## 2023-09-19 LAB — COMPREHENSIVE METABOLIC PANEL
ALT: 16 U/L (ref 0–44)
AST: 19 U/L (ref 15–41)
Albumin: 4.7 g/dL (ref 3.5–5.0)
Alkaline Phosphatase: 55 U/L (ref 38–126)
Anion gap: 11 (ref 5–15)
BUN: 15 mg/dL (ref 8–23)
CO2: 27 mmol/L (ref 22–32)
Calcium: 10.3 mg/dL (ref 8.9–10.3)
Chloride: 101 mmol/L (ref 98–111)
Creatinine, Ser: 0.81 mg/dL (ref 0.44–1.00)
GFR, Estimated: >60 mL/min (ref >60)
Glucose, Bld: 95 mg/dL (ref 70–99)
Potassium: 4.2 mmol/L (ref 3.5–5.1)
Sodium: 139 mmol/L (ref 135–145)
Total Bilirubin: 0.9 mg/dL (ref 0.3–1.2)
Total Protein: 7.2 g/dL (ref 6.5–8.1)

## 2023-09-19 LAB — LIPID PANEL
Cholesterol: 150 mg/dL (ref 0–200)
HDL: 90 mg/dL (ref >40)
LDL Cholesterol: 37 mg/dL (ref 0–99)
Total CHOL/HDL Ratio: 1.7 RATIO
Triglycerides: 113 mg/dL (ref <150)
VLDL: 23 mg/dL (ref 0–40)

## 2023-09-19 NOTE — Progress Notes (Signed)
PCP: Dr. Clelia Croft Cardiology: Dr. Shirlee Latch  77 y.o. with history of HTN, aortic insufficiency and prior melanoma.  She had been doing well until 7/24 when she developed right arm weakness and was found to have acute CVA.  MRI brain showed small acute cortical infarcts x 2 in the left frontal lobe as well as small acute subarachnoid hemorrage.  CTA head/neck with no significant carotid disease and no large vessel occlusion in brain. Echo showed EF 60-65%, RV normal, AI mild.  Atrial fibrillation was not seen in the hospital.  She wore Zio monitor for a total of 1 month in 7/24-8/24, this showed PVCs but no atrial fibrillation. Limited echo in 8/24 showed EF 55-60%, negative bubble study.   She returns for followup of CVA and HTN.  BP is high in the office today but has been well-controlled at home.  SBP 110s-120s.  She has been wearing her Apple Watch most of the time, and has noted on atrial fibrillation on the Apple Watch.  No palpitations (does not feel the PVCs).  No significant exertional dyspnea or chest pain with her usual activities.  No lightheadedness.    ECG: NSR with septal Qs (personally reviewed)  Labs (5/16): K 4, creatinine 0.61 Labs (7/21): LDL 123, HDL 85, K 4.1, creatinine 0.8, TSH normal, hgb 13 Labs (7/24): LDL 130, HDL 101, K 3.7, creatinine 0.77, hgb 13.4 Labs (8/24): K 4.4, creatinine 0.77  PMH: 1. Venous insufficiency. 2. HTN 3. H/o melanoma. 4. Migraines 5. Psoriasis 6. Aortic insufficiency: Echo (4/16) with EF 55-60%, mild to moderate AI, trileaflet aortic valve.  - Echo (9/17): EF 60-65%, mild-moderate AI, mild MR.  - Echo (7/24): EF 60-65%, RV normal, AI mild 7. CVA: 7/24 CVA, brain MRI showed small acute cortical infarcts x 2 in the left frontal lobe as well as small acute subarachnoid hemorrage.  CTA head/neck with no significant carotid disease and no large vessel occlusion in brain.  - Echo (8/24) with EF 55-60%, negative bubble study.  - Zio monitor 7/24-8/24  showed 8.4% PVCs, no atrial fibrillation.  8. Frequent PVCs: 8.4% on Zio monitor in 8/24.   SH: Married, 5 children, nonsmoker, lives in [place].  FH: Mother, father and sister with atrial fibrillation; brother with CAD.  ROS: All systems reviewed and negative except as per HPI.   Current Outpatient Medications  Medication Sig Dispense Refill   acetaminophen (TYLENOL) 325 MG tablet Take 650 mg by mouth as needed for headache.     Apremilast (OTEZLA) 30 MG TABS Take 30 mg by mouth 2 (two) times daily.     aspirin 81 MG chewable tablet Chew 1 tablet (81 mg total) by mouth daily. 90 tablet 0   clobetasol cream (TEMOVATE) 0.05 % Apply 1 Application topically as needed (psoriasis).     co-enzyme Q-10 30 MG capsule Take 30 mg by mouth daily. 1c po daily     estradiol (ESTRACE) 0.1 MG/GM vaginal cream Place 1 Applicatorful vaginally as needed (dryness).     Fluocinolone Acetonide Body 0.01 % OIL Apply 1 Application topically as needed (psoriasis). To scalp     losartan (COZAAR) 50 MG tablet Take 1 tablet (50 mg total) by mouth in the morning AND 0.5 tablets (25 mg total) every evening. 45 tablet 11   metoprolol succinate (TOPROL XL) 25 MG 24 hr tablet Take 1 tablet (25 mg total) by mouth daily. 90 tablet 3   Multiple Vitamins-Minerals (MULTIVITAMIN WITH MINERALS) tablet Take 1 tablet by mouth daily.  mupirocin ointment (BACTROBAN) 2 % Apply 1 Application topically as needed (cuts).     rosuvastatin (CRESTOR) 10 MG tablet Take 1 tablet (10 mg total) by mouth daily. 90 tablet 3   triamcinolone cream (KENALOG) 0.1 % Apply 1 Application topically as needed (psoriasis).     Vitamin D, Ergocalciferol, (DRISDOL) 50000 units CAPS capsule Take 50,000 Units by mouth every 7 (seven) days.     No current facility-administered medications for this encounter.   BP (!) 160/80   Pulse 64   Wt 57.2 kg (126 lb)   LMP 12/28/1999   SpO2 100%   BMI 21.97 kg/m  General: NAD Neck: No JVD, no thyromegaly  or thyroid nodule.  Lungs: Clear to auscultation bilaterally with normal respiratory effort. CV: Nondisplaced PMI.  Heart regular S1/S2, no S3/S4, no murmur.  No peripheral edema.  No carotid bruit.  Normal pedal pulses.  Abdomen: Soft, nontender, no hepatosplenomegaly, no distention.  Skin: Intact without lesions or rashes.  Neurologic: Alert and oriented x 3.  Psych: Normal affect. Extremities: No clubbing or cyanosis.  HEENT: Normal.   Assessment/Plan: 1. HTN: BP appears well-controlled at home.    - Continue losartan 50 qam/25 qpm.  BMET today.  - She will continue to check BP daily and record readings.  2. Aortic insufficiency: Mild on 7/24 echo.  3. Hyperlipidemia: Now on Crestor 10 mg daily, goal LDL < 55 with CVA.  Check lipids/lipoprotein(a)/LFTs today.   4. Family history of atrial fibrillation: Strong FH of atrial fibrillation and now has had CVA.  Atrial fibrillation has not been detected by 1 month Zio monitor or Apple Watch.   - Continue to use Apple Watch to screen for atrial fibrillation long-term.  5. CVA:  7/24 CVA, brain MRI showed small acute cortical infarcts x 2 in the left frontal lobe as well as small acute subarachnoid hemorrage.  CTA head/neck with no significant carotid disease and no large vessel occlusion in brain.  No atrial fibrillation has been detected yet. Bubble study negative for PFO/ASD in 8/24.  - Continue monitoring for atrial fibrillation as above.  - Continue ASA 81 for now.  - Continue statin.  - BP control.  - Has followup with neurology.  6. PVCs: up to 8.4% on Zio monitor in 8/24.  Does not feel palpitations.  - Continue Toprol XL 25 mg daily.                     She will followup in 6 months.   Chelsea Barnett 09/19/2023

## 2023-09-19 NOTE — Patient Instructions (Signed)
There has been no changes to your mediations.  Labs done today, your results will be available in MyChart, we will contact you for abnormal readings.  Your physician recommends that you schedule a follow-up appointment in: 6 months ( March 2025) ** PLEASE CALL THE OFFICE IN [place] TO ARRANGE YOUR FOLLOW UP APPOINTMENT. **  If you have any questions or concerns before your next appointment please send [place] a message through [place] or call our office at [phone].    TO LEAVE A MESSAGE FOR THE NURSE SELECT OPTION 2, PLEASE LEAVE A MESSAGE INCLUDING: YOUR NAME DATE OF BIRTH CALL BACK NUMBER REASON FOR CALL**this is important as we prioritize the call backs  YOU WILL RECEIVE A CALL BACK THE SAME DAY AS LONG AS YOU CALL BEFORE 4:00 PM  At the [facility], you and your health needs are our priority. As part of our continuing mission to provide you with exceptional heart care, we have created designated Provider Care Teams. These Care Teams include your primary Cardiologist (physician) and Advanced Practice Providers (APPs- Physician Assistants and Nurse Practitioners) who all work together to provide you with the care you need, when you need it.   You may see any of the following providers on your designated Care Team at your next follow up: Dr Arvilla Meres Dr Marca Ancona Dr. Marcos Eke, NP Robbie Lis, [place] [facility] [place], [place] Brynda Peon, NP Karle Plumber, PharmD   Please be sure to bring in all your medications bottles to every appointment.    Thank you for choosing [facility]

## 2023-09-20 LAB — LIPOPROTEIN A (LPA): Lipoprotein (a): 15.4 nmol/L (ref <75.0)

## 2023-09-21 DIAGNOSIS — Z8679 Personal history of other diseases of the circulatory system: Secondary | ICD-10-CM | POA: Diagnosis not present

## 2023-09-21 DIAGNOSIS — E785 Hyperlipidemia, unspecified: Secondary | ICD-10-CM | POA: Diagnosis not present

## 2023-09-21 DIAGNOSIS — Z23 Encounter for immunization: Secondary | ICD-10-CM | POA: Diagnosis not present

## 2023-09-21 DIAGNOSIS — Z1331 Encounter for screening for depression: Secondary | ICD-10-CM | POA: Diagnosis not present

## 2023-09-21 DIAGNOSIS — Z1339 Encounter for screening examination for other mental health and behavioral disorders: Secondary | ICD-10-CM | POA: Diagnosis not present

## 2023-09-21 DIAGNOSIS — Z0000 Encounter for general adult medical examination without abnormal findings: Secondary | ICD-10-CM | POA: Diagnosis not present

## 2023-09-21 DIAGNOSIS — D849 Immunodeficiency, unspecified: Secondary | ICD-10-CM | POA: Diagnosis not present

## 2023-09-21 DIAGNOSIS — I700 Atherosclerosis of aorta: Secondary | ICD-10-CM | POA: Diagnosis not present

## 2023-09-21 DIAGNOSIS — I10 Essential (primary) hypertension: Secondary | ICD-10-CM | POA: Diagnosis not present

## 2023-09-21 DIAGNOSIS — L409 Psoriasis, unspecified: Secondary | ICD-10-CM | POA: Diagnosis not present

## 2023-09-21 DIAGNOSIS — G3184 Mild cognitive impairment, so stated: Secondary | ICD-10-CM | POA: Diagnosis not present

## 2023-09-21 DIAGNOSIS — D692 Other nonthrombocytopenic purpura: Secondary | ICD-10-CM | POA: Diagnosis not present

## 2023-09-21 DIAGNOSIS — M8580 Other specified disorders of bone density and structure, unspecified site: Secondary | ICD-10-CM | POA: Diagnosis not present

## 2023-09-21 DIAGNOSIS — Z8673 Personal history of transient ischemic attack (TIA), and cerebral infarction without residual deficits: Secondary | ICD-10-CM | POA: Diagnosis not present

## 2023-09-26 ENCOUNTER — Ambulatory Visit (INDEPENDENT_AMBULATORY_CARE_PROVIDER_SITE_OTHER): Payer: Medicare Other | Admitting: Neurology

## 2023-09-26 ENCOUNTER — Ambulatory Visit: Payer: Medicare Other | Admitting: Neurology

## 2023-09-26 ENCOUNTER — Encounter: Payer: Self-pay | Admitting: Neurology

## 2023-09-26 VITALS — BP 125/71 | HR 76 | Ht 63.5 in | Wt 127.0 lb

## 2023-09-26 DIAGNOSIS — G459 Transient cerebral ischemic attack, unspecified: Secondary | ICD-10-CM

## 2023-09-26 DIAGNOSIS — R202 Paresthesia of skin: Secondary | ICD-10-CM | POA: Diagnosis not present

## 2023-09-26 DIAGNOSIS — R200 Anesthesia of skin: Secondary | ICD-10-CM

## 2023-09-26 DIAGNOSIS — G3184 Mild cognitive impairment, so stated: Secondary | ICD-10-CM

## 2023-09-26 DIAGNOSIS — I680 Cerebral amyloid angiopathy: Secondary | ICD-10-CM

## 2023-09-26 NOTE — Progress Notes (Signed)
[facility] [address] [place]. [place] [address] [phone]       OFFICE FOLLOW-UP NOTE  [name] Date of Birth:  12-Dec-1946 Medical Record Number:  [id number]   HPI: Chelsea Barnett is a 77 year old pleasant Caucasian lady seen today for initial office follow-up visit following hospital consultation for TIA in July 2024.  She is accompanied by her husband.  History is obtained from them and review of electronic medical records.  I personally reviewed pertinent available imaging films in PACS.  She has past medical history of migraines with aura, psoriasis, skin cancer who presented on 06/28/2023 with 2 transient episodes of right upper extremity numbness lasting only 1 to 2 minutes.  She denied any weakness though stated her right arm felt heavy.  This does not involve her face or leg.  She had no accompanying headache or any visual disturbance before during or after these episodes.  She had an MRI scan of the brain which showed a small volume acute subarachnoid hemorrhage involving left frontal and parietal lobes and 3 mm acute cortical infarct in the left MCA territory.  There is also questionable 4 mm acute cortical infarct in posterior left frontal lobe versus artifact from adjacent subarachnoid hemorrhage.  There are mild changes of chronic small vessel disease.  There were few areas of punctate chronic microhemorrhages noted in the supratentorial cortex raising concern for possible amyloid angiopathy versus hypertensive small vessel disease.  CT angiogram of the brain and neck showed no large vessel stenosis or occlusion.  There is mild aortic atherosclerosis noted.  Echocardiogram showed ejection fraction of 60 to 65% left atrium size is normal.  LDL cholesterol 130 mg percent.  Hemoglobin A1c was 5.4.  EEG was normal without seizure activity.  Patient subsequently underwent 30-day external heart monitor which did not show evidence of paroxysmal A-fib.  Patient  was started on aspirin alone given her microhemorrhages and concern for amyloid angiopathy.  She is tolerating it well with minor bruising and no bleeding.  She states she has had no further episodes of numbness.  She has had no other stroke or TIA symptoms.  She has remote history of migraines but states she has not had them for years.  Her blood pressure is under good control.  She is tolerating Crestor well without muscle aches and pains until she started taking co-Q10.  Last lipid profile on 09/14/2023 showed LDL cholesterol to have improved to 45 mg percent.  CMP, CBC, vitamin D and TSH were all normal.  The patient on inquiry admits to mild short-term memory difficulties which she had even before her TIA episodes in appears not to have gotten any worse.  She has trouble remembering names at times but remember them later.  She is still independent in all activities of daily living and managing her own affairs.  She has no new complaints.  ROS:   14 system review of systems is positive for memory difficulties, numbness, forgetfulness and all other systems negative  PMH:  Past Medical History:  Diagnosis Date   Basal cell cancer    right leg   Heart murmur    Hemorrhoids    Melanoma (HCC) 2005   on left arm   Migraines    with aura   Post-operative nausea and vomiting    after 2 surgeries   Psoriasis    Vaginal delivery    times 6    Social History:  Social History   Socioeconomic History  Marital status: Married    Spouse name: Not on file   Number of children: Not on file   Years of education: Not on file   Highest education level: Not on file  Occupational History   Not on file  Tobacco Use   Smoking status: Never   Smokeless tobacco: Never  Substance and Sexual Activity   Alcohol use: Not Currently    Alcohol/week: 0.0 standard drinks of alcohol   Drug use: No   Sexual activity: Not Currently    Partners: Male    Birth control/protection: Surgical    Comment: BTL,  BSO  Other Topics Concern   Not on file  Social History Narrative   Not on file   Social Determinants of Health   Financial Resource Strain: Not on file  Food Insecurity: No Food Insecurity (06/28/2023)   Hunger Vital Sign    Worried About Running Out of Food in the Last Year: Never true    Ran Out of Food in the Last Year: Never true  Transportation Needs: No Transportation Needs (06/28/2023)   PRAPARE - Administrator, Civil Service (Medical): No    Lack of Transportation (Non-Medical): No  Physical Activity: Not on file  Stress: Not on file  Social Connections: Not on file  Intimate Partner Violence: Not At Risk (06/28/2023)   Humiliation, Afraid, Rape, and Kick questionnaire    Fear of Current or Ex-Partner: No    Emotionally Abused: No    Physically Abused: No    Sexually Abused: No    Medications:   Current Outpatient Medications on File Prior to Visit  Medication Sig Dispense Refill   acetaminophen (TYLENOL) 325 MG tablet Take 650 mg by mouth as needed for headache.     Apremilast (OTEZLA) 30 MG TABS Take 30 mg by mouth 2 (two) times daily.     aspirin 81 MG chewable tablet Chew 1 tablet (81 mg total) by mouth daily. 90 tablet 0   clobetasol cream (TEMOVATE) 0.05 % Apply 1 Application topically as needed (psoriasis).     co-enzyme Q-10 30 MG capsule Take 30 mg by mouth daily. 1c po daily     estradiol (ESTRACE) 0.1 MG/GM vaginal cream Place 1 Applicatorful vaginally as needed (dryness).     Fluocinolone Acetonide Body 0.01 % OIL Apply 1 Application topically as needed (psoriasis). To scalp     losartan (COZAAR) 50 MG tablet Take 1 tablet (50 mg total) by mouth in the morning AND 0.5 tablets (25 mg total) every evening. 45 tablet 11   metoprolol succinate (TOPROL XL) 25 MG 24 hr tablet Take 1 tablet (25 mg total) by mouth daily. 90 tablet 3   Multiple Vitamins-Minerals (MULTIVITAMIN WITH MINERALS) tablet Take 1 tablet by mouth daily.     mupirocin ointment  (BACTROBAN) 2 % Apply 1 Application topically as needed (cuts).     rosuvastatin (CRESTOR) 10 MG tablet Take 1 tablet (10 mg total) by mouth daily. 90 tablet 3   triamcinolone cream (KENALOG) 0.1 % Apply 1 Application topically as needed (psoriasis).     Vitamin D, Ergocalciferol, (DRISDOL) 50000 units CAPS capsule Take 50,000 Units by mouth every 7 (seven) days.     No current facility-administered medications on file prior to visit.    Allergies:  No Known Allergies  Physical Exam General: well developed, well nourished, seated, in no evident distress Head: head normocephalic and atraumatic.  Neck: supple with no carotid or supraclavicular bruits Cardiovascular: regular rate and  rhythm, no murmurs Musculoskeletal: no deformity Skin:  no rash/petichiae Vascular:  Normal pulses all extremities Vitals:   09/26/23 0908  BP: 125/71  Pulse: 76   Neurologic Exam Mental Status: Awake and fully alert. Oriented to place and time. Recent and remote memory intact. Attention span, concentration and fund of knowledge appropriate. Mood and affect appropriate.  Diminished recall 2/3.  Clock drawing 4/4.  Able to name 14 animals which can walk on 4 legs.  Montreal oral cognitive assessment score 25/30 Cranial Nerves: Fundoscopic exam reveals sharp disc margins. Pupils equal, briskly reactive to light. Extraocular movements full without nystagmus. Visual fields full to confrontation. Hearing intact. Facial sensation intact. Face, tongue, palate moves normally and symmetrically.  Motor: Normal bulk and tone. Normal strength in all tested extremity muscles. Sensory.: intact to touch ,pinprick .position and vibratory sensation.  Coordination: Rapid alternating movements normal in all extremities. Finger-to-nose and heel-to-shin performed accurately bilaterally. Gait and Station: Arises from chair without difficulty. Stance is normal. Gait demonstrates normal stride length and balance . Able to heel, toe  and tandem walk with moderate difficulty.  Reflexes: 1+ and symmetric. Toes downgoing.   NIHSS  0 Modified Rankin  1    09/26/2023    9:44 AM  Montreal Cognitive Assessment   Visuospatial/ Executive (0/5) 5  Naming (0/3) 2  Attention: Read list of digits (0/2) 2  Attention: Read list of letters (0/1) 1  Attention: Serial 7 subtraction starting at 100 (0/3) 3  Language: Repeat phrase (0/2) 1  Language : Fluency (0/1) 1  Abstraction (0/2) 2  Delayed Recall (0/5) 2  Orientation (0/6) 6  Total 25     ASSESSMENT: 77 year old Caucasian lady with 2 transient episodes of right upper extremity numbness lasting a few minutes possibly amyloid spells rather than TIAs from small vessel disease given abnormal MRI scan showing left frontal subarachnoid hemorrhage and microhemorrhages.  She also has mild age-appropriate cognitive impairment with may be at risk for progression to dementia.     PLAN: I had a long d/w patient and her husband about her recent TIAs, small stroke, cerebral hemorrhage and possibility of amyloid angiopathy,, risk for recurrent stroke/TIAs, personally independently reviewed imaging studies and stroke evaluation results and answered questions.Continue aspirin 81 mg daily  for secondary stroke prevention and maintain strict control of hypertension with blood pressure goal below 130/90, diabetes with hemoglobin A1c goal below 6.5% and lipids with LDL cholesterol goal below 70 mg/dL. I also advised the patient to eat a healthy diet with plenty of whole grains, cereals, fruits and vegetables, exercise regularly and maintain ideal body weight .she also has mild cognitive impairment which appears to be age-appropriate.  Recommend she increase participation in cognitively challenging activities like solving crossword puzzles, playing bridge and sudoku.  Patient may be at risk of developing progressive dementia in the future but she is quite functional at this moment hence I would refrain  from any further workup at this time.  We also discussed memory compensation strategies.  Followup in the future with me in 6 months or call earlier if necessary. Greater than 50% of time during this prolonged 40  minute visit was spent on counseling,explanation of diagnosis of TIA versus amyloid spells, mild cognitive impairment and amyloid angiopathy, planning of further management, discussion with patient and family and coordination of care Delia Heady, MD Note: This document was prepared with digital dictation and possible smart phrase technology. Any transcriptional errors that result from this process are unintentional

## 2023-09-26 NOTE — Patient Instructions (Signed)
I had a long d/w patient and her husband about her recent TIAs, small stroke, cerebral hemorrhage and possibility of amyloid angiopathy,, risk for recurrent stroke/TIAs, personally independently reviewed imaging studies and stroke evaluation results and answered questions.Continue aspirin 81 mg daily  for secondary stroke prevention and maintain strict control of hypertension with blood pressure goal below 130/90, diabetes with hemoglobin A1c goal below 6.5% and lipids with LDL cholesterol goal below 70 mg/dL. I also advised the patient to eat a healthy diet with plenty of whole grains, cereals, fruits and vegetables, exercise regularly and maintain ideal body weight .she also has mild cognitive impairment which appears to be age-appropriate.  Recommend she increase participation in cognitively challenging activities like solving crossword puzzles, playing bridge and sudoku.  Patient may be at risk of developing progressive dementia in the future but she is quite functional at this moment hence I would refrain from any further workup at this time.  We also discussed memory compensation strategies.  Followup in the future with me in 6 months or call earlier if necessary.  Memory Compensation Strategies  Use "WARM" strategy.  W= write it down  A= associate it  R= repeat it  M= make a mental note  2.   You can keep a Glass blower/designer.  Use a 3-ring notebook with sections for the following: calendar, important names and phone numbers,  medications, doctors' names/phone numbers, lists/reminders, and a section to journal what you did  each day.   3.    Use a calendar to write appointments down.  4.    Write yourself a schedule for the day.  This can be placed on the calendar or in a separate section of the Memory Notebook.  Keeping a  regular schedule can help memory.  5.    Use medication organizer with sections for each day or morning/evening pills.  You may need help loading it  6.    Keep a basket,  or pegboard by the door.  Place items that you need to take out with you in the basket or on the pegboard.  You may also want to  include a message board for reminders.  7.    Use sticky notes.  Place sticky notes with reminders in a place where the task is performed.  For example: " turn off the  stove" placed by the stove, "lock the door" placed on the door at eye level, " take your medications" on  the bathroom mirror or by the place where you normally take your medications.  8.    Use alarms/timers.  Use while cooking to remind yourself to check on food or as a reminder to take your medicine, or as a  reminder to make a call, or as a reminder to perform another task, etc.

## 2023-11-01 DIAGNOSIS — Z1231 Encounter for screening mammogram for malignant neoplasm of breast: Secondary | ICD-10-CM | POA: Diagnosis not present

## 2023-11-22 DIAGNOSIS — L820 Inflamed seborrheic keratosis: Secondary | ICD-10-CM | POA: Diagnosis not present

## 2023-11-22 DIAGNOSIS — Z85820 Personal history of malignant melanoma of skin: Secondary | ICD-10-CM | POA: Diagnosis not present

## 2023-11-22 DIAGNOSIS — Z85828 Personal history of other malignant neoplasm of skin: Secondary | ICD-10-CM | POA: Diagnosis not present

## 2023-11-22 DIAGNOSIS — L400 Psoriasis vulgaris: Secondary | ICD-10-CM | POA: Diagnosis not present

## 2023-12-26 ENCOUNTER — Ambulatory Visit (INDEPENDENT_AMBULATORY_CARE_PROVIDER_SITE_OTHER): Payer: Medicare Other | Admitting: Audiology

## 2023-12-26 DIAGNOSIS — H9190 Unspecified hearing loss, unspecified ear: Secondary | ICD-10-CM

## 2023-12-26 NOTE — Progress Notes (Signed)
  [address] [place], [place] [address] [phone]  Hearing Aid Check     LYNDON FISS dropped off devices for a hearing aid check.   Chief complaint: Patient reported Jill Side at check -in that she needed the retention wires to be added to her hearing aids' receivers.  Actions taken: Added the new retention wire to both hearing aids. Alison at check - in called the patient to pick up the devices.  Services fee: $0 was paid at checkout. Patient is still under warranty.    Recommend: Return for a hearing aid check ,as needed. Return for a hearing evaluation and to see an ENT, if concerns with hearing changes arise.    Kadarius Cuffe MARIE LEROUX-MARTINEZ, AUD

## 2024-01-03 DIAGNOSIS — R300 Dysuria: Secondary | ICD-10-CM | POA: Diagnosis not present

## 2024-01-17 DIAGNOSIS — R300 Dysuria: Secondary | ICD-10-CM | POA: Diagnosis not present

## 2024-01-17 DIAGNOSIS — R319 Hematuria, unspecified: Secondary | ICD-10-CM | POA: Diagnosis not present

## 2024-02-07 ENCOUNTER — Telehealth (HOSPITAL_COMMUNITY): Payer: Self-pay | Admitting: Cardiology

## 2024-02-07 DIAGNOSIS — R0600 Dyspnea, unspecified: Secondary | ICD-10-CM

## 2024-02-07 DIAGNOSIS — R071 Chest pain on breathing: Secondary | ICD-10-CM

## 2024-02-07 DIAGNOSIS — I4891 Unspecified atrial fibrillation: Secondary | ICD-10-CM

## 2024-02-07 DIAGNOSIS — R609 Edema, unspecified: Secondary | ICD-10-CM

## 2024-02-07 NOTE — Telephone Encounter (Signed)
Add on given for 2/24 @ 140

## 2024-02-07 NOTE — Telephone Encounter (Signed)
Per Dr Shirlee Latch Please get her an appointment to see me for increased fatigue next week.    -LMOM

## 2024-02-14 NOTE — Telephone Encounter (Signed)
 Per DM pre visit labs are needed  [facility] to schedule  This patient is coming to see me next week. Please have her come in this week to get CMET, CBC, TSH, BNP

## 2024-02-15 DIAGNOSIS — R058 Other specified cough: Secondary | ICD-10-CM | POA: Diagnosis not present

## 2024-02-15 DIAGNOSIS — I493 Ventricular premature depolarization: Secondary | ICD-10-CM | POA: Insufficient documentation

## 2024-02-15 DIAGNOSIS — R5383 Other fatigue: Secondary | ICD-10-CM | POA: Diagnosis not present

## 2024-02-15 DIAGNOSIS — G3184 Mild cognitive impairment, so stated: Secondary | ICD-10-CM | POA: Diagnosis not present

## 2024-02-15 DIAGNOSIS — J3489 Other specified disorders of nose and nasal sinuses: Secondary | ICD-10-CM | POA: Diagnosis not present

## 2024-02-15 DIAGNOSIS — Z1152 Encounter for screening for COVID-19: Secondary | ICD-10-CM | POA: Diagnosis not present

## 2024-02-15 DIAGNOSIS — D849 Immunodeficiency, unspecified: Secondary | ICD-10-CM | POA: Diagnosis not present

## 2024-02-16 ENCOUNTER — Ambulatory Visit (HOSPITAL_COMMUNITY)
Admission: RE | Admit: 2024-02-16 | Discharge: 2024-02-16 | Disposition: A | Discharge status: Home / Self Care | Payer: Medicare Other | Source: Ambulatory Visit | Attending: Cardiology | Admitting: Cardiology

## 2024-02-16 DIAGNOSIS — Z1389 Encounter for screening for other disorder: Secondary | ICD-10-CM | POA: Diagnosis not present

## 2024-02-16 DIAGNOSIS — R609 Edema, unspecified: Secondary | ICD-10-CM | POA: Diagnosis not present

## 2024-02-16 DIAGNOSIS — R0600 Dyspnea, unspecified: Secondary | ICD-10-CM | POA: Diagnosis not present

## 2024-02-16 DIAGNOSIS — I4891 Unspecified atrial fibrillation: Secondary | ICD-10-CM | POA: Diagnosis not present

## 2024-02-16 DIAGNOSIS — R071 Chest pain on breathing: Secondary | ICD-10-CM | POA: Insufficient documentation

## 2024-02-16 LAB — COMPREHENSIVE METABOLIC PANEL
ALT: 14 U/L (ref 0–44)
AST: 18 U/L (ref 15–41)
Albumin: 4.3 g/dL (ref 3.5–5.0)
Alkaline Phosphatase: 45 U/L (ref 38–126)
Anion gap: 13 (ref 5–15)
BUN: 12 mg/dL (ref 8–23)
CO2: 27 mmol/L (ref 22–32)
Calcium: 10.5 mg/dL — ABNORMAL HIGH (ref 8.9–10.3)
Chloride: 101 mmol/L (ref 98–111)
Creatinine, Ser: 0.72 mg/dL (ref 0.44–1.00)
GFR, Estimated: >60 mL/min (ref >60)
Glucose, Bld: 112 mg/dL — ABNORMAL HIGH (ref 70–99)
Potassium: 3.9 mmol/L (ref 3.5–5.1)
Sodium: 141 mmol/L (ref 135–145)
Total Bilirubin: 0.8 mg/dL (ref 0.0–1.2)
Total Protein: 6.9 g/dL (ref 6.5–8.1)

## 2024-02-16 LAB — CBC
HCT: 39 % (ref 36.0–46.0)
Hemoglobin: 13 g/dL (ref 12.0–15.0)
MCH: 30.6 pg (ref 26.0–34.0)
MCHC: 33.3 g/dL (ref 30.0–36.0)
MCV: 91.8 fL (ref 80.0–100.0)
Platelets: 266 10*3/uL (ref 150–400)
RBC: 4.25 MIL/uL (ref 3.87–5.11)
RDW: 12.4 % (ref 11.5–15.5)
WBC: 6.1 10*3/uL (ref 4.0–10.5)
nRBC: 0 % (ref 0.0–0.2)

## 2024-02-16 LAB — BRAIN NATRIURETIC PEPTIDE: B Natriuretic Peptide: 64.3 pg/mL (ref 0.0–100.0)

## 2024-02-16 LAB — TSH: TSH: 0.997 u[IU]/mL (ref 0.350–4.500)

## 2024-02-16 NOTE — Telephone Encounter (Signed)
 Spoke w/pt, orders placed, appt sch for today

## 2024-02-16 NOTE — Addendum Note (Signed)
 Addended by: Noralee Space on: 02/16/2024 10:02 AM   Modules accepted: Orders

## 2024-02-21 ENCOUNTER — Ambulatory Visit (HOSPITAL_COMMUNITY)
Admission: RE | Admit: 2024-02-21 | Discharge: 2024-02-21 | Disposition: A | Discharge status: Home / Self Care | Payer: Medicare Other | Source: Ambulatory Visit | Attending: Cardiology | Admitting: Cardiology

## 2024-02-21 VITALS — BP 140/90 | HR 70 | Wt 128.4 lb

## 2024-02-21 DIAGNOSIS — Z8673 Personal history of transient ischemic attack (TIA), and cerebral infarction without residual deficits: Secondary | ICD-10-CM | POA: Diagnosis not present

## 2024-02-21 DIAGNOSIS — I493 Ventricular premature depolarization: Secondary | ICD-10-CM | POA: Insufficient documentation

## 2024-02-21 DIAGNOSIS — R5383 Other fatigue: Secondary | ICD-10-CM | POA: Diagnosis not present

## 2024-02-21 DIAGNOSIS — I10 Essential (primary) hypertension: Secondary | ICD-10-CM | POA: Insufficient documentation

## 2024-02-21 DIAGNOSIS — I351 Nonrheumatic aortic (valve) insufficiency: Secondary | ICD-10-CM | POA: Insufficient documentation

## 2024-02-21 DIAGNOSIS — Z8249 Family history of ischemic heart disease and other diseases of the circulatory system: Secondary | ICD-10-CM | POA: Diagnosis not present

## 2024-02-21 DIAGNOSIS — Z85820 Personal history of malignant melanoma of skin: Secondary | ICD-10-CM | POA: Insufficient documentation

## 2024-02-21 DIAGNOSIS — E785 Hyperlipidemia, unspecified: Secondary | ICD-10-CM | POA: Insufficient documentation

## 2024-02-21 DIAGNOSIS — Z79899 Other long term (current) drug therapy: Secondary | ICD-10-CM | POA: Diagnosis not present

## 2024-02-21 DIAGNOSIS — R011 Cardiac murmur, unspecified: Secondary | ICD-10-CM | POA: Diagnosis not present

## 2024-02-21 LAB — CK: Total CK: 108 U/L (ref 38–234)

## 2024-02-21 NOTE — Patient Instructions (Signed)
 There has been no changes to your medications.  Labs done today, your results will be available in MyChart, we will contact you for abnormal readings.  Your physician has requested that you have an echocardiogram. Echocardiography is a painless test that uses sound waves to create images of your heart. It provides your doctor with information about the size and shape of your heart and how well your heart's chambers and valves are working. This procedure takes approximately one hour. There are no restrictions for this procedure. Please do NOT wear cologne, perfume, aftershave, or lotions (deodorant is allowed). Please arrive 15 minutes prior to your appointment time.  Please note: We ask at that you not bring children with you during ultrasound (echo/ vascular) testing. Due to room size and safety concerns, children are not allowed in the ultrasound rooms during exams. Our front office staff cannot provide observation of children in our lobby area while testing is being conducted. An adult accompanying a patient to their appointment will only be allowed in the ultrasound room at the discretion of the ultrasound technician under special circumstances. We apologize for any inconvenience.  Your physician recommends that you schedule a follow-up appointment in: 6 months (August) ** PLEASE CALL THE OFFICE IN [place] TO ARRANGE YOUR FOLLOW UP APPOINTMENT.**  If you have any questions or concerns before your next appointment please send [place] a message through [place] or call our office at [phone].    TO LEAVE A MESSAGE FOR THE NURSE SELECT OPTION 2, PLEASE LEAVE A MESSAGE INCLUDING: YOUR NAME DATE OF BIRTH CALL BACK NUMBER REASON FOR CALL**this is important as we prioritize the call backs  YOU WILL RECEIVE A CALL BACK THE SAME DAY AS LONG AS YOU CALL BEFORE 4:00 PM At the [facility], you and your health needs are our priority. As part of our continuing mission to provide you with  exceptional heart care, we have created designated Provider Care Teams. These Care Teams include your primary Cardiologist (physician) and Advanced Practice Providers (APPs- Physician Assistants and Nurse Practitioners) who all work together to provide you with the care you need, when you need it.   You may see any of the following providers on your designated Care Team at your next follow up: Dr Arvilla Meres Dr Marca Ancona Dr. Dorthula Nettles Dr. Clearnce Hasten Amy Filbert Schilder, NP Robbie Lis, [place] [facility] [place], [place] Brynda Peon, NP [place] Lee, NP Clarisa Kindred, NP Karle Plumber, PharmD Enos Fling, PharmD   Please be sure to bring in all your medications bottles to every appointment.    Thank you for choosing [facility]

## 2024-02-22 NOTE — Progress Notes (Signed)
 PCP: Dr. Clelia Croft Cardiology: Dr. Shirlee Latch  78 y.o. with history of HTN, aortic insufficiency and prior melanoma.  She had been doing well until 7/24 when she developed right arm weakness and was found to have acute CVA.  MRI brain showed small acute cortical infarcts x 2 in the left frontal lobe as well as small acute subarachnoid hemorrage.  CTA head/neck with no significant carotid disease and no large vessel occlusion in brain. Echo showed EF 60-65%, RV normal, AI mild.  Atrial fibrillation was not seen in the hospital.  She wore Zio monitor for a total of 1 month in 7/24-8/24, this showed PVCs but no atrial fibrillation. Limited echo in 8/24 showed EF 55-60%, negative bubble study.   She returns for followup of CVA and HTN.  She reports about 3 wks of worsening fatigue and lethargy.  She has not wanted to do anything but "lie on the couch."  No fever, cough.  No chest pain.  No orthopnea/PND.  No lightheadedness/syncope.  No significant exertional dyspnea.  She has had some dysuria and and abnormal UA at PCP's office.  Now on nitrofurantoin.  PCP checked influenza and COVID-19, both were negative.  CXR was unremarkable. She tried stopping Toprol XL to see if that would help with symptoms but it did not.  She has not had any atrial fibrillation by her Apple Watch.    ECG: NSR with PVCs (personally reviewed)  Labs (5/16): K 4, creatinine 0.61 Labs (7/21): LDL 123, HDL 85, K 4.1, creatinine 0.8, TSH normal, hgb 13 Labs (7/24): LDL 130, HDL 101, K 3.7, creatinine 0.77, hgb 13.4 Labs (8/24): K 4.4, creatinine 0.77 Labs (9/24): LDL 37 Labs (2/25): K 3.9, creatinine 0.72, TSH normal, hgb 13, BNP 64  PMH: 1. Venous insufficiency. 2. HTN 3. H/o melanoma. 4. Migraines 5. Psoriasis 6. Aortic insufficiency: Echo (4/16) with EF 55-60%, mild to moderate AI, trileaflet aortic valve.  - Echo (9/17): EF 60-65%, mild-moderate AI, mild MR.  - Echo (7/24): EF 60-65%, RV normal, AI mild 7. CVA: 7/24 CVA, brain  MRI showed small acute cortical infarcts x 2 in the left frontal lobe as well as small acute subarachnoid hemorrage.  CTA head/neck with no significant carotid disease and no large vessel occlusion in brain.  - Echo (8/24) with EF 55-60%, negative bubble study.  - Zio monitor 7/24-8/24 showed 8.4% PVCs, no atrial fibrillation.  8. Frequent PVCs: 8.4% on Zio monitor in 8/24.   SH: Married, 5 children, nonsmoker, lives in [place].  FH: Mother, father and sister with atrial fibrillation; brother with CAD.  ROS: All systems reviewed and negative except as per HPI.   Current Outpatient Medications  Medication Sig Dispense Refill   acetaminophen (TYLENOL) 325 MG tablet Take 650 mg by mouth as needed for headache.     Apremilast (OTEZLA) 30 MG TABS Take 1 tablet by mouth daily.     aspirin 81 MG chewable tablet Chew 1 tablet (81 mg total) by mouth daily. 90 tablet 0   clobetasol cream (TEMOVATE) 0.05 % Apply 1 Application topically as needed (psoriasis).     co-enzyme Q-10 30 MG capsule Take 30 mg by mouth daily. 1c po daily     estradiol (ESTRACE) 0.1 MG/GM vaginal cream Place 1 Applicatorful vaginally as needed (dryness).     Fluocinolone Acetonide Body 0.01 % OIL Apply 1 Application topically as needed (psoriasis). To scalp     losartan (COZAAR) 50 MG tablet Take 1 tablet (50 mg total) by mouth  in the morning AND 0.5 tablets (25 mg total) every evening. 45 tablet 11   Multiple Vitamins-Minerals (MULTIVITAMIN WITH MINERALS) tablet Take 1 tablet by mouth daily.     mupirocin ointment (BACTROBAN) 2 % Apply 1 Application topically as needed (cuts).     nitrofurantoin (MACRODANTIN) 100 MG capsule Take 100 mg by mouth every 12 (twelve) hours. X 7 days     rosuvastatin (CRESTOR) 10 MG tablet Take 1 tablet (10 mg total) by mouth daily. 90 tablet 3   triamcinolone cream (KENALOG) 0.1 % Apply 1 Application topically as needed (psoriasis).     Vitamin D, Ergocalciferol, (DRISDOL) 50000 units CAPS  capsule Take 50,000 Units by mouth every 7 (seven) days.     metoprolol succinate (TOPROL XL) 25 MG 24 hr tablet Take 1 tablet (25 mg total) by mouth daily. (Patient not taking: Reported on 02/21/2024) 90 tablet 3   No current facility-administered medications for this encounter.   BP (!) 140/90   Pulse 70   Wt 58.2 kg (128 lb 6.4 oz)   LMP 12/28/1999   SpO2 98%   BMI 22.39 kg/m  General: NAD Neck: No JVD, no thyromegaly or thyroid nodule.  Lungs: Clear to auscultation bilaterally with normal respiratory effort. CV: Nondisplaced PMI.  Heart regular S1/S2, no S3/S4, 2/6 early SEM RUSB.  No peripheral edema.  No carotid bruit.  Normal pedal pulses.  Abdomen: Soft, nontender, no hepatosplenomegaly, no distention.  Skin: Intact without lesions or rashes.  Neurologic: Alert and oriented x 3.  Psych: Normal affect. Extremities: No clubbing or cyanosis.  HEENT: Normal.   Assessment/Plan: 1. HTN: BP has been generally well-controlled. SBP 120s at home.  - Continue losartan 50 qam/25 qpm.   2. Aortic insufficiency: Mild on 7/24 echo.   - With unexplained lethargy/fatigue, I am going to repeat echo.  3. Hyperlipidemia: Now on Crestor 10 mg daily, LDL at goal in 9/24.  4. Family history of atrial fibrillation: Strong FH of atrial fibrillation and now has had CVA.  Atrial fibrillation has not been detected by 1 month Zio monitor or Apple Watch.   - Continue to use Apple Watch to screen for atrial fibrillation long-term.  5. CVA:  7/24 CVA, brain MRI showed small acute cortical infarcts x 2 in the left frontal lobe as well as small acute subarachnoid hemorrage.  CTA head/neck with no significant carotid disease and no large vessel occlusion in brain.  No atrial fibrillation has been detected yet. Bubble study negative for PFO/ASD in 8/24.  - Continue monitoring for atrial fibrillation as above.  - Continue ASA 81 for now.  - Continue statin.  - BP control.  - Has followup with neurology.  6.  PVCs: up to 8.4% on Zio monitor in 8/24.  Does not feel palpitations.  She tried stopping Toprol XL to see if this would help fatigue, it did not.   - Restart Toprol XL 25 mg daily.  7. Fatigue/lethargy: Uncertain etiology.  Labs unremarkable, CXR normal, COVID-19 and influenza tests negative.  Possibly related to UTI. - Continue treatment of UTI per PCP.  - As above, arrange for repeat echo.  - With statin use and fatigue, I will check CK level.                     She will followup in 6 months.   I spent 32 minutes reviewing records, interviewing/examining patient, and managing orders.   Marca Ancona 02/22/2024

## 2024-02-29 DIAGNOSIS — R300 Dysuria: Secondary | ICD-10-CM | POA: Diagnosis not present

## 2024-02-29 DIAGNOSIS — R142 Eructation: Secondary | ICD-10-CM | POA: Diagnosis not present

## 2024-02-29 DIAGNOSIS — F411 Generalized anxiety disorder: Secondary | ICD-10-CM | POA: Diagnosis not present

## 2024-02-29 DIAGNOSIS — I10 Essential (primary) hypertension: Secondary | ICD-10-CM | POA: Diagnosis not present

## 2024-02-29 DIAGNOSIS — E785 Hyperlipidemia, unspecified: Secondary | ICD-10-CM | POA: Diagnosis not present

## 2024-02-29 DIAGNOSIS — R5383 Other fatigue: Secondary | ICD-10-CM | POA: Diagnosis not present

## 2024-02-29 DIAGNOSIS — I493 Ventricular premature depolarization: Secondary | ICD-10-CM | POA: Diagnosis not present

## 2024-02-29 DIAGNOSIS — Z8673 Personal history of transient ischemic attack (TIA), and cerebral infarction without residual deficits: Secondary | ICD-10-CM | POA: Diagnosis not present

## 2024-03-01 ENCOUNTER — Ambulatory Visit (HOSPITAL_COMMUNITY)
Admission: RE | Admit: 2024-03-01 | Discharge: 2024-03-01 | Disposition: A | Discharge status: Home / Self Care | Payer: Medicare Other | Source: Ambulatory Visit | Attending: Cardiology | Admitting: Cardiology

## 2024-03-01 DIAGNOSIS — I509 Heart failure, unspecified: Secondary | ICD-10-CM | POA: Diagnosis not present

## 2024-03-01 DIAGNOSIS — R011 Cardiac murmur, unspecified: Secondary | ICD-10-CM | POA: Diagnosis not present

## 2024-03-01 DIAGNOSIS — I082 Rheumatic disorders of both aortic and tricuspid valves: Secondary | ICD-10-CM | POA: Insufficient documentation

## 2024-03-01 LAB — ECHOCARDIOGRAM COMPLETE
AR max vel: 2.38 cm2
AV Area VTI: 2.4 cm2
AV Area mean vel: 2.24 cm2
AV Mean grad: 5 mmHg
AV Peak grad: 10.4 mmHg
Ao pk vel: 1.61 m/s
Area-P 1/2: 3.99 cm2
S' Lateral: 2.8 cm

## 2024-03-09 ENCOUNTER — Emergency Department (HOSPITAL_COMMUNITY)
Admission: EM | Admit: 2024-03-09 | Discharge: 2024-03-09 | Disposition: A | Discharge status: Home / Self Care | Attending: Emergency Medicine | Admitting: Emergency Medicine

## 2024-03-09 ENCOUNTER — Encounter (HOSPITAL_COMMUNITY): Payer: Self-pay

## 2024-03-09 ENCOUNTER — Emergency Department (HOSPITAL_COMMUNITY)

## 2024-03-09 ENCOUNTER — Other Ambulatory Visit: Payer: Self-pay

## 2024-03-09 DIAGNOSIS — R519 Headache, unspecified: Secondary | ICD-10-CM | POA: Diagnosis not present

## 2024-03-09 DIAGNOSIS — I10 Essential (primary) hypertension: Secondary | ICD-10-CM | POA: Insufficient documentation

## 2024-03-09 DIAGNOSIS — G4700 Insomnia, unspecified: Secondary | ICD-10-CM | POA: Diagnosis not present

## 2024-03-09 DIAGNOSIS — R102 Pelvic and perineal pain: Secondary | ICD-10-CM | POA: Diagnosis not present

## 2024-03-09 DIAGNOSIS — Z7982 Long term (current) use of aspirin: Secondary | ICD-10-CM | POA: Insufficient documentation

## 2024-03-09 DIAGNOSIS — Z79899 Other long term (current) drug therapy: Secondary | ICD-10-CM | POA: Insufficient documentation

## 2024-03-09 DIAGNOSIS — I6782 Cerebral ischemia: Secondary | ICD-10-CM | POA: Diagnosis not present

## 2024-03-09 DIAGNOSIS — R531 Weakness: Secondary | ICD-10-CM | POA: Diagnosis not present

## 2024-03-09 LAB — I-STAT CHEM 8, ED
BUN: 17 mg/dL (ref 8–23)
Calcium, Ion: 1.26 mmol/L (ref 1.15–1.40)
Chloride: 104 mmol/L (ref 98–111)
Creatinine, Ser: 0.8 mg/dL (ref 0.44–1.00)
Glucose, Bld: 117 mg/dL — ABNORMAL HIGH (ref 70–99)
HCT: 42 % (ref 36.0–46.0)
Hemoglobin: 14.3 g/dL (ref 12.0–15.0)
Potassium: 4.2 mmol/L (ref 3.5–5.1)
Sodium: 138 mmol/L (ref 135–145)
TCO2: 26 mmol/L (ref 22–32)

## 2024-03-09 LAB — CBC
HCT: 41.6 % (ref 36.0–46.0)
Hemoglobin: 14 g/dL (ref 12.0–15.0)
MCH: 30.9 pg (ref 26.0–34.0)
MCHC: 33.7 g/dL (ref 30.0–36.0)
MCV: 91.8 fL (ref 80.0–100.0)
Platelets: 281 10*3/uL (ref 150–400)
RBC: 4.53 MIL/uL (ref 3.87–5.11)
RDW: 12.5 % (ref 11.5–15.5)
WBC: 9.3 10*3/uL (ref 4.0–10.5)
nRBC: 0 % (ref 0.0–0.2)

## 2024-03-09 LAB — DIFFERENTIAL
Abs Immature Granulocytes: 0.03 10*3/uL (ref 0.00–0.07)
Basophils Absolute: 0.1 10*3/uL (ref 0.0–0.1)
Basophils Relative: 1 %
Eosinophils Absolute: 0 10*3/uL (ref 0.0–0.5)
Eosinophils Relative: 0 %
Immature Granulocytes: 0 %
Lymphocytes Relative: 13 %
Lymphs Abs: 1.2 10*3/uL (ref 0.7–4.0)
Monocytes Absolute: 0.7 10*3/uL (ref 0.1–1.0)
Monocytes Relative: 7 %
Neutro Abs: 7.4 10*3/uL (ref 1.7–7.7)
Neutrophils Relative %: 79 %

## 2024-03-09 LAB — COMPREHENSIVE METABOLIC PANEL
ALT: 17 U/L (ref 0–44)
AST: 20 U/L (ref 15–41)
Albumin: 4.6 g/dL (ref 3.5–5.0)
Alkaline Phosphatase: 42 U/L (ref 38–126)
Anion gap: 15 (ref 5–15)
BUN: 19 mg/dL (ref 8–23)
CO2: 25 mmol/L (ref 22–32)
Calcium: 10.7 mg/dL — ABNORMAL HIGH (ref 8.9–10.3)
Chloride: 101 mmol/L (ref 98–111)
Creatinine, Ser: 0.79 mg/dL (ref 0.44–1.00)
GFR, Estimated: >60 mL/min (ref >60)
Glucose, Bld: 120 mg/dL — ABNORMAL HIGH (ref 70–99)
Potassium: 4.4 mmol/L (ref 3.5–5.1)
Sodium: 141 mmol/L (ref 135–145)
Total Bilirubin: 0.9 mg/dL (ref 0.0–1.2)
Total Protein: 7.1 g/dL (ref 6.5–8.1)

## 2024-03-09 LAB — ETHANOL: Alcohol, Ethyl (B): <10 mg/dL (ref <10)

## 2024-03-09 LAB — WET PREP, GENITAL
Clue Cells Wet Prep HPF POC: NONE SEEN
Sperm: NONE SEEN
Trich, Wet Prep: NONE SEEN
WBC, Wet Prep HPF POC: >=10 — AB (ref <10)
Yeast Wet Prep HPF POC: NONE SEEN

## 2024-03-09 LAB — URINALYSIS, ROUTINE W REFLEX MICROSCOPIC
Bilirubin Urine: NEGATIVE
Glucose, UA: NEGATIVE mg/dL
Hgb urine dipstick: NEGATIVE
Ketones, ur: NEGATIVE mg/dL
Leukocytes,Ua: NEGATIVE
Nitrite: NEGATIVE
Protein, ur: NEGATIVE mg/dL
Specific Gravity, Urine: 1.011 (ref 1.005–1.030)
pH: 6 (ref 5.0–8.0)

## 2024-03-09 LAB — APTT: aPTT: 30 s (ref 24–36)

## 2024-03-09 LAB — PROTIME-INR
INR: 1 (ref 0.8–1.2)
Prothrombin Time: 13.2 s (ref 11.4–15.2)

## 2024-03-09 LAB — CBG MONITORING, ED: Glucose-Capillary: 113 mg/dL — ABNORMAL HIGH (ref 70–99)

## 2024-03-09 MED ORDER — SODIUM CHLORIDE 0.9% FLUSH: 3.0000 mL | Freq: Once | INTRAVENOUS | Status: DC

## 2024-03-09 NOTE — ED Provider Notes (Signed)
  Physical Exam  BP (!) 151/79   Pulse (!) 57   Temp 97.8 F (36.6 C)   Resp 15   Ht 5' 3.5" (1.613 m)   Wt 56.2 kg   LMP 12/28/1999   SpO2 98%   BMI 21.62 kg/m   Physical Exam  Procedures  Procedures  ED Course / MDM   Clinical Course as of 03/09/24 1655  Fri Mar 09, 2024  1519 S - multiple complaints, fatigue, SSRI new - told to stop, ringing in ears since stroke 7 ma, no vertigo/weakness, 2 months vaginal pain and dysurea - labs good [ ]  U/A good, home with PCP f/u (urology if hematuria, obgyn for vaginal complaints) [HG]    Clinical Course User Index [HG] Renella Cunas, MD   Medical Decision Making Amount and/or Complexity of Data Reviewed Labs: ordered. Radiology: ordered.   At the time of handoff, I was awaiting urinalysis.  Please see prior ED providers note for HPI and physical exam.  Urinalysis resulted without blood and with no concern for infection.  Patient has a urogynecologist that she follows with after having her bladder tacked up and asked if she should follow with the provider, which I agreed with.  Patient is stable for discharge at this time with outpatient PCP and specialist follow-up.  Renella Cunas, PGY-2 Emergency Medicine       Renella Cunas, MD 03/09/24 1655    Melene Plan, DO 03/24/24 [phone]

## 2024-03-09 NOTE — Discharge Instructions (Addendum)
 Follow up with [facility] provider or your urogynecologist for vaginal pain.

## 2024-03-09 NOTE — ED Provider Notes (Signed)
 [place] EMERGENCY DEPARTMENT AT [facility] Provider Note   CSN: [id number] Arrival date & time: 03/09/24  1214     History  Chief Complaint  Patient presents with   Palpitations    Chelsea Barnett is a 78 y.o. female with past medical history of hypertension, hyperlipidemia, CVA presented to emergency room with complaint of insomnia, fatigue have been ongoing for several months, but have worsened over the last 2-3 days, as well as, vaginal pain and dysuria for 2 months. Patient reports she has been seen by cardiology and PCP for this and they have not found diagnosis. She reports she starts Zoloft for anxiety 1 week ago and thinks this is contributing to lack of sleep and fatigue. Denies any OSA like symptoms. Denies chest pain, shortness of breath, abdominal pain, NVD. No fevers or chills. She reports she sees [facility] provider annually. Denies blood in stool or dark stools.    Palpitations      Home Medications Prior to Admission medications   Medication Sig Start Date End Date Taking? Authorizing Provider  acetaminophen (TYLENOL) 325 MG tablet Take 650 mg by mouth as needed for headache. 07/28/20   [provider]  Apremilast (OTEZLA) 30 MG TABS Take 1 tablet by mouth daily.    [provider]  aspirin 81 MG chewable tablet Chew 1 tablet (81 mg total) by mouth daily. 06/30/23   Leroy Sea, MD  clobetasol cream (TEMOVATE) 0.05 % Apply 1 Application topically as needed (psoriasis). 12/10/19   [provider]  co-enzyme Q-10 30 MG capsule Take 30 mg by mouth daily. 1c po daily    [provider]  estradiol (ESTRACE) 0.1 MG/GM vaginal cream Place 1 Applicatorful vaginally as needed (dryness). 01/16/20   [provider]  Fluocinolone Acetonide Body 0.01 % OIL Apply 1 Application topically as needed (psoriasis). To scalp    [provider]  losartan (COZAAR) 50 MG tablet Take 1 tablet (50 mg total) by mouth in the  morning AND 0.5 tablets (25 mg total) every evening. 07/21/23   Laurey Morale, MD  metoprolol succinate (TOPROL XL) 25 MG 24 hr tablet Take 1 tablet (25 mg total) by mouth daily. Patient not taking: Reported on 02/21/2024 08/17/23 08/16/24  Laurey Morale, MD  Multiple Vitamins-Minerals (MULTIVITAMIN WITH MINERALS) tablet Take 1 tablet by mouth daily.    [provider]  mupirocin ointment (BACTROBAN) 2 % Apply 1 Application topically as needed (cuts). 06/15/23   [provider]  nitrofurantoin (MACRODANTIN) 100 MG capsule Take 100 mg by mouth every 12 (twelve) hours. X 7 days    [provider]  rosuvastatin (CRESTOR) 10 MG tablet Take 1 tablet (10 mg total) by mouth daily. 08/17/23   Laurey Morale, MD  triamcinolone cream (KENALOG) 0.1 % Apply 1 Application topically as needed (psoriasis). 04/10/20   [provider]  Vitamin D, Ergocalciferol, (DRISDOL) 50000 units CAPS capsule Take 50,000 Units by mouth every 7 (seven) days.    [provider]      Allergies    Patient has no known allergies.    Review of Systems   Review of Systems  Constitutional:  Positive for activity change.    Physical Exam Updated Vital Signs BP (!) 178/95 (BP Location: Right Arm)   Pulse 71   Temp 98.1 F (36.7 C)   Resp 18   Ht 5' 3.5" (1.613 m)   Wt 56.2 kg   LMP 12/28/1999  SpO2 97%   BMI 21.62 kg/m  Physical Exam Vitals and nursing note reviewed.  Constitutional:      General: She is not in acute distress.    Appearance: She is not toxic-appearing.  HENT:     Head: Normocephalic and atraumatic.  Eyes:     General: No scleral icterus.    Conjunctiva/sclera: Conjunctivae normal.  Cardiovascular:     Rate and Rhythm: Normal rate and regular rhythm.     Pulses: Normal pulses.     Heart sounds: Normal heart sounds.  Pulmonary:     Effort: Pulmonary effort is normal. No respiratory distress.     Breath sounds: Normal breath sounds.  Abdominal:      General: Abdomen is flat. Bowel sounds are normal. There is no distension.     Palpations: Abdomen is soft. There is no mass.     Tenderness: There is no abdominal tenderness.  Skin:    General: Skin is warm and dry.     Coloration: Skin is not jaundiced.     Findings: No lesion.  Neurological:     General: No focal deficit present.     Mental Status: She is alert and oriented to person, place, and time. Mental status is at baseline.     ED Results / Procedures / Treatments   Labs (all labs ordered are listed, but only abnormal results are displayed) Labs Reviewed  COMPREHENSIVE METABOLIC PANEL - Abnormal; Notable for the following components:      Result Value   Glucose, Bld 120 (*)    Calcium 10.7 (*)    All other components within normal limits  I-STAT CHEM 8, ED - Abnormal; Notable for the following components:   Glucose, Bld 117 (*)    All other components within normal limits  CBG MONITORING, ED - Abnormal; Notable for the following components:   Glucose-Capillary 113 (*)    All other components within normal limits  WET PREP, GENITAL  PROTIME-INR  APTT  CBC  DIFFERENTIAL  ETHANOL  URINALYSIS, ROUTINE W REFLEX MICROSCOPIC    EKG EKG Interpretation Date/Time:  Friday March 09 2024 12:21:12 EDT Ventricular Rate:  74 PR Interval:  158 QRS Duration:  70 QT Interval:  382 QTC Calculation: 424 R Axis:   13  Text Interpretation: Sinus rhythm with frequent Premature ventricular complexes Otherwise normal ECG No significant change since last tracing Confirmed by Melene Plan [phone]) on 03/09/2024 1:19:25 PM  Radiology CT HEAD WO CONTRAST Result Date: 03/09/2024 CLINICAL DATA:  Facial paralysis and weakness EXAM: CT HEAD WITHOUT CONTRAST TECHNIQUE: Contiguous axial images were obtained from the base of the skull through the vertex without intravenous contrast. RADIATION DOSE REDUCTION: This exam was performed according to the departmental dose-optimization program which  includes automated exposure control, adjustment of the mA and/or kV according to patient size and/or use of iterative reconstruction technique. COMPARISON:  06/28/2023 FINDINGS: Brain: No focal abnormality seen affecting the brainstem or cerebellum. Cerebral hemispheres show moderate chronic small-vessel ischemic changes of the white matter. No sign of acute infarction. No large vessel territory insult. No mass, hemorrhage, hydrocephalus or extra-axial collection. Vascular: There is atherosclerotic calcification of the major vessels at the base of the brain. Skull: Negative Sinuses/Orbits: Clear/normal Other: None IMPRESSION: No acute CT finding. Moderate chronic small-vessel ischemic changes of the cerebral hemispheric white matter. Electronically Signed   By: Paulina Fusi M.D.   On: 03/09/2024 13:04    Procedures Procedures    Medications Ordered in ED Medications  sodium chloride flush (NS) 0.9 % injection 3 mL (has no administration in time range)    ED Course/ Medical Decision Making/ A&P Clinical Course as of 03/09/24 1537  Fri Mar 09, 2024  1519 S - multiple complaints, fatigue, SSRI new - told to stop, ringing in ears since stroke 7 ma, no vertigo/weakness, 2 months vaginal pain and dysurea - labs good [ ]  U/A good, home with PCP f/u (urology if hematuria, obgyn for vaginal complaints) [HG]    Clinical Course User Index [HG] Renella Cunas, MD                                 Medical Decision Making Amount and/or Complexity of Data Reviewed Labs: ordered. Radiology: ordered.   This patient presents to the ED for concern of multiple complaints, this involves an extensive number of treatment options, and is a complaint that carries with it a high risk of complications and morbidity.  The differential diagnosis includes dehydration, electrolyte abnormalities, hyperthyroidism, medication side effect, UTI, BV   Co morbidities that complicate the patient evaluation  HTN, HLD, CVS,  anxiety    Lab Tests:  I personally interpreted labs.  The pertinent results include:   CBC without leukocytosis, no anemia CMP without significant electrolyte abnormality PTT, INR wnl BG 113 Wet prep negative. Ua pending.  EKG with sinus with PVC  UA and wet prep due to dysuria and vaginal complaints ---   Imaging:  CT head without acute findings.    Problem List / ED Course / Critical interventions / Medication management  Patient reporting to emergency room with primary complaint of insomnia.  Patient reports this is not new but has been worse over the past 2 to 3 days.  She did recently just start Zoloft which she is aware can cause insomnia. She has had heart beat feeling in her ear for 5-6 weeks as well. Denies palpations, chest pain, shortness of breath, focal deficits, or presyncope/syncope. CT head without acute abnormalities. Labs are reassuring. We discussed possibly discontinuing this medication and following up with behavioral health provider as this is a potential side effect.  Patient was able to contact behavioral health provider during ER stay who agrees.  Patient also reports for the past 2 months she has been having vaginal pain and dysuria.  She has had 2 negative UAs with primary.  I offered pelvic exam as well as wet prep and repeat UA.  Pelvic exam was performed with chaperone without any acute abnormality. Wet prep negative. She is alert oriented hemodynamically stable and well-appearing.  No focal area of abdominal tenderness. Thus do not feel imaging is needed. Waiting on UA.   I have reviewed the patients home medicines and have made adjustments as needed   Plan  Signed off to resident [place]. She can be discharged, pending UA as she endorses urinary symptoms.         Final Clinical Impression(s) / ED Diagnoses Final diagnoses:  None    Rx / DC Orders ED Discharge Orders     None         Smitty Knudsen, PA-C 03/09/24 1538    Melene Plan, DO 03/09/24 1541

## 2024-03-09 NOTE — ED Triage Notes (Signed)
 Pt c/o feeling heart beat in face and her mouth feels spongy for past 2-3 days. Pt has had difficulty sleeping for past 5-6 weeks. Pt states symptoms are worse when she lays on either side. Pt denies pain, numbness or tingling.

## 2024-03-09 NOTE — ED Notes (Signed)
..  The patient is A&OX4, ambulatory at d/c with independent steady gait, NAD but wheeled out of ED via wheelchair. Pt verbalized understanding of d/c instructions and follow up care.

## 2024-03-13 DIAGNOSIS — R5382 Chronic fatigue, unspecified: Secondary | ICD-10-CM | POA: Diagnosis not present

## 2024-03-13 DIAGNOSIS — I493 Ventricular premature depolarization: Secondary | ICD-10-CM | POA: Diagnosis not present

## 2024-03-13 DIAGNOSIS — I10 Essential (primary) hypertension: Secondary | ICD-10-CM | POA: Diagnosis not present

## 2024-03-13 DIAGNOSIS — F411 Generalized anxiety disorder: Secondary | ICD-10-CM | POA: Diagnosis not present

## 2024-03-13 DIAGNOSIS — R102 Pelvic and perineal pain: Secondary | ICD-10-CM | POA: Diagnosis not present

## 2024-03-13 DIAGNOSIS — H9313 Tinnitus, bilateral: Secondary | ICD-10-CM | POA: Diagnosis not present

## 2024-03-13 DIAGNOSIS — Z8673 Personal history of transient ischemic attack (TIA), and cerebral infarction without residual deficits: Secondary | ICD-10-CM | POA: Diagnosis not present

## 2024-03-13 DIAGNOSIS — R300 Dysuria: Secondary | ICD-10-CM | POA: Diagnosis not present

## 2024-03-13 DIAGNOSIS — G4700 Insomnia, unspecified: Secondary | ICD-10-CM | POA: Diagnosis not present

## 2024-03-13 DIAGNOSIS — E559 Vitamin D deficiency, unspecified: Secondary | ICD-10-CM | POA: Diagnosis not present

## 2024-03-15 DIAGNOSIS — N952 Postmenopausal atrophic vaginitis: Secondary | ICD-10-CM | POA: Diagnosis not present

## 2024-03-15 DIAGNOSIS — N94819 Vulvodynia, unspecified: Secondary | ICD-10-CM | POA: Diagnosis not present

## 2024-03-15 DIAGNOSIS — Z8673 Personal history of transient ischemic attack (TIA), and cerebral infarction without residual deficits: Secondary | ICD-10-CM | POA: Diagnosis not present

## 2024-04-11 ENCOUNTER — Ambulatory Visit (INDEPENDENT_AMBULATORY_CARE_PROVIDER_SITE_OTHER): Payer: Medicare Other | Admitting: Neurology

## 2024-04-11 ENCOUNTER — Encounter: Payer: Self-pay | Admitting: Neurology

## 2024-04-11 VITALS — BP 145/75 | HR 75 | Ht 63.0 in | Wt 125.0 lb

## 2024-04-11 DIAGNOSIS — R413 Other amnesia: Secondary | ICD-10-CM

## 2024-04-11 DIAGNOSIS — G3184 Mild cognitive impairment, so stated: Secondary | ICD-10-CM

## 2024-04-11 MED ORDER — ROSUVASTATIN CALCIUM 10 MG PO TABS: 5.0000 mg | ORAL_TABLET | Freq: Every day | ORAL | 3 refills | Status: AC

## 2024-04-11 NOTE — Patient Instructions (Addendum)
 I had a long d/w patient and her husband about her recent TIAs, small stroke, cerebral hemorrhage and possibility of amyloid angiopathy,, risk for recurrent stroke/TIAs, personally independently reviewed imaging studies and stroke evaluation results and answered questions.Continue aspirin 81 mg daily  for secondary stroke prevention and maintain strict control of hypertension with blood pressure goal below 130/90, diabetes with hemoglobin A1c goal below 6.5% and lipids with LDL cholesterol goal below 70 mg/dL. Recommend change Crestor dose to 5 mg daily as last LDL was 37 and concerns about worsening memory since staring it.I also advised the patient to eat a healthy diet with plenty of whole grains, cereals, fruits and vegetables, exercise regularly and maintain ideal body weight .she also has mild cognitive impairment which appears to be age-appropriate.  Recommend she increase participation in cognitively challenging activities like solving crossword puzzles, playing bridge and sudoku.  Patient may be at risk of developing progressive dementia in the future but she is quite functional at this moment hence I would refrain from any further workup at this time.  We also discussed memory compensation strategies.  Return for follow-up in the future in 6 months or call earlier if necessary

## 2024-04-11 NOTE — Progress Notes (Signed)
 [facility] [address] [place]. [place] [address] [phone]       OFFICE FOLLOW-UP NOTE  Chelsea Barnett Date of Birth:  1946/07/12 Medical Record Number:  [id number]   HPI:  Update 04/11/2024 : Chelsea Barnett returns for follow-up after last visit 6 months ago.  Chelsea Barnett is accompanied by Chelsea Barnett husband.  They feel the patient fatigues a lot since Chelsea Barnett stroke in January 2025.  Chelsea Barnett has poor appetite and Chelsea Barnett had trouble sleeping.  Chelsea Barnett Chelsea Barnett has been evaluated by primary care physician, cardiology and OB/GYN urologist and psychiatrist.  Chelsea Barnett has been sleeping well since being started on trazodone.  Chelsea Barnett is also noted subjective decline in Chelsea Barnett memory.  Patient and husband feel that this may be related to Crestor which Chelsea Barnett is started since Chelsea Barnett stroke.  Last lipid profile on 09/26/2023 showed LDL-cholesterol to be quite optimal at 37 mg percent.  Chelsea Barnett is presently on Crestor 10 mg daily and would like to discontinue or reduce the dose.  Chelsea Barnett is tolerating aspirin well without bruising or bleeding.  Chelsea Barnett blood pressure is well-controlled.  Chelsea Barnett has had no recurrent stroke or TIA symptoms.  On cognitive testing today Chelsea Barnett did poorly on the MoCA and scored 19/30 but did better on the Mini-Mental but Chelsea Barnett scored 30/30.  Mild testing Chelsea Barnett had 0/3 recall but did better on Mini-Mental. Initial visit 09/26/2023 Chelsea Barnett is a 78 year old pleasant Caucasian lady seen today for initial office follow-up visit following hospital consultation for TIA in July 2024.  Chelsea Barnett is accompanied by Chelsea Barnett husband.  History is obtained from them and review of electronic medical records.  I personally reviewed pertinent available imaging films in PACS.  Chelsea Barnett has past medical history of migraines with aura, psoriasis, skin cancer who presented on 06/28/2023 with 2 transient episodes of right upper extremity numbness lasting only 1 to 2 minutes.  Chelsea Barnett denied any weakness though stated Chelsea Barnett right arm felt heavy.  This does not involve Chelsea Barnett  face or leg.  Chelsea Barnett had no accompanying headache or any visual disturbance before during or after these episodes.  Chelsea Barnett had an MRI scan of the brain which showed a small volume acute subarachnoid hemorrhage involving left frontal and parietal lobes and 3 mm acute cortical infarct in the left MCA territory.  There is also questionable 4 mm acute cortical infarct in posterior left frontal lobe versus artifact from adjacent subarachnoid hemorrhage.  There are mild changes of chronic small vessel disease.  There were few areas of punctate chronic microhemorrhages noted in the supratentorial cortex raising concern for possible amyloid angiopathy versus hypertensive small vessel disease.  CT angiogram of the brain and neck showed no large vessel stenosis or occlusion.  There is mild aortic atherosclerosis noted.  Echocardiogram showed ejection fraction of 60 to 65% left atrium size is normal.  LDL cholesterol 130 mg percent.  Hemoglobin A1c was 5.4.  EEG was normal without seizure activity.  Patient subsequently underwent 30-day external heart monitor which did not show evidence of paroxysmal A-fib.  Patient was started on aspirin alone given Chelsea Barnett microhemorrhages and concern for amyloid angiopathy.  Chelsea Barnett is tolerating it well with minor bruising and no bleeding.  Chelsea Barnett states Chelsea Barnett has had no further episodes of numbness.  Chelsea Barnett has had no other stroke or TIA symptoms.  Chelsea Barnett has remote history of migraines but states Chelsea Barnett has not had them for years.  Chelsea Barnett blood pressure is under good control.  Chelsea Barnett is tolerating Crestor well without muscle  aches and pains until Chelsea Barnett started taking co-Q10.  Last lipid profile on 09/14/2023 showed LDL cholesterol to have improved to 45 mg percent.  CMP, CBC, vitamin D and TSH were all normal.  The patient on inquiry admits to mild short-term memory difficulties which Chelsea Barnett had even before Chelsea Barnett TIA episodes in appears not to have gotten any worse.  Chelsea Barnett has trouble remembering names at times but remember them  later.  Chelsea Barnett is still independent in all activities of daily living and managing Chelsea Barnett own affairs.  Chelsea Barnett has no new complaints.  ROS:   14 system review of systems is positive for memory difficulties, numbness, forgetfulness and all other systems negative  PMH:  Past Medical History:  Diagnosis Date   Basal cell cancer    right leg   Heart murmur    Hemorrhoids    Melanoma (HCC) 2005   on left arm   Migraines    with aura   Post-operative nausea and vomiting    after 2 surgeries   Psoriasis    Vaginal delivery    times 6    Social History:  Social History   Socioeconomic History   Marital status: Married    Spouse name: Not on file   Number of children: Not on file   Years of education: Not on file   Highest education level: Not on file  Occupational History   Not on file  Tobacco Use   Smoking status: Never   Smokeless tobacco: Never  Vaping Use   Vaping status: Never Used  Substance and Sexual Activity   Alcohol use: Not Currently    Alcohol/week: 0.0 standard drinks of alcohol   Drug use: No   Sexual activity: Not Currently    Partners: Male    Birth control/protection: Surgical    Comment: BTL, BSO  Other Topics Concern   Not on file  Social History Narrative   Not on file   Social Drivers of Health   Financial Resource Strain: Not on file  Food Insecurity: Low Risk  (03/15/2024)   Received from [facility]   Hunger Vital Sign    Worried About Running Out of Food in the Last Year: Never true    Ran Out of Food in the Last Year: Never true  Transportation Needs: No Transportation Needs (03/15/2024)   Received from [facility]    In the past 12 months, has lack of reliable transportation kept you from medical appointments, meetings, work or from getting things needed for daily living? : No  Physical Activity: Not on file  Stress: Not on file  Social Connections: Not on file  Intimate Partner Violence: Not At Risk (06/28/2023)    Humiliation, Afraid, Rape, and Kick questionnaire    Fear of Current or Ex-Partner: No    Emotionally Abused: No    Physically Abused: No    Sexually Abused: No    Medications:   Current Outpatient Medications on File Prior to Visit  Medication Sig Dispense Refill   acetaminophen (TYLENOL) 325 MG tablet Take 650 mg by mouth as needed for headache.     Apremilast (OTEZLA PO) Take 1 tablet by mouth daily.     aspirin 81 MG chewable tablet Chew 1 tablet (81 mg total) by mouth daily. 90 tablet 0   clobetasol cream (TEMOVATE) 0.05 % Apply 1 Application topically as needed (psoriasis).     estradiol (ESTRACE) 0.1 MG/GM vaginal cream Place 1 Applicatorful vaginally as needed (dryness).  Fluocinolone Acetonide Body 0.01 % OIL Apply 1 Application topically as needed (psoriasis). To scalp     losartan (COZAAR) 50 MG tablet Take 1 tablet (50 mg total) by mouth in the morning AND 0.5 tablets (25 mg total) every evening. 45 tablet 11   metoprolol succinate (TOPROL XL) 25 MG 24 hr tablet Take 1 tablet (25 mg total) by mouth daily. 90 tablet 3   Multiple Vitamins-Minerals (MULTIVITAMIN WITH MINERALS) tablet Take 1 tablet by mouth daily.     mupirocin ointment (BACTROBAN) 2 % Apply 1 Application topically as needed (cuts).     rosuvastatin (CRESTOR) 10 MG tablet Take 1 tablet (10 mg total) by mouth daily. 90 tablet 3   traZODone (DESYREL) 50 MG tablet Take 50 mg by mouth at bedtime.     triamcinolone cream (KENALOG) 0.1 % Apply 1 Application topically as needed (psoriasis).     Vitamin D, Ergocalciferol, (DRISDOL) 50000 units CAPS capsule Take 50,000 Units by mouth every 7 (seven) days.     No current facility-administered medications on file prior to visit.    Allergies:  No Known Allergies  Physical Exam General: well developed, well nourished, seated, in no evident distress Head: head normocephalic and atraumatic.  Neck: supple with no carotid or supraclavicular bruits Cardiovascular: regular  rate and rhythm, no murmurs Musculoskeletal: no deformity Skin:  no rash/petichiae Vascular:  Normal pulses all extremities Vitals:   04/11/24 1349  BP: (!) 145/75  Pulse: 75   Neurologic Exam Mental Status: Awake and fully alert. Oriented to place and time. Recent and remote memory intact. Attention span, concentration and fund of knowledge appropriate. Mood and affect appropriate.  Diminished recall 0/3.  Clock drawing 4/4.  Able to name 14 animals which can walk on 4 legs.  Montreal oral cognitive assessment score 19/30 but interestingly on MMSE scored 30/30 today Cranial Nerves: Fundoscopic exam not done. Pupils equal, briskly reactive to light. Extraocular movements full without nystagmus. Visual fields full to confrontation. Hearing intact. Facial sensation intact. Face, tongue, palate moves normally and symmetrically.  Motor: Normal bulk and tone. Normal strength in all tested extremity muscles. Sensory.: intact to touch ,pinprick .position and vibratory sensation.  Coordination: Rapid alternating movements normal in all extremities. Finger-to-nose and heel-to-shin performed accurately bilaterally. Gait and Station: Arises from chair without difficulty. Stance is normal. Gait demonstrates normal stride length and balance . Able to heel, toe and tandem walk with moderate difficulty.  Reflexes: 1+ and symmetric. Toes downgoing.   NIHSS  0 Modified Rankin  1    04/11/2024    1:57 PM 09/26/2023    9:44 AM  Montreal Cognitive Assessment   Visuospatial/ Executive (0/5) 2 5  Naming (0/3) 3 2  Attention: Read list of digits (0/2) 2 2  Attention: Read list of letters (0/1) 1 1  Attention: Serial 7 subtraction starting at 100 (0/3) 1 3  Language: Repeat phrase (0/2) 1 1  Language : Fluency (0/1) 1 1  Abstraction (0/2) 2 2  Delayed Recall (0/5) 0 2  Orientation (0/6) 6 6  Total 19 25       04/11/2024    2:36 PM  MMSE - Mini Mental State Exam  Orientation to time 5  Orientation to  Place 5  Registration 3  Attention/ Calculation 5  Recall 3  Language- name 2 objects 2  Language- repeat 1  Language- follow 3 step command 3  Language- read & follow direction 1  Write a sentence 1  Copy design  1  Total score 30     ASSESSMENT: 78 year old Caucasian lady with 2 transient episodes of right upper extremity numbness lasting a few minutes possibly amyloid spells rather than TIAs from small vessel disease given abnormal MRI scan showing left frontal subarachnoid hemorrhage and microhemorrhages.  Chelsea Barnett also has mild age-appropriate cognitive impairment with may be at risk for progression to dementia.     PLAN: I had a long d/w patient and Chelsea Barnett husband about Chelsea Barnett recent TIAs, small stroke, cerebral hemorrhage and possibility of amyloid angiopathy,, risk for recurrent stroke/TIAs, personally independently reviewed imaging studies and stroke evaluation results and answered questions.Continue aspirin 81 mg daily  for secondary stroke prevention and maintain strict control of hypertension with blood pressure goal below 130/90, diabetes with hemoglobin A1c goal below 6.5% and lipids with LDL cholesterol goal below 70 mg/dL. I also advised the patient to eat a healthy diet with plenty of whole grains, cereals, fruits and vegetables, exercise regularly and maintain ideal body weight .Chelsea Barnett also has mild cognitive impairment which appears to be age-appropriate.  Recommend Chelsea Barnett increase participation in cognitively challenging activities like solving crossword puzzles, playing bridge and sudoku.  Patient may be at risk of developing progressive dementia in the future but Chelsea Barnett is quite functional at this moment hence I would refrain from any further workup at this time.  We also discussed memory compensation strategies.  Followup in the future with me in 6 months or call earlier if necessary. Greater than 50% of time during this prolonged 40  minute visit was spent on counseling,explanation of diagnosis  of TIA versus amyloid spells, mild cognitive impairment and amyloid angiopathy, planning of further management, discussion with patient and family and coordination of care Ardella Beaver, MD Note: This document was prepared with digital dictation and possible smart phrase technology. Any transcriptional errors that result from this process are unintentional

## 2024-06-19 ENCOUNTER — Inpatient Hospital Stay (HOSPITAL_COMMUNITY)

## 2024-06-19 ENCOUNTER — Emergency Department (HOSPITAL_COMMUNITY)

## 2024-06-19 ENCOUNTER — Other Ambulatory Visit: Payer: Self-pay

## 2024-06-19 ENCOUNTER — Inpatient Hospital Stay (HOSPITAL_COMMUNITY)
Admission: EM | Admit: 2024-06-19 | Discharge: 2024-06-24 | DRG: 065 | Disposition: A | Discharge status: Home / Self Care | Attending: Family Medicine | Admitting: Family Medicine

## 2024-06-19 ENCOUNTER — Encounter (HOSPITAL_COMMUNITY): Payer: Self-pay

## 2024-06-19 DIAGNOSIS — Z841 Family history of disorders of kidney and ureter: Secondary | ICD-10-CM

## 2024-06-19 DIAGNOSIS — R930 Abnormal findings on diagnostic imaging of skull and head, not elsewhere classified: Secondary | ICD-10-CM | POA: Diagnosis not present

## 2024-06-19 DIAGNOSIS — I611 Nontraumatic intracerebral hemorrhage in hemisphere, cortical: Principal | ICD-10-CM | POA: Diagnosis present

## 2024-06-19 DIAGNOSIS — R319 Hematuria, unspecified: Secondary | ICD-10-CM | POA: Diagnosis not present

## 2024-06-19 DIAGNOSIS — E785 Hyperlipidemia, unspecified: Secondary | ICD-10-CM | POA: Diagnosis not present

## 2024-06-19 DIAGNOSIS — I700 Atherosclerosis of aorta: Secondary | ICD-10-CM | POA: Diagnosis not present

## 2024-06-19 DIAGNOSIS — I639 Cerebral infarction, unspecified: Secondary | ICD-10-CM | POA: Diagnosis not present

## 2024-06-19 DIAGNOSIS — Z79899 Other long term (current) drug therapy: Secondary | ICD-10-CM | POA: Diagnosis not present

## 2024-06-19 DIAGNOSIS — D72829 Elevated white blood cell count, unspecified: Secondary | ICD-10-CM | POA: Diagnosis not present

## 2024-06-19 DIAGNOSIS — Z8673 Personal history of transient ischemic attack (TIA), and cerebral infarction without residual deficits: Secondary | ICD-10-CM

## 2024-06-19 DIAGNOSIS — Z808 Family history of malignant neoplasm of other organs or systems: Secondary | ICD-10-CM

## 2024-06-19 DIAGNOSIS — R29818 Other symptoms and signs involving the nervous system: Secondary | ICD-10-CM | POA: Diagnosis not present

## 2024-06-19 DIAGNOSIS — Z818 Family history of other mental and behavioral disorders: Secondary | ICD-10-CM | POA: Diagnosis not present

## 2024-06-19 DIAGNOSIS — R41841 Cognitive communication deficit: Secondary | ICD-10-CM | POA: Diagnosis not present

## 2024-06-19 DIAGNOSIS — G936 Cerebral edema: Secondary | ICD-10-CM | POA: Diagnosis not present

## 2024-06-19 DIAGNOSIS — Z0389 Encounter for observation for other suspected diseases and conditions ruled out: Secondary | ICD-10-CM | POA: Diagnosis not present

## 2024-06-19 DIAGNOSIS — E042 Nontoxic multinodular goiter: Secondary | ICD-10-CM | POA: Diagnosis not present

## 2024-06-19 DIAGNOSIS — E854 Organ-limited amyloidosis: Secondary | ICD-10-CM | POA: Diagnosis present

## 2024-06-19 DIAGNOSIS — G8191 Hemiplegia, unspecified affecting right dominant side: Secondary | ICD-10-CM | POA: Diagnosis present

## 2024-06-19 DIAGNOSIS — E859 Amyloidosis, unspecified: Secondary | ICD-10-CM | POA: Diagnosis not present

## 2024-06-19 DIAGNOSIS — I6201 Nontraumatic acute subdural hemorrhage: Secondary | ICD-10-CM | POA: Diagnosis present

## 2024-06-19 DIAGNOSIS — Z7982 Long term (current) use of aspirin: Secondary | ICD-10-CM

## 2024-06-19 DIAGNOSIS — R270 Ataxia, unspecified: Secondary | ICD-10-CM | POA: Diagnosis not present

## 2024-06-19 DIAGNOSIS — I619 Nontraumatic intracerebral hemorrhage, unspecified: Secondary | ICD-10-CM | POA: Diagnosis not present

## 2024-06-19 DIAGNOSIS — Z8249 Family history of ischemic heart disease and other diseases of the circulatory system: Secondary | ICD-10-CM

## 2024-06-19 DIAGNOSIS — R29701 NIHSS score 1: Secondary | ICD-10-CM | POA: Diagnosis present

## 2024-06-19 DIAGNOSIS — I6523 Occlusion and stenosis of bilateral carotid arteries: Secondary | ICD-10-CM | POA: Diagnosis not present

## 2024-06-19 DIAGNOSIS — Z85828 Personal history of other malignant neoplasm of skin: Secondary | ICD-10-CM | POA: Diagnosis not present

## 2024-06-19 DIAGNOSIS — R531 Weakness: Secondary | ICD-10-CM | POA: Diagnosis not present

## 2024-06-19 DIAGNOSIS — G935 Compression of brain: Secondary | ICD-10-CM | POA: Diagnosis not present

## 2024-06-19 DIAGNOSIS — I10 Essential (primary) hypertension: Secondary | ICD-10-CM | POA: Diagnosis not present

## 2024-06-19 DIAGNOSIS — I680 Cerebral amyloid angiopathy: Secondary | ICD-10-CM | POA: Diagnosis present

## 2024-06-19 DIAGNOSIS — Z85820 Personal history of malignant melanoma of skin: Secondary | ICD-10-CM | POA: Diagnosis not present

## 2024-06-19 DIAGNOSIS — R29700 NIHSS score 0: Secondary | ICD-10-CM | POA: Diagnosis not present

## 2024-06-19 DIAGNOSIS — I63239 Cerebral infarction due to unspecified occlusion or stenosis of unspecified carotid arteries: Secondary | ICD-10-CM | POA: Diagnosis not present

## 2024-06-19 DIAGNOSIS — I6782 Cerebral ischemia: Secondary | ICD-10-CM | POA: Diagnosis not present

## 2024-06-19 DIAGNOSIS — I6200 Nontraumatic subdural hemorrhage, unspecified: Secondary | ICD-10-CM | POA: Diagnosis not present

## 2024-06-19 DIAGNOSIS — I609 Nontraumatic subarachnoid hemorrhage, unspecified: Secondary | ICD-10-CM | POA: Diagnosis not present

## 2024-06-19 LAB — RAPID URINE DRUG SCREEN, HOSP PERFORMED
Amphetamines: NOT DETECTED
Barbiturates: NOT DETECTED
Benzodiazepines: NOT DETECTED
Cocaine: NOT DETECTED
Opiates: NOT DETECTED
Tetrahydrocannabinol: NOT DETECTED

## 2024-06-19 LAB — MRSA NEXT GEN BY PCR, NASAL: MRSA by PCR Next Gen: NOT DETECTED

## 2024-06-19 LAB — CBC
HCT: 38 % (ref 36.0–46.0)
Hemoglobin: 12.2 g/dL (ref 12.0–15.0)
MCH: 30.8 pg (ref 26.0–34.0)
MCHC: 32.1 g/dL (ref 30.0–36.0)
MCV: 96 fL (ref 80.0–100.0)
Platelets: 259 10*3/uL (ref 150–400)
RBC: 3.96 MIL/uL (ref 3.87–5.11)
RDW: 12.2 % (ref 11.5–15.5)
WBC: 12.5 10*3/uL — ABNORMAL HIGH (ref 4.0–10.5)
nRBC: 0 % (ref 0.0–0.2)

## 2024-06-19 LAB — I-STAT CHEM 8, ED
BUN: 25 mg/dL — ABNORMAL HIGH (ref 8–23)
Calcium, Ion: 1.14 mmol/L — ABNORMAL LOW (ref 1.15–1.40)
Chloride: 105 mmol/L (ref 98–111)
Creatinine, Ser: 0.9 mg/dL (ref 0.44–1.00)
Glucose, Bld: 114 mg/dL — ABNORMAL HIGH (ref 70–99)
HCT: 37 % (ref 36.0–46.0)
Hemoglobin: 12.6 g/dL (ref 12.0–15.0)
Potassium: 3.9 mmol/L (ref 3.5–5.1)
Sodium: 140 mmol/L (ref 135–145)
TCO2: 25 mmol/L (ref 22–32)

## 2024-06-19 LAB — HEMOGLOBIN A1C
Hgb A1c MFr Bld: 5.3 % (ref 4.8–5.6)
Mean Plasma Glucose: 105.41 mg/dL

## 2024-06-19 LAB — COMPREHENSIVE METABOLIC PANEL WITH GFR
ALT: 18 U/L (ref 0–44)
AST: 21 U/L (ref 15–41)
Albumin: 4.1 g/dL (ref 3.5–5.0)
Alkaline Phosphatase: 42 U/L (ref 38–126)
Anion gap: 10 (ref 5–15)
BUN: 22 mg/dL (ref 8–23)
CO2: 26 mmol/L (ref 22–32)
Calcium: 9.6 mg/dL (ref 8.9–10.3)
Chloride: 105 mmol/L (ref 98–111)
Creatinine, Ser: 0.83 mg/dL (ref 0.44–1.00)
GFR, Estimated: >60 mL/min (ref >60)
Glucose, Bld: 118 mg/dL — ABNORMAL HIGH (ref 70–99)
Potassium: 4 mmol/L (ref 3.5–5.1)
Sodium: 141 mmol/L (ref 135–145)
Total Bilirubin: 0.8 mg/dL (ref 0.0–1.2)
Total Protein: 6.6 g/dL (ref 6.5–8.1)

## 2024-06-19 LAB — CBG MONITORING, ED: Glucose-Capillary: 111 mg/dL — ABNORMAL HIGH (ref 70–99)

## 2024-06-19 LAB — LIPID PANEL
Cholesterol: 145 mg/dL (ref 0–200)
HDL: 79 mg/dL (ref >40)
LDL Cholesterol: 45 mg/dL (ref 0–99)
Total CHOL/HDL Ratio: 1.8 ratio
Triglycerides: 107 mg/dL (ref <150)
VLDL: 21 mg/dL (ref 0–40)

## 2024-06-19 LAB — DIFFERENTIAL
Abs Immature Granulocytes: 0.07 10*3/uL (ref 0.00–0.07)
Basophils Absolute: 0.1 10*3/uL (ref 0.0–0.1)
Basophils Relative: 0 %
Eosinophils Absolute: 0 10*3/uL (ref 0.0–0.5)
Eosinophils Relative: 0 %
Immature Granulocytes: 1 %
Lymphocytes Relative: 12 %
Lymphs Abs: 1.5 10*3/uL (ref 0.7–4.0)
Monocytes Absolute: 0.7 10*3/uL (ref 0.1–1.0)
Monocytes Relative: 6 %
Neutro Abs: 10.1 10*3/uL — ABNORMAL HIGH (ref 1.7–7.7)
Neutrophils Relative %: 81 %

## 2024-06-19 LAB — PROTIME-INR
INR: 1 (ref 0.8–1.2)
Prothrombin Time: 13.7 s (ref 11.4–15.2)

## 2024-06-19 LAB — ETHANOL: Alcohol, Ethyl (B): <15 mg/dL (ref <15)

## 2024-06-19 LAB — APTT: aPTT: 25 s (ref 24–36)

## 2024-06-19 MED ORDER — ORAL CARE MOUTH RINSE: 15.0000 mL | OROMUCOSAL | Status: DC | PRN

## 2024-06-19 MED ORDER — LABETALOL HCL 5 MG/ML IV SOLN
10.0000 mg | INTRAVENOUS | Status: DC | PRN
  Administered 2024-06-19: 10 mg via INTRAVENOUS
  Filled 2024-06-19: qty 4

## 2024-06-19 MED ORDER — ACETAMINOPHEN 160 MG/5ML PO SOLN: 650.0000 mg | ORAL | Status: DC | PRN

## 2024-06-19 MED ORDER — SENNOSIDES-DOCUSATE SODIUM 8.6-50 MG PO TABS
1.0000 | ORAL_TABLET | Freq: Two times a day (BID) | ORAL | Status: DC
  Administered 2024-06-19 – 2024-06-24 (×8): 1 via ORAL
  Filled 2024-06-19 (×10): qty 1

## 2024-06-19 MED ORDER — LABETALOL HCL 5 MG/ML IV SOLN: 10.0000 mg | INTRAVENOUS | Status: DC | PRN

## 2024-06-19 MED ORDER — CLEVIDIPINE BUTYRATE 0.5 MG/ML IV EMUL: 0.0000 mg/h | INTRAVENOUS | Status: DC

## 2024-06-19 MED ORDER — STROKE: EARLY STAGES OF RECOVERY BOOK
Freq: Once | Status: AC
  Administered 2024-06-20: 1
  Filled 2024-06-19: qty 1

## 2024-06-19 MED ORDER — GADOBUTROL 1 MMOL/ML IV SOLN
6.0000 mL | Freq: Once | INTRAVENOUS | Status: AC | PRN
  Administered 2024-06-19: 6 mL via INTRAVENOUS

## 2024-06-19 MED ORDER — PANTOPRAZOLE SODIUM 40 MG IV SOLR
40.0000 mg | Freq: Every day | INTRAVENOUS | Status: DC
  Administered 2024-06-19: 40 mg via INTRAVENOUS
  Filled 2024-06-19: qty 10

## 2024-06-19 MED ORDER — IOHEXOL 350 MG/ML SOLN
75.0000 mL | Freq: Once | INTRAVENOUS | Status: AC | PRN
  Administered 2024-06-19: 75 mL via INTRAVENOUS

## 2024-06-19 MED ORDER — HYDRALAZINE HCL 20 MG/ML IJ SOLN
10.0000 mg | INTRAMUSCULAR | Status: DC | PRN
  Filled 2024-06-19: qty 1

## 2024-06-19 MED ORDER — ACETAMINOPHEN 325 MG PO TABS: 650.0000 mg | ORAL_TABLET | ORAL | Status: DC | PRN

## 2024-06-19 MED ORDER — ACETAMINOPHEN 650 MG RE SUPP: 650.0000 mg | RECTAL | Status: DC | PRN

## 2024-06-19 MED ORDER — CHLORHEXIDINE GLUCONATE CLOTH 2 % EX PADS
6.0000 | MEDICATED_PAD | Freq: Every day | CUTANEOUS | Status: DC
  Administered 2024-06-19 – 2024-06-24 (×5): 6 via TOPICAL

## 2024-06-19 NOTE — Evaluation (Signed)
 Speech Language Pathology Evaluation Patient Details Name: Chelsea Barnett MRN: [id number] DOB: [birth year] Today's Date: 06/19/2024 Time: 8382-8370 SLP Time Calculation (min) (ACUTE ONLY): 12 min  Problem List:  Patient Active Problem List   Diagnosis Date Noted   Hemorrhagic stroke (HCC) 06/19/2024   Right arm weakness 06/28/2023   CVA (cerebral vascular accident) (HCC) 06/28/2023   Essential hypertension 06/28/2023   History of skin cancer 06/28/2023   Nontraumatic subarachnoid hemorrhage (HCC) 06/28/2023   Chest pain on breathing 11/19/2013   Heart murmur, systolic 08/29/2013   Pure hypercholesterolemia 06/01/2012   Edema 06/01/2012   Weight loss 06/01/2012   Psoriasis 06/01/2012   Past Medical History:  Past Medical History:  Diagnosis Date   Basal cell cancer    right leg   Heart murmur    Hemorrhoids    Melanoma (HCC) 2005   on left arm   Migraines    with aura   Post-operative nausea and vomiting    after 2 surgeries   Psoriasis    Vaginal delivery    times 6   Past Surgical History:  Past Surgical History:  Procedure Laterality Date   basal cell CA removal     BUNIONECTOMY Right    fallopian tube removal     laparoscopic bso- fibroma   melanoma removal     left upper arm   ovaries removed     laparoscopic bso- fibroma   REFRACTIVE SURGERY     Bil   TUBAL LIGATION  1984   WISDOM TOOTH EXTRACTION     HPI:  Chelsea Barnett is a 78 yo female presenting to ED 6/24 with R sided weakness. CTH shows acute parenchymal hemorrhage within the anterior medial L frontal lobe along with an acute SDH along the L aspect of the falx. PMH includes TIA (deemed to be amyloid spells after further neuro w/u), prior CVA, cerebral hemorrhage possibly amyloid angiopathy   Assessment / Plan / Recommendation Clinical Impression  Pt reports being independent with all tasks at baseline. She scored WFL on all subsections of the Cognistat except delayed recall and  calculations, both of which she reports having difficulty with PTA. Discussed at least brief SLP f/u to target functional task of medication management, with which pt and her daughter are agreeable. Will continue following.    SLP Assessment  SLP Recommendation/Assessment: Patient needs continued Speech Language Pathology Services SLP Visit Diagnosis: Cognitive communication deficit (R41.841)     Assistance Recommended at Discharge  Intermittent Supervision/Assistance  Functional Status Assessment Patient has had a recent decline in their functional status and demonstrates the ability to make significant improvements in function in a reasonable and predictable amount of time.  Frequency and Duration min 2x/week  1 week      SLP Evaluation Cognition  Overall Cognitive Status: Impaired/Different from baseline Arousal/Alertness: Awake/alert Orientation Level: Oriented X4 Attention: Selective Selective Attention: Appears intact Memory: Impaired Memory Impairment: Retrieval deficit Awareness: Appears intact Problem Solving: Impaired Problem Solving Impairment: Verbal basic       Comprehension  Auditory Comprehension Overall Auditory Comprehension: Appears within functional limits for tasks assessed    Expression Expression Primary Mode of Expression: Verbal Verbal Expression Overall Verbal Expression: Appears within functional limits for tasks assessed   Oral / Motor  Oral Motor/Sensory Function Overall Oral Motor/Sensory Function: Within functional limits Motor Speech Overall Motor Speech: Appears within functional limits for tasks assessed            Damien Blumenthal, M.A., CCC-SLP Speech  Language Pathology, Acute Rehabilitation Services  Secure Chat preferred [phone]  06/19/2024, 4:41 PM

## 2024-06-19 NOTE — ED Triage Notes (Signed)
 Reports this morning was getting in the shower and right side became weak and difficulty controlling it.  Patient is back to baseline and reports has had this occur before and it was a TIA

## 2024-06-19 NOTE — Code Documentation (Signed)
 Stroke Response Nurse Documentation Code Documentation  Chelsea Barnett is a 78 y.o. female arriving to [place]  via Private Vehicle on 06-19-24 with past medical hx of CVA/SAH, HTN, . On aspirin  81 mg daily. Code stroke was activated by ED.   Patient from home where she was LKW at 9pm last night and now complaining of difficulty lifting her right leg.  She states that she didn't notice any difficulty walking around the house this morning when she woke up.  Per her husband she was walking slowly in th yard when they let the dog out.  When she then tried to get in her tub for a shower she had difficulty getting her right leg over the side.  She states she did not fall at this time.  She does have a bruise on her forehead and endorses she hit her head a day or two ago.    Stroke team at the bedside on patient arrival. Labs drawn and patient cleared for CT by EDP. Patient to CT with team. NIHSS 1, see documentation for details and code stroke times. Patient with right limb ataxia on exam. The following imaging was completed:  CT Head and CTA. Patient is not a candidate for IV Thrombolytic due to bleeding in brain. Patient is not a candidate for IR due to no LVO on CTA.   Care Plan: VS and NHISS q 1 hour.   Bedside handoff with ED RN Izetta.    Elvin Portland  Stroke Response RN

## 2024-06-19 NOTE — ED Notes (Signed)
 Ambulated patient and patient leaning and walking more to the right and off balance

## 2024-06-19 NOTE — H&P (Addendum)
 NEUROLOGY CONSULT NOTE   Date of service: June 19, 2024 Patient Name: Chelsea Barnett MRN:  [id number] DOB:  September 01, 1946 Chief Complaint: Right-sided weakness Requesting Provider: Ellouise Richerd POUR, DO  History of Present Illness  Chelsea Barnett is a 78 y.o. female with hx of what she describes as TIAs but on further workup by neurology have been deemed to be possible amyloid spells, history of small stroke, cerebral hemorrhage possibly amyloid angiopathy, presented for evaluation of gait difficulty and right-sided weakness.  She says that she got into the shower this morning and noticed that she was swerving to the right.  The husband later on did say that she was walking a little slower ever since waking up this morning.  Last known well 9 PM yesterday when she went to bed. Taken for stat CT head which revealed a 3.5 cc acute parenchymal hemorrhage within the anterior medial left frontal lobe along with a an acute subdural hemorrhage along the left aspect of the falx measuring up to 5 mm in thickness with no midline shift.  CT angiography head and CT venogram with no concerning findings  LKW: 9 PM yesterday 06/18/2024 Modified rankin score: 1-No significant post stroke disability and can perform usual duties with stroke symptoms IV Thrombolysis: No-ICH ICH Score:0 NIH stroke scale-1 for ataxia in the right leg   ROS  Comprehensive ROS performed and pertinent positives documented in HPI   Past History   Past Medical History:  Diagnosis Date   Basal cell cancer    right leg   Heart murmur    Hemorrhoids    Melanoma (HCC) 2005   on left arm   Migraines    with aura   Post-operative nausea and vomiting    after 2 surgeries   Psoriasis    Vaginal delivery    times 6    Past Surgical History:  Procedure Laterality Date   basal cell CA removal     BUNIONECTOMY Right    fallopian tube removal     laparoscopic bso- fibroma   melanoma removal     left upper arm    ovaries removed     laparoscopic bso- fibroma   REFRACTIVE SURGERY     Bil   TUBAL LIGATION  1984   WISDOM TOOTH EXTRACTION      Family History: Family History  Problem Relation Age of Onset   Heart failure Father    Heart disease Father        CVD   Melanoma Mother    Hypertension Mother    Heart disease Mother        CVD   Depression Sister    Melanoma Sister    Kidney failure Sister    Heart disease Brother        stint   Atrial fibrillation Sister     Social History  reports that she has never smoked. She has never used smokeless tobacco. She reports that she does not currently use alcohol . She reports that she does not use drugs.  No Known Allergies  Medications  No current facility-administered medications for this encounter.  Current Outpatient Medications:    acetaminophen  (TYLENOL ) 325 MG tablet, Take 650 mg by mouth as needed for headache., Disp: , Rfl:    Apremilast (OTEZLA PO), Take 1 tablet by mouth daily., Disp: , Rfl:    aspirin  81 MG chewable tablet, Chew 1 tablet (81 mg total) by mouth daily., Disp: 90 tablet, Rfl: 0   clobetasol  cream (TEMOVATE) 0.05 %, Apply 1 Application topically as needed (psoriasis)., Disp: , Rfl:    estradiol (ESTRACE) 0.1 MG/GM vaginal cream, Place 1 Applicatorful vaginally as needed (dryness)., Disp: , Rfl:    Fluocinolone Acetonide Body 0.01 % OIL, Apply 1 Application topically as needed (psoriasis). To scalp, Disp: , Rfl:    losartan  (COZAAR ) 50 MG tablet, Take 1 tablet (50 mg total) by mouth in the morning AND 0.5 tablets (25 mg total) every evening., Disp: 45 tablet, Rfl: 11   metoprolol  succinate (TOPROL  XL) 25 MG 24 hr tablet, Take 1 tablet (25 mg total) by mouth daily., Disp: 90 tablet, Rfl: 3   Multiple Vitamins-Minerals (MULTIVITAMIN WITH MINERALS) tablet, Take 1 tablet by mouth daily., Disp: , Rfl:    mupirocin ointment (BACTROBAN) 2 %, Apply 1 Application topically as needed (cuts)., Disp: , Rfl:    rosuvastatin   (CRESTOR ) 10 MG tablet, Take 0.5 tablets (5 mg total) by mouth daily., Disp: 45 tablet, Rfl: 3   traZODone (DESYREL) 50 MG tablet, Take 50 mg by mouth at bedtime., Disp: , Rfl:    triamcinolone cream (KENALOG) 0.1 %, Apply 1 Application topically as needed (psoriasis)., Disp: , Rfl:    Vitamin D , Ergocalciferol , (DRISDOL ) 50000 units CAPS capsule, Take 50,000 Units by mouth every 7 (seven) days., Disp: , Rfl:   Vitals   Vitals:   06-30-24 0915 06/30/24 0938 06/30/2024 0957 30-Jun-2024 1000  BP:  (!) 133/59 (!) 128/59 129/62  Pulse:  62 66 65  Resp:   18 15  Temp:      SpO2:   100% 100%  Weight: 56.7 kg     Height: 5' 3 (1.6 m)       Body mass index is 22.14 kg/m.  Physical Exam  General: Awake alert in no distress HEENT: Normocephalic atraumatic Lungs: Clear Cardiovascular: Regular rhythm Neurological exam Awake alert oriented x 3 No dysarthria No evidence of aphasia Cranial nerves II to XII intact Motor examination with no drift in any of the 4 extremities Sensation intact to light touch without extinction Coordination examination reveals mild dysmetria in right heel-knee-shin testing, otherwise unremarkable  Labs/Imaging/Neurodiagnostic studies   CBC:  Recent Labs  Lab 06-30-24 0929 06-30-2024 0933  WBC 12.5*  --   NEUTROABS 10.1*  --   HGB 12.2 12.6  HCT 38.0 37.0  MCV 96.0  --   PLT 259  --    Basic Metabolic Panel:  Lab Results  Component Value Date   NA 140 June 30, 2024   K 3.9 2024-06-30   CO2 26 06-30-2024   GLUCOSE 114 (H) 2024/06/30   BUN 25 (H) 06-30-2024   CREATININE 0.90 Jun 30, 2024   CALCIUM  9.6 06/30/24   GFRNONAA >60 30-Jun-2024   Lipid Panel:  Lab Results  Component Value Date   LDLCALC 37 09/19/2023   HgbA1c:  Lab Results  Component Value Date   HGBA1C 5.4 06/28/2023   Alcohol  Level     Component Value Date/Time   [facility] <15 06/30/24 0929   INR  Lab Results  Component Value Date   INR 1.0 2024/06/30   APTT  Lab Results   Component Value Date   APTT 25 Jun 30, 2024  CT Head without contrast(Personally reviewed): 3.5 cc acute parenchymal hemorrhage within the anteromedial left frontal lobe.  Acute subdural along the left aspect of the falx measuring 5 mm in thickness with no midline shift.  CT angio Head and Neck with contrast, CT venogram (Personally reviewed): No ELVO in the head or  neck. No spot sign No CVST  ASSESSMENT   ROBYNNE ROAT is a 78 y.o. female past medical history as above presenting for evaluation of right-sided weakness and difficulty walking and on examination has right-sided lower extremity ataxia, found to have a 3.5 cc acute parenchymal hemorrhage within the intracranial left frontal lobe as well as a acute subdural along the left aspect of the falx measuring about 5 mm in thickness with no midline shift. Given her history of prior ICH and concern for cerebral amyloid angiopathy, this is likely a sequela of the cerebral amyloid angiopathy. She is on aspirin  at home and also took 2 Tylenol 's this morning.  Impression Intracerebral hemorrhage, right frontal cortical region-likely secondary to cerebral amyloid angiopathy  RECOMMENDATIONS  Admit to neuro ICU-neurology/stroke service primary. Frequent neurochecks Telemetry No antiplatelets or anticoagulants SCD for DVT prophylaxis Check labs replete electrolytes as needed Repeat head CT at 4 PM MRI brain with and without contrast when able to Gentle hydration Check chest x-ray and UA 2D echo, A1c, lipid panel Therapy assessments N.p.o. until cleared by bedside swallow evaluation or formal swallow evaluation  SBP 130-150  Full code Docusate senna for bowel PPI for GI prophylaxis  Plan discussed with Dr. Ellouise and the patient as well as her husband ______________________________________________________________________    Signed, Eligio Lav, MD [facility]  CRITICAL CARE ATTESTATION Performed by:  Eligio Lav, MD Total critical care time: 45 minutes Critical care time was exclusive of separately billable procedures and treating other patients and/or supervising APPs/Residents/Students Critical care was necessary to treat or prevent imminent or life-threatening deterioration. This patient is critically ill and at significant risk for neurological worsening and/or death and care requires constant monitoring. Critical care was time spent personally by me on the following activities: development of treatment plan with patient and/or surrogate as well as nursing, discussions with consultants, evaluation of patient's response to treatment, examination of patient, obtaining history from patient or surrogate, ordering and performing treatments and interventions, ordering and review of laboratory studies, ordering and review of radiographic studies, pulse oximetry, re-evaluation of patient's condition, participation in multidisciplinary rounds and medical decision making of high complexity in the care of this patient.

## 2024-06-19 NOTE — ED Notes (Signed)
 Lab reports adding on lipid panel and A1C to previous lab draw.

## 2024-06-19 NOTE — ED Provider Triage Note (Signed)
 Emergency Medicine Provider Triage Evaluation Note  Chelsea Barnett , a 78 y.o. female  was evaluated in triage.  Pt complains of more unsteady and some right leg weakness.  Symptoms possibly started at 8 AM.  She was having difficulty getting her leg up over the edge of the tub.  Husband noticed she was walking slower and was more unsteady than usual.  Review of Systems  Positive: Right leg weakness Negative: Headache  Physical Exam  BP (!) 119/58 (BP Location: Right Arm)   Pulse (!) 58   Temp 98 F (36.7 C)   Resp 16   Ht 5' 3 (1.6 m)   Wt 56.7 kg   LMP 12/28/1999   SpO2 100%   BMI 22.14 kg/m  Gen:   Awake, no distress   Resp:  Normal effort  MSK:   Moves extremities without difficulty  Other:    Medical Decision Making  Medically screening exam initiated at 9:21 AM.  Appropriate orders placed.  Chelsea Barnett was informed that the remainder of the evaluation will be completed by another provider, this initial triage assessment does not replace that evaluation, and the importance of remaining in the ED until their evaluation is complete.  Patient with some subtle gait disturbance and drifting more to the right.  Unclear if she woke up with this or this is new since this morning but she said she definitely felt very weak acutely at 8 AM.  Will activate as a code stroke for now and have neurology evaluate her.   Towana Ozell BROCKS, MD 06/19/24 [phone]

## 2024-06-19 NOTE — Plan of Care (Signed)
 Neuro exam  has been stable with BP 104-128 systolic   Liberalize BP parameters to 100 - 150 SBP goal   Lola Jernigan MD-PhD [facility] [phone]

## 2024-06-19 NOTE — ED Notes (Signed)
 ED Provider at bedside.

## 2024-06-19 NOTE — ED Notes (Signed)
 Patient just brought back from MRI

## 2024-06-19 NOTE — ED Provider Notes (Signed)
 [place] EMERGENCY DEPARTMENT AT [facility] Provider Note   CSN: [id number] Arrival date & time: 06/19/24  9093  An emergency department physician performed an initial assessment on this suspected stroke patient at 0927.  Patient presents with: Neurologic Problem   Chelsea Barnett is a 78 y.o. female.   Patient is a 78 year old female with past medical history of TIA, SAH, hypertension presenting to the emergency department with right sided weakness.  The patient states that she got up this morning to take a shower and when she tried to get into the bathtub she noticed that her right leg was weak and she had to lift it to get into the tub.  She states that she did not take a bath and then got out and was unable to stand and lowered herself to the ground.  She denies hitting her head.  She states that she felt a little off earlier this morning but denies any headache.  States that she did hit her head a few days ago but has had no headaches or pain since then.  She denies any associated nausea, vomiting, numbness or tingling.  The history is provided by the patient and the spouse.  Neurologic Problem       Prior to Admission medications   Medication Sig Start Date End Date Taking? Authorizing Provider  acetaminophen  (TYLENOL ) 325 MG tablet Take 650 mg by mouth as needed for headache. 07/28/20   [provider]  Apremilast (OTEZLA PO) Take 1 tablet by mouth daily.    [provider]  aspirin  81 MG chewable tablet Chew 1 tablet (81 mg total) by mouth daily. 06/30/23   Singh, Prashant K, MD  clobetasol cream (TEMOVATE) 0.05 % Apply 1 Application topically as needed (psoriasis). 12/10/19   [provider]  estradiol (ESTRACE) 0.1 MG/GM vaginal cream Place 1 Applicatorful vaginally as needed (dryness). 01/16/20   [provider]  Fluocinolone Acetonide Body 0.01 % OIL Apply 1 Application topically as needed (psoriasis). To scalp    [provider]  losartan  (COZAAR ) 50 MG tablet Take 1 tablet (50 mg total) by mouth in the morning AND 0.5 tablets (25 mg total) every evening. 07/21/23   Rolan Ezra RAMAN, MD  metoprolol  succinate (TOPROL  XL) 25 MG 24 hr tablet Take 1 tablet (25 mg total) by mouth daily. 08/17/23 08/16/24  Rolan Ezra RAMAN, MD  Multiple Vitamins-Minerals (MULTIVITAMIN WITH MINERALS) tablet Take 1 tablet by mouth daily.    [provider]  mupirocin ointment (BACTROBAN) 2 % Apply 1 Application topically as needed (cuts). 06/15/23   [provider]  rosuvastatin  (CRESTOR ) 10 MG tablet Take 0.5 tablets (5 mg total) by mouth daily. 04/11/24   Sethi, Pramod S, MD  traZODone (DESYREL) 50 MG tablet Take 50 mg by mouth at bedtime. 03/12/24   [provider]  triamcinolone cream (KENALOG) 0.1 % Apply 1 Application topically as needed (psoriasis). 04/10/20   [provider]  Vitamin D , Ergocalciferol , (DRISDOL ) 50000 units CAPS capsule Take 50,000 Units by mouth every 7 (seven) days.    [provider]    Allergies: Patient has no known allergies.    Review of Systems  Updated Vital Signs BP 129/62   Pulse 65   Temp 98 F (36.7 C)   Resp 15   Ht 5' 3 (1.6 m)   Wt 56.7 kg   LMP 12/28/1999   SpO2 100%   BMI 22.14 kg/m   Physical Exam Vitals and  nursing note reviewed.  Constitutional:      General: She is not in acute distress.    Appearance: Normal appearance.  HENT:     Head: Normocephalic.     Comments: Old appearing healing bruise to mid forehead    Nose: Nose normal.     Mouth/Throat:     Mouth: Mucous membranes are moist.     Pharynx: Oropharynx is clear.   Eyes:     Extraocular Movements: Extraocular movements intact.     Conjunctiva/sclera: Conjunctivae normal.     Pupils: Pupils are equal, round, and reactive to light.    Cardiovascular:     Rate and Rhythm: Normal rate and regular rhythm.     Heart sounds: Normal heart sounds.  Pulmonary:      Effort: Pulmonary effort is normal.     Breath sounds: Normal breath sounds.  Abdominal:     General: Abdomen is flat.     Palpations: Abdomen is soft.     Tenderness: There is no abdominal tenderness.   Musculoskeletal:        General: Normal range of motion.     Cervical back: Normal range of motion.   Skin:    General: Skin is warm and dry.     Comments: Old appearing bruising to bilateral anterior shins   Neurological:     General: No focal deficit present.     Mental Status: She is alert and oriented to person, place, and time.     Cranial Nerves: No cranial nerve deficit.     Sensory: No sensory deficit.     Motor: No weakness.     Coordination: Coordination normal.     Comments: No drift in all 4 extremities  Mild confusion on timing of recent events, appears to be baseline  Psychiatric:        Mood and Affect: Mood normal.        Behavior: Behavior normal.     (all labs ordered are listed, but only abnormal results are displayed) Labs Reviewed  CBC - Abnormal; Notable for the following components:      Result Value   WBC 12.5 (*)    All other components within normal limits  DIFFERENTIAL - Abnormal; Notable for the following components:   Neutro Abs 10.1 (*)    All other components within normal limits  COMPREHENSIVE METABOLIC PANEL WITH GFR - Abnormal; Notable for the following components:   Glucose, Bld 118 (*)    All other components within normal limits  I-STAT CHEM 8, ED - Abnormal; Notable for the following components:   BUN 25 (*)    Glucose, Bld 114 (*)    Calcium , Ion 1.14 (*)    All other components within normal limits  CBG MONITORING, ED - Abnormal; Notable for the following components:   Glucose-Capillary 111 (*)    All other components within normal limits  ETHANOL  PROTIME-INR  APTT  RAPID URINE DRUG SCREEN, HOSP PERFORMED    EKG: EKG Interpretation Date/Time:  Tuesday June 19 2024 09:57:37 EDT Ventricular Rate:  64 PR  Interval:  186 QRS Duration:  92 QT Interval:  414 QTC Calculation: 428 R Axis:   59  Text Interpretation: Sinus rhythm Low voltage, precordial leads Probable anteroseptal infarct, old PVCs resolved, otherwise no significant change Confirmed by Ellouise Fine ([id number]) on 06/19/2024 10:14:23 AM  Radiology: CT ANGIO HEAD NECK W WO CM (CODE STROKE) Result Date: 06/19/2024 CLINICAL DATA:  Neuro deficit, acute, stroke suspected. Right-sided  weakness. Stroke, hemorrhagic. EXAM: CT ANGIOGRAPHY HEAD AND NECK WITH AND WITHOUT CONTRAST CT VENOGRAM HEAD TECHNIQUE: Multidetector CT imaging of the head and neck was performed using the standard protocol during bolus administration of intravenous contrast. Multiplanar CT image reconstructions and MIPs were obtained to evaluate the vascular anatomy. Carotid stenosis measurements (when applicable) are obtained utilizing NASCET criteria, using the distal internal carotid diameter as the denominator. Venographic phase images of the brain were obtained following the administration of intravenous contrast. Multiplanar reformats and maximum intensity projections were generated. RADIATION DOSE REDUCTION: This exam was performed according to the departmental dose-optimization program which includes automated exposure control, adjustment of the mA and/or kV according to patient size and/or use of iterative reconstruction technique. CONTRAST:  75mL OMNIPAQUE  IOHEXOL  350 MG/ML SOLN COMPARISON:  Noncontrast head CT performed earlier today 06/19/2024. CT angiogram head/neck 06/28/2023. FINDINGS: CTA NECK FINDINGS Aortic arch: Common origin of the innominate and left common carotid arteries. The left vertebral artery arises directly from the aortic arch. Atherosclerotic plaque within the visible aortic arch. Streak/beam hardening artifact arising from a dense contrast bolus partially obscures the right subclavian artery. Within this limitation, there is no appreciable hemodynamically  significant innominate or proximal subclavian artery stenosis. Right carotid system: CCA and ICA patent within the neck without stenosis or significant atherosclerotic disease. Left carotid system: CCA and ICA patent within the neck without stenosis or significant atherosclerotic disease. Vertebral arteries: The left vertebral artery is non dominant and developmentally diminutive, but patent throughout the neck. The left vertebral artery is patent within the neck without stenosis or significant atherosclerotic disease. Skeleton: No acute fracture or aggressive osseous lesion. Spondylosis at the cervical and visualized upper thoracic levels. Facet ankylosis on the left at C3-C4 and on the right at C4-C5. Mild chronic T2 superior endplate vertebral compression deformity, unchanged. Mild grade 1 anterolisthesis at C6-C7, T1-T2 and T2-T3. Other neck: Thyroid  nodules measuring up to 12 mm, not meeting consensus criteria for ultrasound follow-up based on size. No follow-up imaging recommended. Reference: J Am Coll Radiol. 2015 Feb;12(2): 143-50. Upper chest: No consolidation within the imaged lung apices. Review of the MIP images confirms the above findings CTA HEAD FINDINGS Anterior circulation: The intracranial internal carotid arteries are patent. The M1 middle cerebral arteries are patent. No M2 proximal branch occlusion or high-grade proximal stenosis. The anterior cerebral arteries are patent. Hypoplastic right A1 segment. No intracranial aneurysm is identified. Posterior circulation: The intracranial right vertebral artery is patent. The left vertebral artery remains developmentally diminutive intracranially and predominantly supplies the left PICA. The basilar artery is patent. The posterior cerebral arteries are patent. Hypoplastic right P1 segment with sizable right posterior communicating artery. A posterior communicating artery is also present on the left. Venous sinuses: Separately reported on same day CT  venogram head. Anatomic variants: As described. Other: No spot sign identified in the region of the left frontal lobe hemorrhage on delayed imaging. Review of the MIP images confirms the above findings CT VENOGRAM HEAD FINDINGS The superior sagittal sinus, internal cerebral veins, vein of Galen, straight sinus, transverse sinuses, sigmoid sinuses and visualized jugular veins are patent. There is no appreciable intracranial venous thrombosis. No emergent large vessel occlusion identified. These results were communicated to Dr. Voncile at 10:20 amon 6/24/2025by text page via the [facility] messaging system. IMPRESSION: CTA neck: 1. The common carotid and internal carotid arteries are patent within the neck without stenosis or significant atherosclerotic disease. 2. The right vertebral artery is patent within the neck without stenosis  or significant atherosclerotic disease. 3. The left vertebral artery is non-dominant and developing diminutive, but patent throughout the neck. 4. Aortic Atherosclerosis (ICD10-I70.0). CTA head: 1. No proximal intracranial large vessel occlusion or high-grade proximal arterial stenosis identified. 2. No evidence of an intracranial aneurysm or arteriovenous malformation. CT venogram head: No evidence of dural venous sinus thrombosis. Electronically Signed   By: Rockey Childs D.O.   On: 06/19/2024 10:21   CT VENOGRAM HEAD Result Date: 06/19/2024 CLINICAL DATA:  Neuro deficit, acute, stroke suspected. Right-sided weakness. Stroke, hemorrhagic. EXAM: CT ANGIOGRAPHY HEAD AND NECK WITH AND WITHOUT CONTRAST CT VENOGRAM HEAD TECHNIQUE: Multidetector CT imaging of the head and neck was performed using the standard protocol during bolus administration of intravenous contrast. Multiplanar CT image reconstructions and MIPs were obtained to evaluate the vascular anatomy. Carotid stenosis measurements (when applicable) are obtained utilizing NASCET criteria, using the distal internal carotid diameter as  the denominator. Venographic phase images of the brain were obtained following the administration of intravenous contrast. Multiplanar reformats and maximum intensity projections were generated. RADIATION DOSE REDUCTION: This exam was performed according to the departmental dose-optimization program which includes automated exposure control, adjustment of the mA and/or kV according to patient size and/or use of iterative reconstruction technique. CONTRAST:  75mL OMNIPAQUE  IOHEXOL  350 MG/ML SOLN COMPARISON:  Noncontrast head CT performed earlier today 06/19/2024. CT angiogram head/neck 06/28/2023. FINDINGS: CTA NECK FINDINGS Aortic arch: Common origin of the innominate and left common carotid arteries. The left vertebral artery arises directly from the aortic arch. Atherosclerotic plaque within the visible aortic arch. Streak/beam hardening artifact arising from a dense contrast bolus partially obscures the right subclavian artery. Within this limitation, there is no appreciable hemodynamically significant innominate or proximal subclavian artery stenosis. Right carotid system: CCA and ICA patent within the neck without stenosis or significant atherosclerotic disease. Left carotid system: CCA and ICA patent within the neck without stenosis or significant atherosclerotic disease. Vertebral arteries: The left vertebral artery is non dominant and developmentally diminutive, but patent throughout the neck. The left vertebral artery is patent within the neck without stenosis or significant atherosclerotic disease. Skeleton: No acute fracture or aggressive osseous lesion. Spondylosis at the cervical and visualized upper thoracic levels. Facet ankylosis on the left at C3-C4 and on the right at C4-C5. Mild chronic T2 superior endplate vertebral compression deformity, unchanged. Mild grade 1 anterolisthesis at C6-C7, T1-T2 and T2-T3. Other neck: Thyroid  nodules measuring up to 12 mm, not meeting consensus criteria for  ultrasound follow-up based on size. No follow-up imaging recommended. Reference: J Am Coll Radiol. 2015 Feb;12(2): 143-50. Upper chest: No consolidation within the imaged lung apices. Review of the MIP images confirms the above findings CTA HEAD FINDINGS Anterior circulation: The intracranial internal carotid arteries are patent. The M1 middle cerebral arteries are patent. No M2 proximal branch occlusion or high-grade proximal stenosis. The anterior cerebral arteries are patent. Hypoplastic right A1 segment. No intracranial aneurysm is identified. Posterior circulation: The intracranial right vertebral artery is patent. The left vertebral artery remains developmentally diminutive intracranially and predominantly supplies the left PICA. The basilar artery is patent. The posterior cerebral arteries are patent. Hypoplastic right P1 segment with sizable right posterior communicating artery. A posterior communicating artery is also present on the left. Venous sinuses: Separately reported on same day CT venogram head. Anatomic variants: As described. Other: No spot sign identified in the region of the left frontal lobe hemorrhage on delayed imaging. Review of the MIP images confirms the above findings CT VENOGRAM  HEAD FINDINGS The superior sagittal sinus, internal cerebral veins, vein of Galen, straight sinus, transverse sinuses, sigmoid sinuses and visualized jugular veins are patent. There is no appreciable intracranial venous thrombosis. No emergent large vessel occlusion identified. These results were communicated to Dr. Voncile at 10:20 amon 6/24/2025by text page via the [facility] messaging system. IMPRESSION: CTA neck: 1. The common carotid and internal carotid arteries are patent within the neck without stenosis or significant atherosclerotic disease. 2. The right vertebral artery is patent within the neck without stenosis or significant atherosclerotic disease. 3. The left vertebral artery is non-dominant and  developing diminutive, but patent throughout the neck. 4. Aortic Atherosclerosis (ICD10-I70.0). CTA head: 1. No proximal intracranial large vessel occlusion or high-grade proximal arterial stenosis identified. 2. No evidence of an intracranial aneurysm or arteriovenous malformation. CT venogram head: No evidence of dural venous sinus thrombosis. Electronically Signed   By: Rockey Childs D.O.   On: 06/19/2024 10:21   CT HEAD CODE STROKE WO CONTRAST Result Date: 06/19/2024 CLINICAL DATA:  Code stroke. Neuro deficit, acute, stroke suspected. Right-sided weakness. EXAM: CT HEAD WITHOUT CONTRAST TECHNIQUE: Contiguous axial images were obtained from the base of the skull through the vertex without intravenous contrast. RADIATION DOSE REDUCTION: This exam was performed according to the departmental dose-optimization program which includes automated exposure control, adjustment of the mA and/or kV according to patient size and/or use of iterative reconstruction technique. COMPARISON:  Head CT 03/09/2024.  CT angiogram head/neck 06/28/2023. FINDINGS: Brain: No age-advanced or lobar predominant cerebral atrophy. 3.8 x 1.3 x 1.4 cm (3.5 mL) acute parenchymal hemorrhage within the anteromedial left frontal lobe. Acute subdural hemorrhage along the left aspect of the falx (measuring up to 5 mm in thickness). Patchy and ill-defined hypoattenuation within the cerebral white matter, nonspecific but compatible with mild chronic small vessel ischemic disease. No evidence of an intracranial mass. No midline shift or hydrocephalus. Vascular: No hyperdense vessel.  Atherosclerotic calcifications. Skull: No calvarial fracture or aggressive osseous lesion. Sinuses/Orbits: No mass or acute finding within the imaged orbits. Minimal mucosal thickening within the left maxillary sinus at the imaged levels. These results were called by telephone at the time of interpretation on 06/19/2024 at 9:50 am to provider Dr. Voncile, who verbally  acknowledged these results. IMPRESSION: 1. 3.8 x 1.3 x 1.4 cm (3.5 mL) acute parenchymal hemorrhage within the anteromedial left frontal lobe. 2. Acute subdural hemorrhage along the left aspect of the falx (measuring up to 5 mm in thickness). 3. No midline shift. 4. Mild cerebral white matter chronic small vessel ischemic disease. Electronically Signed   By: Rockey Childs D.O.   On: 06/19/2024 09:51     .Critical Care  Performed by: Kingsley, Brissa Asante K, DO Authorized by: Ellouise Richerd POUR, DO   Critical care provider statement:    Critical care time (minutes):  30   Critical care was necessary to treat or prevent imminent or life-threatening deterioration of the following conditions:  CNS failure or compromise   Critical care was time spent personally by me on the following activities:  Development of treatment plan with patient or surrogate, discussions with consultants, evaluation of patient's response to treatment, examination of patient, ordering and review of laboratory studies, ordering and review of radiographic studies, ordering and performing treatments and interventions, pulse oximetry, re-evaluation of patient's condition and review of old charts    Medications Ordered in the ED  iohexol  (OMNIPAQUE ) 350 MG/ML injection 75 mL (75 mLs Intravenous Contrast Given 06/19/24 0945)  Clinical Course as of 06/19/24 1038  Tue Jun 19, 2024  1031 No significant abnormality on CTA/CTV. Pending final neurology recommendations. [VK]  1035 Dr. Arora with neurology plans to admit to neurology service. [VK]    Clinical Course User Index [VK] Kingsley, Karyme Mcconathy K, DO                                 Medical Decision Making This patient presents to the ED with chief complaint(s) of R-sided weakness with pertinent past medical history of TIA/CVA, SAH, HTN which further complicates the presenting complaint. The complaint involves an extensive differential diagnosis and also carries with it a high  risk of complications and morbidity.    The differential diagnosis includes CVA, TIA, ICH, mass effect, hypo or hyperglycemia, electrolyte derangement, infection, recrudescence of prior stroke  Additional history obtained: Additional history obtained from spouse Records reviewed outpatient neurology records  ED Course and Reassessment: On patient's arrival she was hemodynamically stable in no acute distress.  Was initially evaluated by provider in triage and was noted to have right sided weakness with possible last known well around 8 AM this morning and code stroke was called.  She was immediately transported to CT scanner and evaluated by neurology and CT.  Head CT's shows an acute frontal intraparenchymal hemorrhage with small subdural hematoma, CTA and CTV were ordered.  The patient is currently hemodynamically stable without focal deficits on my exam.  Pending final neurology recommendations at this time.  She is on aspirin , has not taken any yet this morning, blood pressures currently controlled.  Independent labs interpretation:  The following labs were independently interpreted: mild leukocytosis, otherwise within normal range  Independent visualization of imaging: - I independently visualized the following imaging with scope of interpretation limited to determining acute life threatening conditions related to emergency care: CTH, CTA/CTV head/neck, which revealed intraparenchymal hemorrhage with SDH  Consultation: - Consulted or discussed management/test interpretation w/ external professional: neurology  Consideration for admission or further workup: patient requires admission for hemorrhagic stroke Social Determinants of health: N/A    Risk Decision regarding hospitalization.       Final diagnoses:  Hemorrhagic stroke [facility])    ED Discharge Orders     None          Kingsley, Sugey Trevathan K, DO 06/19/24 1038

## 2024-06-19 NOTE — Progress Notes (Signed)
 MRI brain with stable size of the bleed. Can repeat head CT tomorrow morning unless anything changes on her neuroexam  -- Eligio Lav, MD Neurologist [facility]

## 2024-06-20 ENCOUNTER — Inpatient Hospital Stay (HOSPITAL_COMMUNITY)

## 2024-06-20 DIAGNOSIS — E785 Hyperlipidemia, unspecified: Secondary | ICD-10-CM

## 2024-06-20 DIAGNOSIS — I6200 Nontraumatic subdural hemorrhage, unspecified: Secondary | ICD-10-CM | POA: Diagnosis not present

## 2024-06-20 DIAGNOSIS — I10 Essential (primary) hypertension: Secondary | ICD-10-CM

## 2024-06-20 DIAGNOSIS — R29700 NIHSS score 0: Secondary | ICD-10-CM | POA: Diagnosis not present

## 2024-06-20 DIAGNOSIS — R930 Abnormal findings on diagnostic imaging of skull and head, not elsewhere classified: Secondary | ICD-10-CM | POA: Diagnosis not present

## 2024-06-20 DIAGNOSIS — E859 Amyloidosis, unspecified: Secondary | ICD-10-CM | POA: Diagnosis not present

## 2024-06-20 DIAGNOSIS — G936 Cerebral edema: Secondary | ICD-10-CM | POA: Diagnosis not present

## 2024-06-20 DIAGNOSIS — I611 Nontraumatic intracerebral hemorrhage in hemisphere, cortical: Secondary | ICD-10-CM | POA: Diagnosis not present

## 2024-06-20 LAB — URINALYSIS, ROUTINE W REFLEX MICROSCOPIC
Bilirubin Urine: NEGATIVE
Glucose, UA: NEGATIVE mg/dL
Ketones, ur: 5 mg/dL — AB
Leukocytes,Ua: NEGATIVE
Nitrite: NEGATIVE
Protein, ur: NEGATIVE mg/dL
Specific Gravity, Urine: 1.031 — ABNORMAL HIGH (ref 1.005–1.030)
pH: 5 (ref 5.0–8.0)

## 2024-06-20 LAB — BASIC METABOLIC PANEL WITH GFR
Anion gap: 9 (ref 5–15)
BUN: 20 mg/dL (ref 8–23)
CO2: 26 mmol/L (ref 22–32)
Calcium: 9 mg/dL (ref 8.9–10.3)
Chloride: 104 mmol/L (ref 98–111)
Creatinine, Ser: 0.7 mg/dL (ref 0.44–1.00)
GFR, Estimated: >60 mL/min (ref >60)
Glucose, Bld: 133 mg/dL — ABNORMAL HIGH (ref 70–99)
Potassium: 3.8 mmol/L (ref 3.5–5.1)
Sodium: 139 mmol/L (ref 135–145)

## 2024-06-20 LAB — CBC
HCT: 36.5 % (ref 36.0–46.0)
Hemoglobin: 12 g/dL (ref 12.0–15.0)
MCH: 30.9 pg (ref 26.0–34.0)
MCHC: 32.9 g/dL (ref 30.0–36.0)
MCV: 94.1 fL (ref 80.0–100.0)
Platelets: 253 10*3/uL (ref 150–400)
RBC: 3.88 MIL/uL (ref 3.87–5.11)
RDW: 12.5 % (ref 11.5–15.5)
WBC: 7.7 10*3/uL (ref 4.0–10.5)
nRBC: 0 % (ref 0.0–0.2)

## 2024-06-20 LAB — ECHOCARDIOGRAM COMPLETE
AR max vel: 3.19 cm2
AV Area VTI: 2.86 cm2
AV Area mean vel: 2.74 cm2
AV Mean grad: 7 mmHg
AV Peak grad: 12.5 mmHg
Ao pk vel: 1.77 m/s
Area-P 1/2: 4.06 cm2
Height: 63 in
P 1/2 time: 506 ms
S' Lateral: 2.8 cm
Weight: 2000 [oz_av]

## 2024-06-20 LAB — SODIUM: Sodium: 139 mmol/L (ref 135–145)

## 2024-06-20 MED ORDER — MIRTAZAPINE 15 MG PO TABS
30.0000 mg | ORAL_TABLET | Freq: Every day | ORAL | Status: DC
  Administered 2024-06-20 – 2024-06-23 (×4): 30 mg via ORAL
  Filled 2024-06-20 (×4): qty 2

## 2024-06-20 MED ORDER — HYDRALAZINE HCL 20 MG/ML IJ SOLN
10.0000 mg | INTRAMUSCULAR | Status: DC | PRN
  Administered 2024-06-21: 10 mg via INTRAVENOUS
  Filled 2024-06-20: qty 1

## 2024-06-20 MED ORDER — SODIUM CHLORIDE 3 % IV SOLN
INTRAVENOUS | Status: DC
  Filled 2024-06-20: qty 500

## 2024-06-20 MED ORDER — SODIUM CHLORIDE 0.9 % IV SOLN: INTRAVENOUS | Status: DC

## 2024-06-20 MED ORDER — LABETALOL HCL 5 MG/ML IV SOLN
10.0000 mg | INTRAVENOUS | Status: AC | PRN
  Administered 2024-06-21: 10 mg via INTRAVENOUS
  Filled 2024-06-20: qty 4

## 2024-06-20 MED ORDER — LOSARTAN POTASSIUM 50 MG PO TABS
50.0000 mg | ORAL_TABLET | Freq: Every day | ORAL | Status: DC
  Administered 2024-06-20 – 2024-06-23 (×4): 50 mg via ORAL
  Filled 2024-06-20 (×4): qty 1

## 2024-06-20 MED ORDER — LOSARTAN POTASSIUM 25 MG PO TABS
25.0000 mg | ORAL_TABLET | Freq: Every day | ORAL | Status: DC
  Administered 2024-06-20 – 2024-06-21 (×2): 25 mg via ORAL
  Filled 2024-06-20 (×3): qty 1

## 2024-06-20 MED ORDER — METOPROLOL TARTRATE 12.5 MG HALF TABLET
12.5000 mg | ORAL_TABLET | Freq: Two times a day (BID) | ORAL | Status: DC
  Administered 2024-06-20 – 2024-06-23 (×7): 12.5 mg via ORAL
  Filled 2024-06-20 (×7): qty 1

## 2024-06-20 NOTE — TOC Initial Note (Signed)
 Transition of Care [facility]) - Initial/Assessment Note    Patient Details  Name: Chelsea Barnett MRN: [id number] Date of Birth: 02-23-1946  Transition of Care [facility]) CM/SW Contact:    Marval Gell, RN Phone Number: 06/20/2024, 12:04 PM  Clinical Narrative:                  Spoke with patient and spouse at bedside.  Awaiting PT OT recs.  Patient from home w spouse, supportive.  {Atient has cane at home. Doing well up to chair, nurse notes deficits leaning to the right when standing.  We discussed HH services. They have no preference for provider.  Spouse to transport home at DC, no barriers to PCP or medications.   TOC following for PT OT recs      Expected Discharge Plan: Home w Home Health Services Barriers to Discharge: Continued Medical Work up   Patient Goals and CMS Choice Patient states their goals for this hospitalization and ongoing recovery are:: to go home   Choice offered to / list presented to : Patient, Spouse      Expected Discharge Plan and Services   Discharge Planning Services: CM Consult Post Acute Care Choice: Home Health Living arrangements for the past 2 months: Single Family Home                                      Prior Living Arrangements/Services Living arrangements for the past 2 months: Single Family Home Lives with:: Spouse                   Activities of Daily Living   ADL Screening (condition at time of admission) Independently performs ADLs?: Yes (appropriate for developmental age) Is the patient deaf or have difficulty hearing?: No Does the patient have difficulty seeing, even when wearing glasses/contacts?: No Does the patient have difficulty concentrating, remembering, or making decisions?: No  Permission Sought/Granted                  Emotional Assessment              Admission diagnosis:  Hemorrhagic stroke [facility]) [I61.9] Patient Active Problem List   Diagnosis Date Noted   Hemorrhagic stroke  (HCC) 06/19/2024   Right arm weakness 06/28/2023   CVA (cerebral vascular accident) (HCC) 06/28/2023   Essential hypertension 06/28/2023   History of skin cancer 06/28/2023   Nontraumatic subarachnoid hemorrhage (HCC) 06/28/2023   Chest pain on breathing 11/19/2013   Heart murmur, systolic 08/29/2013   Pure hypercholesterolemia 06/01/2012   Edema 06/01/2012   Weight loss 06/01/2012   Psoriasis 06/01/2012   PCP:  Loreli Elsie JONETTA Mickey., MD Pharmacy:   [facility] - [place], [place] - [address] [place] [address] Phone: [phone] Fax: [phone]  [phone] [address] - [place] , [place] - [address] AT [place] [address] at [employer] [place]  [place] [address] Phone: [phone] Fax: [phone]     Social Drivers of Health (SDOH) Social History: SDOH Screenings   Food Insecurity: No Food Insecurity (06/19/2024)  Housing: Low Risk  (06/19/2024)  Transportation Needs: No Transportation Needs (06/19/2024)  Utilities: Not At Risk (06/19/2024)  Social Connections: Moderately Integrated (06/19/2024)  Tobacco Use: Low Risk  (06/19/2024)   SDOH Interventions:     Readmission  Risk Interventions     No data to display

## 2024-06-20 NOTE — Evaluation (Signed)
 Clinical/Bedside Swallow Evaluation Patient Details  Name: Chelsea Barnett MRN: [id number] Date of Birth: Feb 12, 1946  Today's Date: 06/20/2024 Time: SLP Start Time (ACUTE ONLY): 0957 SLP Stop Time (ACUTE ONLY): 1012 SLP Time Calculation (min) (ACUTE ONLY): 15 min  Past Medical History:  Past Medical History:  Diagnosis Date   Basal cell cancer    right leg   Heart murmur    Hemorrhoids    Melanoma (HCC) 2005   on left arm   Migraines    with aura   Post-operative nausea and vomiting    after 2 surgeries   Psoriasis    Vaginal delivery    times 6   Past Surgical History:  Past Surgical History:  Procedure Laterality Date   basal cell CA removal     BUNIONECTOMY Right    fallopian tube removal     laparoscopic bso- fibroma   melanoma removal     left upper arm   ovaries removed     laparoscopic bso- fibroma   REFRACTIVE SURGERY     Bil   TUBAL LIGATION  1984   WISDOM TOOTH EXTRACTION     HPI:  Chelsea Barnett is a 78 yo female presenting to ED 6/24 with R sided weakness. CTH shows acute parenchymal hemorrhage within the anterior medial L frontal lobe along with an acute SDH along the L aspect of the falx. PMH includes TIA (deemed to be amyloid spells after further neuro w/u), prior CVA, cerebral hemorrhage possibly amyloid angiopathy    Assessment / Plan / Recommendation  Clinical Impression  SLP consulted to evaluate pt's swallowing after episode of what sounds like persistent globus sensation with subsequent regurgitation of her breakfast. RN readministered the swallow screen and pt stopped drinking and immediately coughed. Pt denies esophageal dysphagia or precedence of similar events PTA. She completed the 3 oz water test without signs clinically concerning for aspiration. Oral transit was efficient, achieving complete oral clearance with purees and solids. Recommend resuming regular diet with thin liquids. Education was provided regarding esophageal precautions  and if similar incidents recur, recommend consideration of further esophageal w/u if indicated. SLP will f/u at least briefly but do not anticipate post-acute needs for dysphagia. SLP Visit Diagnosis: Dysphagia, unspecified (R13.10)    Aspiration Risk  Mild aspiration risk    Diet Recommendation Regular;Thin liquid    Liquid Administration via: Cup;Straw Medication Administration: Whole meds with liquid Supervision: Patient able to self feed Compensations: Slow rate;Small sips/bites Postural Changes: Seated upright at 90 degrees;Remain upright for at least 30 minutes after po intake    Other  Recommendations Oral Care Recommendations: Oral care BID     Assistance Recommended at Discharge    Functional Status Assessment Patient has had a recent decline in their functional status and demonstrates the ability to make significant improvements in function in a reasonable and predictable amount of time.  Frequency and Duration min 2x/week  1 week       Prognosis Prognosis for improved oropharyngeal function: Good      Swallow Study   General HPI: Chelsea Barnett is a 78 yo female presenting to ED 6/24 with R sided weakness. CTH shows acute parenchymal hemorrhage within the anterior medial L frontal lobe along with an acute SDH along the L aspect of the falx. PMH includes TIA (deemed to be amyloid spells after further neuro w/u), prior CVA, cerebral hemorrhage possibly amyloid angiopathy Type of Study: Bedside Swallow Evaluation Previous Swallow Assessment: none in chart Diet  Prior to this Study: NPO Temperature Spikes Noted: No Respiratory Status: Room air History of Recent Intubation: No Behavior/Cognition: Alert;Cooperative;Pleasant mood Oral Cavity Assessment: Within Functional Limits Oral Care Completed by SLP: No Oral Cavity - Dentition: Adequate natural dentition Vision: Functional for self-feeding Self-Feeding Abilities: Able to feed self Patient Positioning: Upright in  bed Baseline Vocal Quality: Normal Volitional Cough: Strong Volitional Swallow: Able to elicit    Oral/Motor/Sensory Function Overall Oral Motor/Sensory Function: Within functional limits   Ice Chips Ice chips: Not tested   Thin Liquid Thin Liquid: Within functional limits Presentation: Straw;Self Fed    Nectar Thick Nectar Thick Liquid: Not tested   Honey Thick Honey Thick Liquid: Not tested   Puree Puree: Within functional limits Presentation: Spoon;Self Fed   Solid     Solid: Within functional limits Presentation: Self Fed      Damien Blumenthal, M.A., CCC-SLP Speech Language Pathology, Acute Rehabilitation Services  Secure Chat preferred [phone]  06/20/2024,10:37 AM

## 2024-06-20 NOTE — Progress Notes (Signed)

## 2024-06-20 NOTE — Evaluation (Signed)
 Occupational Therapy Evaluation Patient Details Name: Chelsea Barnett MRN: [id number] DOB: 06/15/1946 Today's Date: 06/20/2024   History of Present Illness   The pt is a 78 yo female presenting 6/24 with transient R-sided weakness. CT showed 3.5cc acute parenchymal hemorrhage in anterior medial L frontal lobe and acute SDH along L aspect of falx without midline shift. PMH includes: SAH of L frontal and parietal lobes, infarcts of L frontal lobe, migraines, psoriasis, and skin cancer.     Clinical Impressions Pt admitted with the above diagnosis. Pt currently with functional limitations due to the deficits listed below (see OT Problem List). Prior to admit, pt was living at home with husband, Independent with all BADL tasks. Pt demonstrating difficulty with cognition, balance, functional mobility, and Left side strength/coordination.  Pt will benefit from acute skilled OT to increase their safety and independence with ADL and functional mobility for ADL to facilitate discharge. Patient will benefit from intensive inpatient follow-up therapy, >3 hours/day.       If plan is discharge home, recommend the following:   A little help with walking and/or transfers;A little help with bathing/dressing/bathroom;Help with stairs or ramp for entrance;Assist for transportation;Assistance with cooking/housework;Supervision due to cognitive status     Functional Status Assessment   Patient has had a recent decline in their functional status and demonstrates the ability to make significant improvements in function in a reasonable and predictable amount of time.     Equipment Recommendations   BSC/3in1;Other (comment) (TBD)      Precautions/Restrictions   Precautions Precautions: Fall Recall of Precautions/Restrictions: Impaired Precaution/Restrictions Comments: Goal SBP 100-150, impulsive, posterior and right lean Restrictions Weight Bearing Restrictions Per Provider Order: No      Mobility Bed Mobility    General bed mobility comments: OOB in chair at start and end of session    Transfers Overall transfer level: Needs assistance Equipment used: Rolling walker (2 wheels), None Transfers: Sit to/from Stand, Bed to chair/wheelchair/BSC Sit to Stand: Min assist     Step pivot transfers: Min assist     General transfer comment: posterior lean with stand      Balance Overall balance assessment: Needs assistance Sitting-balance support: No upper extremity supported, Feet supported Sitting balance-Leahy Scale: Good   Postural control: Right lateral lean, Posterior lean Standing balance support: Single extremity supported, Bilateral upper extremity supported, During functional activity Standing balance-Leahy Scale: Poor      ADL either performed or assessed with clinical judgement   ADL Overall ADL's : Needs assistance/impaired Eating/Feeding: Supervision/ safety;Sitting   Grooming: Wash/dry face;Oral care;Set up;Sitting   Upper Body Bathing: Set up;Sitting   Lower Body Bathing: Sit to/from stand;Contact guard assist   Upper Body Dressing : Set up;Sitting   Lower Body Dressing: Contact guard assist;Sit to/from stand   Toilet Transfer: Minimal assistance;Ambulation;Regular Toilet   Toileting- Clothing Manipulation and Hygiene: Contact guard assist;Sit to/from stand;Cueing for sequencing;Cueing for safety        Vision Baseline Vision/History: 1 Wears glasses Ability to See in Adequate Light: 0 Adequate Patient Visual Report: No change from baseline Additional Comments: unable to assess vision formally during evaluation. pt wears reading glasses as well as glasses for watching TV     Perception Perception: Within Functional Limits    Praxis Praxis: Not tested     Pertinent Vitals/Pain Pain Assessment Pain Assessment: No/denies pain     Extremity/Trunk Assessment Upper Extremity Assessment Upper Extremity Assessment: Right hand  dominant;RUE deficits/detail RUE Deficits / Details: 4+/5 overall strength. Sheldon  grasp functional. RUE Coordination: decreased gross motor;decreased fine motor   Lower Extremity Assessment Lower Extremity Assessment: Defer to PT evaluation   Cervical / Trunk Assessment Cervical / Trunk Assessment: Normal   Communication Communication Communication: No apparent difficulties   Cognition Arousal: Alert Behavior During Therapy: Impulsive, Flat affect Cognition: Cognition impaired     Awareness: Intellectual awareness impaired, Online awareness impaired Memory impairment (select all impairments): Short-term memory Attention impairment (select first level of impairment): Selective attention, Alternating attention, Divided attention Executive functioning impairment (select all impairments): Organization, Initiation, Reasoning, Problem solving    Following commands: Impaired Following commands impaired: Follows one step commands inconsistently, Follows one step commands with increased time, Follows multi-step commands inconsistently     Cueing  General Comments   Cueing Techniques: Verbal cues  VSS on RA           Home Living Family/patient expects to be discharged to:: Private residence Living Arrangements: Spouse/significant other Available Help at Discharge: Family;Available 24 hours/day Type of Home: House Home Access: Stairs to enter [employer] of Steps: 3 back,  3 back porch into the kitchen Entrance Stairs-Rails: None Home Layout: Multi-level Alternate Level Stairs-Number of Steps: one level technically although 1 step down into bedroom   Bathroom Shower/Tub: Walk-in shower;Tub/shower unit   [employer]: Standard     Home Equipment: Information systems manager - built in;Hand held Engineer, civil (consulting) - single point      Lives With: Spouse    Prior Functioning/Environment Prior Level of Function : Independent/Modified Independent      Mobility Comments:  didn't use SPC at baseline.  Mobile at baseline, walking ~1 mile a day. no falls ADLs Comments: husband completes meal prep. pt handles medication management    OT Problem List: Decreased strength;Decreased coordination;Decreased cognition;Decreased knowledge of use of DME or AE;Impaired balance (sitting and/or standing);Decreased safety awareness   OT Treatment/Interventions: Self-care/ADL training;Therapeutic exercise;Therapeutic activities;Neuromuscular education;Cognitive remediation/compensation;DME and/or AE instruction;Balance training;Patient/family education;Manual therapy      OT Goals(Current goals can be found in the care plan section)   Acute Rehab OT Goals Patient Stated Goal: none stated OT Goal Formulation: With patient Time For Goal Achievement: 07/04/24 Potential to Achieve Goals: Good   OT Frequency:  Min 2X/week    Co-evaluation PT/OT/SLP Co-Evaluation/Treatment: Yes Reason for Co-Treatment: For patient/therapist safety;To address functional/ADL transfers   OT goals addressed during session: ADL's and self-care;Strengthening/ROM;Proper use of Adaptive equipment and DME      AM-PAC OT 6 Clicks Daily Activity     Outcome Measure Help from another person eating meals?: None Help from another person taking care of personal grooming?: None Help from another person toileting, which includes using toliet, bedpan, or urinal?: A Little Help from another person bathing (including washing, rinsing, drying)?: A Little Help from another person to put on and taking off regular upper body clothing?: None Help from another person to put on and taking off regular lower body clothing?: A Little 6 Click Score: 21   End of Session Equipment Utilized During Treatment: Gait belt;Rolling walker (2 wheels) Nurse Communication: Mobility status;Other (comment) (need for supervision while up in recliner)  Activity Tolerance: Patient tolerated treatment well Patient left: in  chair;with call bell/phone within reach;with chair alarm set;with family/visitor present  OT Visit Diagnosis: Muscle weakness (generalized) (M62.81);Hemiplegia and hemiparesis Hemiplegia - caused by: Other Nontraumatic intracranial hemorrhage                Time: 8842-8774 OT Time Calculation (min): 28 min Charges:  OT  General Charges $OT Visit: 1 Visit OT Evaluation $OT Eval Moderate Complexity: 1 Mod  [employer], OTR/L,CBIS  Supplemental OT - [facility] and [employer] Secure Chat Preferred    Siaosi Alter, Leita BIRCH 06/20/2024, 2:21 PM

## 2024-06-20 NOTE — Evaluation (Signed)
 Physical Therapy Evaluation Patient Details Name: Chelsea Barnett MRN: [id number] DOB: 1946-11-19 Today's Date: 06/20/2024  History of Present Illness  The pt is a 78 yo female presenting 6/24 with transient R-sided weakness. CT showed 3.5cc acute parenchymal hemorrhage in anterior medial L frontal lobe and acute SDH along L aspect of falx without midline shift. PMH includes: SAH of L frontal and parietal lobes, infarcts of L frontal lobe, migraines, psoriasis, and skin cancer.   Clinical Impression  Pt in bed upon arrival of PT, agreeable to evaluation at this time. Prior to admission the pt was ambulating in the home without UE support or DME and reports no falls in last 6 months. The pt's spouse reports she is generally active throughout day and able to manage multiple single steps throughout house without issue. The pt presents today with deficits in standing and dynamic balance, as pt with frequent LOB posteriorly and to R, especially when standing or ambulating without UE support. The pt was able to demo improved balance with use of RW, but needing up to modA to manage balance with gait and modA to manage stair navigation. Recommend intensive therapies at this time to facilitate return to independence.     If plan is discharge home, recommend the following: A lot of help with walking and/or transfers;A lot of help with bathing/dressing/bathroom;Assistance with cooking/housework;Direct supervision/assist for medications management;Direct supervision/assist for financial management;Assist for transportation;Help with stairs or ramp for entrance   Can travel by private vehicle        Equipment Recommendations None recommended by PT  Recommendations for Other Services  Rehab consult    Functional Status Assessment Patient has had a recent decline in their functional status and demonstrates the ability to make significant improvements in function in a reasonable and predictable amount of  time.     Precautions / Restrictions Precautions Precautions: Fall Recall of Precautions/Restrictions: Impaired Precaution/Restrictions Comments: Goal SBP 100-150 Restrictions Weight Bearing Restrictions Per Provider Order: No      Mobility  Bed Mobility               General bed mobility comments: OOB in chair at start and end of session    Transfers Overall transfer level: Needs assistance Equipment used: Rolling walker (2 wheels), None Transfers: Sit to/from Stand Sit to Stand: Min assist           General transfer comment: posterior lean with stand    Ambulation/Gait Ambulation/Gait assistance: Min assist, Mod assist Gait Distance (Feet): 150 Feet Assistive device: Rolling walker (2 wheels), 1 person hand held assist, None Gait Pattern/deviations: Step-through pattern, Decreased stride length, Shuffle, Drifts right/left, Staggering right Gait velocity: decreased Gait velocity interpretation: <1.31 ft/sec, indicative of household ambulator   General Gait Details: R lateral drift and veering to R even with RW. min-modA to manage balance without UE support. poor awareness of LOB  Stairs Stairs: Yes Stairs assistance: Min assist, Mod assist Stair Management: No rails, Step to pattern, Alternating pattern, Forwards Number of Stairs: 12 General stair comments: pt instructed step up first step, pt then climbed all 12 steps without rail initially needing modA for alternating steps and minA for step-to.  Wheelchair Mobility     Tilt Bed    Modified Rankin (Stroke Patients Only) Modified Rankin (Stroke Patients Only) Pre-Morbid Rankin Score: No significant disability Modified Rankin: Moderately severe disability     Balance Overall balance assessment: Needs assistance Sitting-balance support: No upper extremity supported, Feet supported Sitting balance-Leahy Scale: Good  Postural control: Right lateral lean, Posterior lean Standing balance support:  Single extremity supported, Bilateral upper extremity supported, During functional activity Standing balance-Leahy Scale: Poor Standing balance comment: falling and veering to R, no reaction to adjust or correct LOB.                 Standardized Balance Assessment Standardized Balance Assessment : Dynamic Gait Index   Dynamic Gait Index Level Surface: Moderate Impairment (without RW) Change in Gait Speed: Moderate Impairment Gait with Horizontal Head Turns: Severe Impairment Gait with Vertical Head Turns: Moderate Impairment Gait and Pivot Turn: Moderate Impairment Step Over Obstacle: Moderate Impairment Step Around Obstacles: Mild Impairment Steps: Moderate Impairment Total Score: 8       Pertinent Vitals/Pain Pain Assessment Pain Assessment: No/denies pain    Home Living Family/patient expects to be discharged to:: Private residence Living Arrangements: Spouse/significant other Available Help at Discharge: Family;Available 24 hours/day Type of Home: House Home Access: Stairs to enter Entrance Stairs-Rails: None Entrance Stairs-Number of Steps: 3 back,  3 back porch into the kitchen Alternate Level Stairs-Number of Steps: one level technically although 1 step down into bedroom Home Layout: Multi-level Home Equipment: Shower seat - built in;Hand held shower head;Cane - single point      Prior Function Prior Level of Function : Independent/Modified Independent             Mobility Comments: didn't use SPC at baseline.  Mobile at baseline, walking ~1 mile a day. no falls ADLs Comments: husband completes meal prep. pt handles medication management     Extremity/Trunk Assessment   Upper Extremity Assessment Upper Extremity Assessment: Defer to OT evaluation    Lower Extremity Assessment Lower Extremity Assessment: Generalized weakness (grossly equal and 4-/5 to MMT. no change in sensation)    Cervical / Trunk Assessment Cervical / Trunk Assessment: Normal   Communication   Communication Communication: No apparent difficulties    Cognition Arousal: Alert Behavior During Therapy: Impulsive, Flat affect   PT - Cognitive impairments: Awareness, Memory, Sequencing, Problem solving, Safety/Judgement                       PT - Cognition Comments: pt able to answer questions with slight delay and follow commands inconsistently. Pt with poor insight tosafety and impuslive with mobility despite balance deficits Following commands: Impaired Following commands impaired: Follows one step commands inconsistently, Follows one step commands with increased time, Follows multi-step commands inconsistently     Cueing Cueing Techniques: Verbal cues     General Comments General comments (skin integrity, edema, etc.): VSS oN RA    Exercises     Assessment/Plan    PT Assessment Patient needs continued PT services  PT Problem List Decreased strength;Decreased balance;Decreased activity tolerance;Decreased mobility;Decreased coordination;Decreased safety awareness       PT Treatment Interventions DME instruction;Gait training;Stair training;Functional mobility training;Therapeutic activities;Therapeutic exercise;Balance training;Neuromuscular re-education;Patient/family education    PT Goals (Current goals can be found in the Care Plan section)  Acute Rehab PT Goals Patient Stated Goal: return home PT Goal Formulation: With patient Time For Goal Achievement: 07/04/24 Potential to Achieve Goals: Good    Frequency Min 3X/week        AM-PAC PT 6 Clicks Mobility  Outcome Measure Help needed turning from your back to your side while in a flat bed without using bedrails?: A Little Help needed moving from lying on your back to sitting on the side of a flat bed without using bedrails?: A  Little Help needed moving to and from a bed to a chair (including a wheelchair)?: A Lot Help needed standing up from a chair using your arms (e.g.,  wheelchair or bedside chair)?: A Lot Help needed to walk in hospital room?: A Lot Help needed climbing 3-5 steps with a railing? : A Lot 6 Click Score: 14    End of Session Equipment Utilized During Treatment: Gait belt Activity Tolerance: Patient tolerated treatment well Patient left: in chair;with call bell/phone within reach;with chair alarm set;with family/visitor present Nurse Communication: Mobility status PT Visit Diagnosis: Unsteadiness on feet (R26.81);Difficulty in walking, not elsewhere classified (R26.2)    Time: 8840-8775 PT Time Calculation (min) (ACUTE ONLY): 25 min   Charges:   PT Evaluation $PT Eval Moderate Complexity: 1 Mod             Izetta Call, PT, DPT   Acute Rehabilitation Department Office [phone] Secure Chat Communication Preferred  Izetta JULIANNA Call 06/20/2024, 2:02 PM

## 2024-06-20 NOTE — Progress Notes (Signed)
 0740: Patient choked on food while eating breakfast and threw up. Pt described that the food felt like it was lodged in her throat and it gagged her. NIH remains a 0. No c/o of HA, nausea, dizziness, blurred or double vision. Educated patient and family member of the risks of n/v and swallowing issues related to her stroke.   Food and drink removed from patient for an hour to allow her to settle and will attempt a yale swallow screen to assess patients swallowing.  06/20/2024 Charletta Bathe, RN, BSN 7:48 AM

## 2024-06-20 NOTE — Progress Notes (Signed)
 PT Cancellation Note  Patient Details Name: Chelsea Barnett MRN: [id number] DOB: 02/07/1946   Cancelled Treatment:    Reason Eval/Treat Not Completed: Active bedrest order this morning. Per discussion with RN, slight increase in bleed overnight, will hold PT eval until cleared by MD.  Izetta Call, PT, DPT   Acute Rehabilitation Department Office [phone] Secure Chat Communication Preferred   Izetta JULIANNA Call 06/20/2024, 9:53 AM

## 2024-06-20 NOTE — Progress Notes (Signed)
*  PRELIMINARY RESULTS* Echocardiogram 2D Echocardiogram has been performed.  Chelsea Barnett 06/20/2024, 8:50 AM

## 2024-06-20 NOTE — Progress Notes (Addendum)
 STROKE TEAM PROGRESS NOTE    SIGNIFICANT HOSPITAL EVENTS 6/24-patient admitted with left frontal lobe ICH, briefly given hypertonic saline but this is now been discontinued  INTERIM HISTORY/SUBJECTIVE  Patient has remained hemodynamically stable and afebrile overnight.  She is noted to be alert and oriented with no deficits on neuroexam.  OBJECTIVE  CBC    Component Value Date/Time   WBC 7.7 06/20/2024 0854   RBC 3.88 06/20/2024 0854   HGB 12.0 06/20/2024 0854   HCT 36.5 06/20/2024 0854   PLT 253 06/20/2024 0854   MCV 94.1 06/20/2024 0854   MCH 30.9 06/20/2024 0854   MCHC 32.9 06/20/2024 0854   RDW 12.5 06/20/2024 0854   LYMPHSABS 1.5 06/19/2024 0929   MONOABS 0.7 06/19/2024 0929   EOSABS 0.0 06/19/2024 0929   BASOSABS 0.1 06/19/2024 0929    BMET    Component Value Date/Time   NA 139 06/20/2024 0854   K 3.8 06/20/2024 0854   CL 104 06/20/2024 0854   CO2 26 06/20/2024 0854   GLUCOSE 133 (H) 06/20/2024 0854   BUN 20 06/20/2024 0854   CREATININE 0.70 06/20/2024 0854   CALCIUM  9.0 06/20/2024 0854   GFRNONAA >60 06/20/2024 0854    IMAGING past 24 hours ECHOCARDIOGRAM COMPLETE Result Date: 06/20/2024    ECHOCARDIOGRAM REPORT   Patient Name:   Chelsea Barnett Krygier Date of Exam: 06/20/2024 Medical Rec #:  [id number]           Height:       63.0 in Accession #:    [id number]          Weight:       125.0 lb Date of Birth:  1946-04-23           BSA:          1.584 m Patient Age:    78 years            BP:           124/81 mmHg Patient Gender: F                   HR:           66 bpm. Exam Location:  Inpatient Procedure: 2D Echo, Color Doppler and Cardiac Doppler (Both Spectral and Color            Flow Doppler were utilized during procedure). Indications:    Stroke  History:        Patient has prior history of Echocardiogram examinations, most                 recent 06/28/2023.  Sonographer:    Benard Stallion Referring Phys: [id number] ASHISH ARORA IMPRESSIONS  1. Left ventricular  ejection fraction, by estimation, is 55 to 60%. The left ventricle has normal function. The left ventricle has no regional wall motion abnormalities. Left ventricular diastolic parameters were normal.  2. Right ventricular systolic function is normal. The right ventricular size is normal. There is normal pulmonary artery systolic pressure.  3. The mitral valve is normal in structure. Trivial mitral valve regurgitation. No evidence of mitral stenosis.  4. The aortic valve is normal in structure. Aortic valve regurgitation is mild. No aortic stenosis is present.  5. The inferior vena cava is normal in size with greater than 50% respiratory variability, suggesting right atrial pressure of 3 mmHg. FINDINGS  Left Ventricle: Left ventricular ejection fraction, by estimation, is 55 to 60%. The left ventricle has normal function. The left ventricle  has no regional wall motion abnormalities. The left ventricular internal cavity size was normal in size. There is  no left ventricular hypertrophy. Left ventricular diastolic parameters were normal. Right Ventricle: The right ventricular size is normal. No increase in right ventricular wall thickness. Right ventricular systolic function is normal. There is normal pulmonary artery systolic pressure. The tricuspid regurgitant velocity is 2.64 m/s, and  with an assumed right atrial pressure of 3 mmHg, the estimated right ventricular systolic pressure is 30.9 mmHg. Left Atrium: Left atrial size was normal in size. Right Atrium: Right atrial size was normal in size. Pericardium: There is no evidence of pericardial effusion. Mitral Valve: The mitral valve is normal in structure. Trivial mitral valve regurgitation. No evidence of mitral valve stenosis. Tricuspid Valve: The tricuspid valve is normal in structure. Tricuspid valve regurgitation is mild . No evidence of tricuspid stenosis. Aortic Valve: The aortic valve is normal in structure. Aortic valve regurgitation is mild. Aortic  regurgitation PHT measures 506 msec. No aortic stenosis is present. Aortic valve mean gradient measures 7.0 mmHg. Aortic valve peak gradient measures 12.5 mmHg. Aortic valve area, by VTI measures 2.86 cm. Pulmonic Valve: The pulmonic valve was normal in structure. Pulmonic valve regurgitation is mild. No evidence of pulmonic stenosis. Aorta: The aortic root is normal in size and structure. Venous: The inferior vena cava is normal in size with greater than 50% respiratory variability, suggesting right atrial pressure of 3 mmHg. IAS/Shunts: No atrial level shunt detected by color flow Doppler.  LEFT VENTRICLE PLAX 2D LVIDd:         4.70 cm   Diastology LVIDs:         2.80 cm   LV e' medial:    6.68 cm/s LV PW:         0.80 cm   LV E/e' medial:  10.9 LV IVS:        0.80 cm   LV e' lateral:   10.90 cm/s LVOT diam:     2.10 cm   LV E/e' lateral: 6.7 LV SV:         101 LV SV Index:   64 LVOT Area:     3.46 cm  RIGHT VENTRICLE RV Basal diam:  3.60 cm RV Mid diam:    3.30 cm RV S prime:     23.60 cm/s LEFT ATRIUM             Index        RIGHT ATRIUM           Index LA diam:        3.30 cm 2.08 cm/m   RA Area:     14.70 cm LA Vol (A2C):   41.4 ml 26.14 ml/m  RA Volume:   35.90 ml  22.67 ml/m LA Vol (A4C):   36.9 ml 23.30 ml/m LA Biplane Vol: 39.0 ml 24.63 ml/m  AORTIC VALVE AV Area (Vmax):    3.19 cm AV Area (Vmean):   2.74 cm AV Area (VTI):     2.86 cm AV Vmax:           177.00 cm/s AV Vmean:          118.667 cm/s AV VTI:            0.354 m AV Peak Grad:      12.5 mmHg AV Mean Grad:      7.0 mmHg LVOT Vmax:         163.00 cm/s LVOT Vmean:  93.800 cm/s LVOT VTI:          0.293 m LVOT/AV VTI ratio: 0.83 AI PHT:            506 msec  AORTA Ao Root diam: 2.90 cm Ao Asc diam:  3.30 cm MITRAL VALVE               TRICUSPID VALVE MV Area (PHT): 4.06 cm    TR Peak grad:   27.9 mmHg MV Decel Time: 187 msec    TR Vmax:        264.00 cm/s MV E velocity: 72.60 cm/s MV A velocity: 90.70 cm/s  SHUNTS MV E/A ratio:  0.80         Systemic VTI:  0.29 m                            Systemic Diam: 2.10 cm Morene Brownie Electronically signed by Morene Brownie Signature Date/Time: 06/20/2024/11:15:52 AM    Final    CT HEAD POST STROKE FOLLOWUP/TIMED/STAT READ Result Date: 06/20/2024 EXAM: CT HEAD WITHOUT CONTRAST 06/20/2024 05:37:14 AM TECHNIQUE: CT of the head was performed without the administration of intravenous contrast. Automated exposure control, iterative reconstruction, and/or weight based adjustment of the mA/kV was utilized to reduce the radiation dose to as low as reasonably achievable. COMPARISON: None available. CLINICAL HISTORY: Neuro deficit, acute, stroke suspected. Follow up stroke. Left frontal hemorrhage. FINDINGS: BRAIN AND VENTRICLES: The anterior medial left frontal lobe hemorrhage has increased in size since the previous study. The hemorrhage now measures 4.4 x 2.5 x 2.5 cm. It measured 3.7 x 1.9 x 1.3 cm on the first CT scan yesterday. Vasogenic edema is present with local mass effect. Parafalcine subdural hemorrhage is stable. Periventricular white matter changes are similar to the prior study. No new hemorrhage or ischemic infarct is present. ORBITS: No acute abnormality. SINUSES: No acute abnormality. SOFT TISSUES AND SKULL: No acute soft tissue abnormality. No skull fracture. IMPRESSION: 1. Increased size of the anterior medial left frontal lobe hemorrhage, now measuring 4.4 x 2.5 x 2.5 cm, with associated vasogenic edema and local mass effect. 2. Stable parafalcine subdural hemorrhage. 3. No new hemorrhage or ischemic infarct. 4. Periventricular white matter changes similar to the prior study. The pertinent results were texted to Dr. Jerrie via the [facility] system at 5:54 am. Electronically signed by: Lonni Necessary MD 06/20/2024 05:55 AM EDT RP Workstation: [id number]    Vitals:   06/20/24 1100 06/20/24 1130 06/20/24 1200 06/20/24 1300  BP: (!) 112/54 132/75 121/61 112/67  Pulse: 68 79 62 64  Resp:  (!) 22 (!) 33 (!) 21 17  Temp:   98.4 F (36.9 C)   TempSrc:   Oral   SpO2: 96% 98% 99% 98%  Weight:      Height:         PHYSICAL EXAM General:  Alert, well-nourished, well-developed patient in no acute distress Psych:  Mood and affect appropriate for situation CV: Regular rate and rhythm on monitor Respiratory:  Regular, unlabored respirations on room air   NEURO:  Mental Status: AA&Ox3, patient is able to give clear and coherent history, does report some recent memory problems Speech/Language: speech is without dysarthria or aphasia.    Cranial Nerves:  II: PERRL.  III, IV, VI: EOMI. Eyelids elevate symmetrically.  V: Sensation is intact to light touch and symmetrical to face.  VII: Face is symmetrical resting and smiling VIII: hearing intact to voice.  IX, X: Phonation is normal.  KP:Dynloizm shrug 5/5. XII: tongue is midline without fasciculations. Motor: Able to move all 4 extremities with good antigravity strength Tone: is normal and bulk is normal Sensation- Intact to light touch bilaterally. Coordination: FTN intact bilaterally Gait- deferred  Most Recent NIH 0   ASSESSMENT/PLAN  Chelsea Barnett is a 78 y.o. female with history of TIAs versus amyloid spells, stroke, ICH and possible cerebral amyloid angiopathy admitted for gait instability and right-sided weakness.  Patient was found on CT to have acute 3.5 cc IPH in anterior medial left frontal lobe as well as subdural hemorrhage along the left aspect of the falx.  Patient reports some recent memory problems and has been seen by neurology for this, and there is also a question of cerebral amyloid angiopathy.  NIH on Admission 1  ICH: Left frontal lobe IPH with falcine SDH, etiology: Likely cerebral amyloid angiopathy Code Stroke CT head 3.5 mm acute IPH within anteromedial left frontal lobe, acute subdural hemorrhage along the left aspect of the falx CTA head & neck LVO, aneurysm or AVM CTV no venous  thrombosis MRI left frontal lobe hematoma without significant interval change, stable left parafalcine subdural hematoma, chronic hemosiderin staining within left precentral sulcus from remote subarachnoid hemorrhage Repeat CT 6/25 increased size of anterior medial left frontal lobe hemorrhage with some vasogenic edema and local mass effect, stable parafalcine subdural hemorrhage CT repeat in a.m. pending 2D Echo EF 55 to 60%, normal left atrial size, no atrial level shunt LDL 45 HgbA1c 5.3 UDS negative VTE prophylaxis -SCDs No antithrombotic prior to admission, now on No antithrombotic due to IPH and CAA Therapy recommendation CIR Disposition: Pending  Hx of Stroke/TIA/ICH CAA 06/2023 admitted for right upper extremity heaviness x 2.  MRI showed left frontal tiny infarct, left parietal sulcal SAH.  Concerning for CAA with amyloid spells.  EF 60 to 65%.  LDL 130, A1c 5.4.  Discharged on aspirin  81 and Crestor  10.  Hypertension Home meds: Losartan  50 mg daily Stable Long-term BP goal less than 140 given CAA  Hyperlipidemia Home meds: Rosuvastatin  5 mg daily LDL 45, goal < 70 Patient and family will cost about participation in [place] trial  Other Stroke Risk Factors None  Other Active Problems Hematuria noted by RN, urinalysis ordered Mild cognitive impairment since 06/2023 Mild leukocytosis WBC 12.5  Hospital day # 1  Patient seen by NP with MD, MD to edit note as needed. Cortney E Everitt Clint Kill , MSN, AGACNP-BC [facility] See Amion for schedule and pager information 06/20/2024 2:00 PM   ATTENDING NOTE: I reviewed above note and agree with the assessment and plan. Pt was seen and examined.   Husband at bedside.  Patient reclining in bed, AO x 3, no aphasia, no gaze palsy, follows some commands, able to name repeat.  No focal deficit.  CT and MRI showed left frontal ICH with falcine SDH, concerning for CAA.  Recommend no antithrombotics, hold off statin pending  family discussion regarding SATURN trial.  Repeat CT in a.m.  PT and OT recommend CIR  For detailed assessment and plan, please refer to above as I have made changes wherever appropriate.   Ary Cummins, MD PhD Stroke Neurology 06/20/2024 6:14 PM  This patient is critically ill due to ICH, SDH, CAA and at significant risk of neurological worsening, death form recurrent hemorrhage, tumor expansion, brain herniation. This patient's care requires constant monitoring of vital signs, hemodynamics, respiratory and cardiac monitoring, review  of multiple databases, neurological assessment, discussion with family, other specialists and medical decision making of high complexity. I spent 40 minutes of neurocritical care time in the care of this patient. I had long discussion with patient and husband at bedside, updated pt current condition, treatment plan and potential prognosis, and answered all the questions.  They expressed understanding and appreciation.      To contact Stroke Continuity provider, please refer to [email or web address]. After hours, contact General Neurology

## 2024-06-21 ENCOUNTER — Inpatient Hospital Stay (HOSPITAL_COMMUNITY)

## 2024-06-21 DIAGNOSIS — G935 Compression of brain: Secondary | ICD-10-CM | POA: Diagnosis not present

## 2024-06-21 DIAGNOSIS — I6523 Occlusion and stenosis of bilateral carotid arteries: Secondary | ICD-10-CM | POA: Diagnosis not present

## 2024-06-21 DIAGNOSIS — I6200 Nontraumatic subdural hemorrhage, unspecified: Secondary | ICD-10-CM | POA: Diagnosis not present

## 2024-06-21 DIAGNOSIS — R29700 NIHSS score 0: Secondary | ICD-10-CM | POA: Diagnosis not present

## 2024-06-21 DIAGNOSIS — E859 Amyloidosis, unspecified: Secondary | ICD-10-CM | POA: Diagnosis not present

## 2024-06-21 DIAGNOSIS — I611 Nontraumatic intracerebral hemorrhage in hemisphere, cortical: Secondary | ICD-10-CM | POA: Diagnosis not present

## 2024-06-21 LAB — CBC
HCT: 35 % — ABNORMAL LOW (ref 36.0–46.0)
Hemoglobin: 11.3 g/dL — ABNORMAL LOW (ref 12.0–15.0)
MCH: 30.6 pg (ref 26.0–34.0)
MCHC: 32.3 g/dL (ref 30.0–36.0)
MCV: 94.9 fL (ref 80.0–100.0)
Platelets: 241 10*3/uL (ref 150–400)
RBC: 3.69 MIL/uL — ABNORMAL LOW (ref 3.87–5.11)
RDW: 12.7 % (ref 11.5–15.5)
WBC: 6.5 10*3/uL (ref 4.0–10.5)
nRBC: 0 % (ref 0.0–0.2)

## 2024-06-21 LAB — BASIC METABOLIC PANEL WITH GFR
Anion gap: 11 (ref 5–15)
BUN: 15 mg/dL (ref 8–23)
CO2: 22 mmol/L (ref 22–32)
Calcium: 8.7 mg/dL — ABNORMAL LOW (ref 8.9–10.3)
Chloride: 107 mmol/L (ref 98–111)
Creatinine, Ser: 0.61 mg/dL (ref 0.44–1.00)
GFR, Estimated: >60 mL/min (ref >60)
Glucose, Bld: 118 mg/dL — ABNORMAL HIGH (ref 70–99)
Potassium: 4.1 mmol/L (ref 3.5–5.1)
Sodium: 140 mmol/L (ref 135–145)

## 2024-06-21 NOTE — Progress Notes (Addendum)
 Results from f/u CT head WO just called to me from [facility]. Report given to Dr. Jerrie on the scan and updated Bhagat on patient's condition, which remains stable from prior. NIH 0, A/Ox4, FC in all 4 extremities.   9379- Dr. Jerrie responded to notification. Cont POC.

## 2024-06-21 NOTE — Progress Notes (Addendum)
 Physical Therapy Treatment Patient Details Name: Chelsea Barnett MRN: [id number] DOB: 1946/06/21 Today's Date: 06/21/2024   History of Present Illness The pt is a 78 yo female presenting 6/24 with transient R-sided weakness. CT showed 3.5cc acute parenchymal hemorrhage in anterior medial L frontal lobe and acute SDH along L aspect of falx without midline shift. PMH includes: SAH of L frontal and parietal lobes, infarcts of L frontal lobe, migraines, psoriasis, and skin cancer.    PT Comments  The pt is demonstrating improvements in balance as she does not appear to be leaning to the R as much today. However, she is staggering posteriorly frequently when she she navigates stairs, tries to pick up objects off the floor, or tries to ambulate without a RW, needing min-modA to recover. She remains impulsive with poor memory and safety awareness, often continuing to try to progress mobility before she has even recovered her balance, needing cues to stop, acknowledge the deficit, and fix it. She remains at high risk for falls, supported by her DGI score of 10/24 when using RW and her DGI score of 7/24 when not using an AD. A score of </= 19/24 on the DGI is predictive of falls in the elderly. She still remains a good candidate for intensive inpatient rehab, > 3 hours/day. Will continue to follow acutely.    If plan is discharge home, recommend the following: A lot of help with walking and/or transfers;A lot of help with bathing/dressing/bathroom;Assistance with cooking/housework;Direct supervision/assist for medications management;Direct supervision/assist for financial management;Assist for transportation;Help with stairs or ramp for entrance;Supervision due to cognitive status   Can travel by private vehicle        Equipment Recommendations  Rolling walker (2 wheels);BSC/3in1    Recommendations for Other Services Rehab consult     Precautions / Restrictions Precautions Precautions:  Fall Recall of Precautions/Restrictions: Impaired Precaution/Restrictions Comments: Goal SBP 100-150, impulsive Restrictions Weight Bearing Restrictions Per Provider Order: No     Mobility  Bed Mobility Overal bed mobility: Needs Assistance Bed Mobility: Supine to Sit     Supine to sit: Contact guard, HOB elevated     General bed mobility comments: CGA for safety when transitioning supine to sit R EOB from elevated HOB    Transfers Overall transfer level: Needs assistance Equipment used: Rolling walker (2 wheels) Transfers: Sit to/from Stand Sit to Stand: Min assist           General transfer comment: Slight posterior lean initially upon standing, needing minA to shift weight anteriorly and maintain balance    Ambulation/Gait Ambulation/Gait assistance: Min assist, Mod assist, Contact guard assist Gait Distance (Feet): 300 Feet Assistive device: Rolling walker (2 wheels), None Gait Pattern/deviations: Step-through pattern, Decreased stride length, Drifts right/left, Staggering right, Leaning posteriorly, Trunk flexed Gait velocity: decreased Gait velocity interpretation: <1.8 ft/sec, indicate of risk for recurrent falls   General Gait Details: Pt ambulated initially with the RW, needing CGA-minA for balance and cuing to avoid bumping objects (more so on her L than R). Pt then progressed to no UE support, but took smaller, more unsteady steps, staggering more posteriorly than laterally, needing min-modA to recover at times. Cues provided to try to place more weight in the anterior aspect of her foot to try to encourage anterior weight shift. Pt was challenged to change head positions, directions, and speeds along with navigate in/out of obstacles when using the RW then again when not using an AD. Pt would slow her gait with changes in head positions  and weaving in/out of obstacles. She was able to change speeds well, but when cued to turn around quickly and stop she would lose  her balance when using RW or not and need min-modA to recover.   Stairs Stairs: Yes Stairs assistance: Min assist Stair Management: Step to pattern, Alternating pattern, Forwards, One rail Left, One rail Right Number of Stairs: 11 General stair comments: Pt using L rail ascending and R rail descending. Pt initially ascending with alternating step pattern, often swaying and losing balance posteriorly. Cues provided for pt to stop, identify the issue, and correct it. She needed frequent reminders to slow down, place feet more on the step rather than heel hanging off the lip of the step, shift weight anteriorly, and switch to step-to pattern. Pt adhered to step-to pattern better when descending, but still would sway posteriorly, needing cues to pause and correct her balance before proceeding to the next step. MinA for balance needed.   Wheelchair Mobility     Tilt Bed    Modified Rankin (Stroke Patients Only) Modified Rankin (Stroke Patients Only) Pre-Morbid Rankin Score: No significant disability Modified Rankin: Moderately severe disability     Balance Overall balance assessment: Needs assistance Sitting-balance support: No upper extremity supported, Feet supported Sitting balance-Leahy Scale: Good   Postural control: Right lateral lean, Posterior lean Standing balance support: Single extremity supported, Bilateral upper extremity supported, During functional activity, No upper extremity supported Standing balance-Leahy Scale: Poor Standing balance comment: Pt often staggering posteriorly, needing up to min-modA to recover without UE support but up to minA to recover when using RW               High Level Balance Comments: Cued pt to reach to floor to get glove off ground 4x without UE support, pt staggered posteriorly and needed minA to recover and cues to widen stance to try to improve balance Standardized Balance Assessment Standardized Balance Assessment : Dynamic Gait  Index   Dynamic Gait Index Level Surface: Moderate Impairment (without RW; 2 with RW) Change in Gait Speed: Mild Impairment (with and without RW) Gait with Horizontal Head Turns: Moderate Impairment (without RW; 2 with RW) Gait with Vertical Head Turns: Moderate Impairment (without RW; 2 with RW) Gait and Pivot Turn: Severe Impairment (with and without RW) Step Over Obstacle: Moderate Impairment (did not test, 1/3 carried over from prior session) Step Around Obstacles: Moderate Impairment (with and without RW) Steps: Severe Impairment (with and without RW; DGI score of 10 when using RW, score of 7 when not using an AD) Total Score: 7      Communication Communication Communication: No apparent difficulties  Cognition Arousal: Alert Behavior During Therapy: Impulsive   PT - Cognitive impairments: Awareness, Memory, Sequencing, Problem solving, Safety/Judgement                       PT - Cognition Comments: Pt A&Ox4. However, pt demonstrates deficits in memory, awareness, and problem solving. She needs step-by-step cues as she often easily gets distracted. She is still impulsive with little safety awareness. Educated pt that she should not get up on her own ~5x then as soon as therapist would say Now if you needed to just get up and use the bathroom can you do that without help? she would say yeah and try to get up each time. Pt restlessly messing with lines, needing cues to let them go and let therapist manage lines. Able to identify balance deficits when cued,  but needs cues to slow down and correct them to improve her safety Following commands: Impaired Following commands impaired: Follows one step commands with increased time, Follows multi-step commands inconsistently, Only follows one step commands consistently    Cueing Cueing Techniques: Verbal cues, Tactile cues  Exercises      General Comments General comments (skin integrity, edema, etc.): VSS on RA       Pertinent Vitals/Pain Pain Assessment Pain Assessment: Faces Faces Pain Scale: No hurt Pain Intervention(s): Monitored during session    Home Living                          Prior Function            PT Goals (current goals can now be found in the care plan section) Acute Rehab PT Goals Patient Stated Goal: return home PT Goal Formulation: With patient/family Time For Goal Achievement: 07/04/24 Potential to Achieve Goals: Good Progress towards PT goals: Progressing toward goals    Frequency    Min 3X/week      PT Plan      Co-evaluation              AM-PAC PT 6 Clicks Mobility   Outcome Measure  Help needed turning from your back to your side while in a flat bed without using bedrails?: A Little Help needed moving from lying on your back to sitting on the side of a flat bed without using bedrails?: A Little Help needed moving to and from a bed to a chair (including a wheelchair)?: A Little Help needed standing up from a chair using your arms (e.g., wheelchair or bedside chair)?: A Little Help needed to walk in hospital room?: A Lot Help needed climbing 3-5 steps with a railing? : A Little 6 Click Score: 17    End of Session Equipment Utilized During Treatment: Gait belt Activity Tolerance: Patient tolerated treatment well Patient left: in chair;with call bell/phone within reach;with chair alarm set;with family/visitor present;Other (comment) (with fall mat down on ground)   PT Visit Diagnosis: Unsteadiness on feet (R26.81);Difficulty in walking, not elsewhere classified (R26.2);Other abnormalities of gait and mobility (R26.89);Muscle weakness (generalized) (M62.81);Other symptoms and signs involving the nervous system (R29.898)     Time: 8575-8545 PT Time Calculation (min) (ACUTE ONLY): 30 min  Charges:    $Gait Training: 23-37 mins PT General Charges $$ ACUTE PT VISIT: 1 Visit                     Theo Ferretti, PT, DPT Acute  Rehabilitation Services  Office: [phone]    Theo CHRISTELLA Ferretti 06/21/2024, 3:45 PM

## 2024-06-21 NOTE — Progress Notes (Addendum)
 STROKE TEAM PROGRESS NOTE    SIGNIFICANT HOSPITAL EVENTS 6/24-patient admitted with left frontal lobe ICH, briefly given hypertonic saline but this is now been discontinued 6/26-head CT shows slight expansion of hematoma, but patient looks good clinically.  Will transfer out of the ICU and obtain repeat head CT in the morning.  INTERIM HISTORY/SUBJECTIVE  Patient continues to be hemodynamically stable and afebrile, and her neurological exam is stable.  Head CT this morning shows slight expansion of hematoma to 13 mL, will repeat CT tomorrow morning, as patient looks good clinically.  OBJECTIVE  CBC    Component Value Date/Time   WBC 6.5 06/21/2024 0443   RBC 3.69 (L) 06/21/2024 0443   HGB 11.3 (L) 06/21/2024 0443   HCT 35.0 (L) 06/21/2024 0443   PLT 241 06/21/2024 0443   MCV 94.9 06/21/2024 0443   MCH 30.6 06/21/2024 0443   MCHC 32.3 06/21/2024 0443   RDW 12.7 06/21/2024 0443   LYMPHSABS 1.5 06/19/2024 0929   MONOABS 0.7 06/19/2024 0929   EOSABS 0.0 06/19/2024 0929   BASOSABS 0.1 06/19/2024 0929    BMET    Component Value Date/Time   NA 140 06/21/2024 0443   K 4.1 06/21/2024 0443   CL 107 06/21/2024 0443   CO2 22 06/21/2024 0443   GLUCOSE 118 (H) 06/21/2024 0443   BUN 15 06/21/2024 0443   CREATININE 0.61 06/21/2024 0443   CALCIUM  8.7 (L) 06/21/2024 0443   GFRNONAA >60 06/21/2024 0443    IMAGING past 24 hours CT HEAD WO CONTRAST ( ) Result Date: 06/21/2024 EXAM: CT HEAD WITHOUT 06/21/2024 05:32:42 AM TECHNIQUE: CT of the head was performed without the administration of intravenous contrast. Automated exposure control, iterative reconstruction, and/or weight based adjustment of the mA/kV was utilized to reduce the radiation dose to as low as reasonably achievable. COMPARISON: CT head without contrast 06/20/2024. CLINICAL HISTORY: Stroke, hemorrhagic. Chief complaints; Neurologic Problem. FINDINGS: BRAIN AND VENTRICLES: Intraparenchymal hemorrhage. The left frontal at  the anterior left frontal parafalcine intraparenchymal hemorrhage continues to increase in size, now measuring 4.8 x 2.6 x 2.7 cm. Subdural blood along the falx is stable. Local mass effect with effacement of the sulci and slight subfalcine herniation is stable. No new hemorrhage is present. Periventricular white matter changes are otherwise stable. ORBITS: No acute abnormality. SINUSES AND MASTOIDS: No acute abnormality. SOFT TISSUES AND SKULL: No acute skull fracture. No acute soft tissue abnormality. VASCULATURE: Atherosclerotic calcifications are present in the cavernous carotid arteries bilaterally. No hyperdense vessel is present. IMPRESSION: 1. Increasing size of the left frontal parafalcine intraparenchymal hemorrhage, now measuring 4.8 x 2.6 x 2.7 cm. 2. Stable subdural blood along the falx. 3. Stable local mass effect with effacement of the sulci and slight subfalcine herniation. Electronically signed by: Lonni Necessary MD 06/21/2024 05:57 AM EDT RP Workstation: [id number]    Vitals:   06/21/24 0600 06/21/24 0700 06/21/24 0800 06/21/24 1000  BP: 125/70 109/80  122/62  Pulse: 67 64  68  Resp: 18 14    Temp:   98.3 F (36.8 C)   TempSrc:   Oral   SpO2: 96% 97%    Weight:      Height:         PHYSICAL EXAM General:  Alert, well-nourished, well-developed patient in no acute distress Psych:  Mood and affect appropriate for situation CV: Regular rate and rhythm on monitor Respiratory:  Regular, unlabored respirations on room air   NEURO:  Mental Status: AA&Ox3, patient is able to give clear and  coherent history, does report some recent memory problems Speech/Language: speech is without dysarthria or aphasia.    Cranial Nerves:  II: PERRL.  III, IV, VI: EOMI. Eyelids elevate symmetrically.  V: Sensation is intact to light touch and symmetrical to face.  VII: Face is symmetrical resting and smiling VIII: hearing intact to voice. IX, X: Phonation is normal.  XII: tongue is  midline without fasciculations. Motor: Able to move all 4 extremities with good antigravity strength Tone: is normal and bulk is normal Sensation- Intact to light touch bilaterally. Coordination: FTN intact bilaterally Gait- deferred  Most Recent NIH 0   ASSESSMENT/PLAN  Ms. ESMERELDA FINNIGAN is a 78 y.o. female with history of TIAs versus amyloid spells, stroke, ICH and possible cerebral amyloid angiopathy admitted for gait instability and right-sided weakness.  Patient was found on CT to have acute 3.5 cc IPH in anterior medial left frontal lobe as well as subdural hemorrhage along the left aspect of the falx.  Patient reports some recent memory problems and has been seen by neurology for this, and there is also a question of cerebral amyloid angiopathy.  She has had some expansion of hematoma on repeat head CT but looks good clinically.  Will transfer out of the ICU today.  NIH on Admission 1  ICH: Left frontal lobe IPH with falcine SDH, etiology: Likely cerebral amyloid angiopathy Code Stroke CT head 3.5 mm acute IPH within anteromedial left frontal lobe, acute subdural hemorrhage along the left aspect of the falx CTA head & neck LVO, aneurysm or AVM CTV no venous thrombosis MRI left frontal lobe hematoma without significant interval change, stable left parafalcine subdural hematoma, chronic hemosiderin staining within left precentral sulcus from remote subarachnoid hemorrhage Repeat CT 6/25 increased size of anterior medial left frontal lobe hemorrhage with some vasogenic edema and local mass effect, stable parafalcine subdural hemorrhage CT repeat 6/26 slightly increased size of left frontal parafalcine IPH to 13 mm CT repeat 6/27 pending 2D Echo EF 55 to 60%, normal left atrial size, no atrial level shunt LDL 45 HgbA1c 5.3 UDS negative VTE prophylaxis -SCDs No antithrombotic prior to admission, now on No antithrombotic due to IPH and CAA Therapy recommendation CIR Disposition:  Pending  Hx of Stroke/TIA/ICH CAA 06/2023 admitted for right upper extremity heaviness x 2.  MRI showed left frontal tiny infarct, left parietal sulcal SAH.  Concerning for CAA with amyloid spells.  EF 60 to 65%.  LDL 130, A1c 5.4.  Discharged on aspirin  81 and Crestor  10.  Hypertension Home meds: Losartan  50 mg daily Stable Long-term BP goal less than 140 given CAA  Hyperlipidemia Home meds: Rosuvastatin  5 mg daily LDL 45, goal < 70 Patient and family will discuss participation in [place] trial, ICF given to read  Other Stroke Risk Factors None  Other Active Problems Hematuria noted by RN, urinalysis ordered Mild cognitive impairment since 06/2023 Mild leukocytosis WBC 12.5  Hospital day # 2  Patient seen by NP with MD, MD to edit note as needed. Cortney E Everitt Clint Kill , MSN, AGACNP-BC [facility] See Amion for schedule and pager information 06/21/2024 10:37 AM  ATTENDING NOTE: I reviewed above note and agree with the assessment and plan. Pt was seen and examined.   Pt neuro stable, no complains, no HA. Daughter and husband are at the bedside. However, CT repeat continued to show small increase of hematoma volume. Pt on diet, eat well, will check CT again in am. BP stable, famiily will discuss about  [place] trial.   For detailed assessment and plan, please refer to above as I have made changes wherever appropriate.   Ary Cummins, MD PhD Stroke Neurology 06/21/2024 6:39 PM    This patient is critically ill due to ICH, SDH, CAA and at significant risk of neurological worsening, death form recurrent hemorrhage, tumor expansion, brain herniation. This patient's care requires constant monitoring of vital signs, hemodynamics, respiratory and cardiac monitoring, review of multiple databases, neurological assessment, discussion with family, other specialists and medical decision making of high complexity. I spent 40 minutes of neurocritical care time in the care of this  patient. I had long discussion with patient and husband at bedside, updated pt current condition, treatment plan and potential prognosis, and answered all the questions.  They expressed understanding and appreciation.    To contact Stroke Continuity provider, please refer to [email or web address]. After hours, contact General Neurology

## 2024-06-21 NOTE — Progress Notes (Signed)
 Inpatient Rehab Admissions Coordinator:   Consult received and chart reviewed.  Discussed with therapy.  Pt mobilizing well, but still increased risk of falls due to poor balance and safety awareness.  Still appropriate for CIR.  We will not have beds available on CIR until sometime next week.  I discussed with Kristi Webster, RNCM so potential other AIRs could be pursued.  I will sign off for CIR at this time.   Reche Lowers, PT, DPT Admissions Coordinator [phone] 06/21/24  3:07 PM

## 2024-06-22 ENCOUNTER — Inpatient Hospital Stay (HOSPITAL_COMMUNITY)

## 2024-06-22 DIAGNOSIS — I619 Nontraumatic intracerebral hemorrhage, unspecified: Secondary | ICD-10-CM | POA: Diagnosis not present

## 2024-06-22 DIAGNOSIS — G936 Cerebral edema: Secondary | ICD-10-CM | POA: Diagnosis not present

## 2024-06-22 DIAGNOSIS — I6523 Occlusion and stenosis of bilateral carotid arteries: Secondary | ICD-10-CM | POA: Diagnosis not present

## 2024-06-22 DIAGNOSIS — I611 Nontraumatic intracerebral hemorrhage in hemisphere, cortical: Secondary | ICD-10-CM | POA: Diagnosis not present

## 2024-06-22 DIAGNOSIS — E859 Amyloidosis, unspecified: Secondary | ICD-10-CM | POA: Diagnosis not present

## 2024-06-22 DIAGNOSIS — G935 Compression of brain: Secondary | ICD-10-CM | POA: Diagnosis not present

## 2024-06-22 DIAGNOSIS — R29700 NIHSS score 0: Secondary | ICD-10-CM | POA: Diagnosis not present

## 2024-06-22 DIAGNOSIS — I6200 Nontraumatic subdural hemorrhage, unspecified: Secondary | ICD-10-CM | POA: Diagnosis not present

## 2024-06-22 LAB — CBC
HCT: 39 % (ref 36.0–46.0)
Hemoglobin: 12.8 g/dL (ref 12.0–15.0)
MCH: 30.8 pg (ref 26.0–34.0)
MCHC: 32.8 g/dL (ref 30.0–36.0)
MCV: 94 fL (ref 80.0–100.0)
Platelets: 264 10*3/uL (ref 150–400)
RBC: 4.15 MIL/uL (ref 3.87–5.11)
RDW: 12.4 % (ref 11.5–15.5)
WBC: 6.6 10*3/uL (ref 4.0–10.5)
nRBC: 0 % (ref 0.0–0.2)

## 2024-06-22 LAB — BASIC METABOLIC PANEL WITH GFR
Anion gap: 11 (ref 5–15)
BUN: 12 mg/dL (ref 8–23)
CO2: 28 mmol/L (ref 22–32)
Calcium: 9.8 mg/dL (ref 8.9–10.3)
Chloride: 103 mmol/L (ref 98–111)
Creatinine, Ser: 0.68 mg/dL (ref 0.44–1.00)
GFR, Estimated: >60 mL/min (ref >60)
Glucose, Bld: 104 mg/dL — ABNORMAL HIGH (ref 70–99)
Potassium: 4.2 mmol/L (ref 3.5–5.1)
Sodium: 142 mmol/L (ref 135–145)

## 2024-06-22 MED ORDER — SODIUM CHLORIDE 0.9 % IV SOLN: INTRAVENOUS | Status: DC

## 2024-06-22 NOTE — TOC CAGE-AID Note (Signed)
 Transition of Care [facility]) - CAGE-AID Screening   Patient Details  Name: Chelsea Barnett MRN: [id number] Date of Birth: August 06, 1946  Transition of Care [facility]) CM/SW Contact:    Kin Galbraith E Annette Bertelson, LCSW Phone Number: 06/22/2024, 10:03 AM   Clinical Narrative:    CAGE-AID Screening:    Have You Ever Felt You Ought to Cut Down on Your Drinking or Drug Use?: No Have People Annoyed You By [employer] Your Drinking Or Drug Use?: No Have You Felt Bad Or Guilty About Your Drinking Or Drug Use?: No Have You Ever Had a Drink or Used Drugs First Thing In The Morning to Steady Your Nerves or to Get Rid of a Hangover?: No CAGE-AID Score: 0  Substance Abuse Education Offered: No

## 2024-06-22 NOTE — Progress Notes (Signed)
 Physical Therapy Treatment Patient Details Name: Chelsea Barnett MRN: [id number] DOB: 1946/01/12 Today's Date: 06/22/2024   History of Present Illness The pt is a 78 yo female presenting 6/24 with transient R-sided weakness. CT showed 3.5cc acute parenchymal hemorrhage in anterior medial L frontal lobe and acute SDH along L aspect of falx without midline shift. PMH includes: SAH of L frontal and parietal lobes, infarcts of L frontal lobe, migraines, psoriasis, and skin cancer.    PT Comments  Pt making steady progress with mobility and balance. Remains high fall risk due to her impaired cognition in combination with balance and mobility deficits. Worked today on many high level balance activities and used Clock Yourself app to work dual tasking with balance and cognitive activity. Continue to feel patient will benefit from intensive inpatient follow-up therapy, >3 hours/day.     If plan is discharge home, recommend the following: A lot of help with bathing/dressing/bathroom;Assistance with cooking/housework;Direct supervision/assist for medications management;Direct supervision/assist for financial management;Assist for transportation;Help with stairs or ramp for entrance;Supervision due to cognitive status;A little help with walking and/or transfers   Can travel by private vehicle        Equipment Recommendations  Rolling walker (2 wheels);BSC/3in1    Recommendations for Other Services       Precautions / Restrictions Precautions Precautions: Fall Recall of Precautions/Restrictions: Impaired Precaution/Restrictions Comments: Goal SBP 100-150, impulsive Restrictions Weight Bearing Restrictions Per Provider Order: No     Mobility  Bed Mobility               General bed mobility comments: Pt up in chair    Transfers Overall transfer level: Needs assistance Equipment used: None Transfers: Sit to/from Stand Sit to Stand: Contact guard assist, Min assist            General transfer comment: Performed multiple sit to stands and at times required only CGA for safety but on others required min assist for balance.    Ambulation/Gait Ambulation/Gait assistance: Min assist Gait Distance (Feet): 225 Feet Assistive device: None Gait Pattern/deviations: Step-through pattern, Decreased stride length, Drifts right/left, Trunk flexed Gait velocity: decreased Gait velocity interpretation: <1.8 ft/sec, indicate of risk for recurrent falls   General Gait Details: Did not use assistive device. Needs min assist for balance at times with intermittent stagger or drifting.   Stairs             Wheelchair Mobility     Tilt Bed    Modified Rankin (Stroke Patients Only) Modified Rankin (Stroke Patients Only) Pre-Morbid Rankin Score: No significant disability Modified Rankin: Moderately severe disability     Balance Overall balance assessment: Needs assistance Sitting-balance support: No upper extremity supported, Feet supported Sitting balance-Leahy Scale: Good     Standing balance support: Single extremity supported, Bilateral upper extremity supported, During functional activity, No upper extremity supported Standing balance-Leahy Scale: Poor Standing balance comment: variable assist needed with static and dynamic activities             High level balance activites: Backward walking, Direction changes              Communication Communication Communication: No apparent difficulties  Cognition Arousal: Alert Behavior During Therapy: Impulsive   PT - Cognitive impairments: Awareness, Memory, Sequencing, Problem solving, Safety/Judgement                       PT - Cognition Comments: Continues to demonstrate decr awareness of deficits and impulsiveness regarding mobility. Following  commands: Impaired Following commands impaired: Follows one step commands with increased time, Follows multi-step commands inconsistently, Only  follows one step commands consistently    Cueing Cueing Techniques: Verbal cues, Tactile cues  Exercises Other Exercises Other Exercises: Repeat sit to stand x 10 without UE assist Other Exercises: Crossover step to targert with min assist for balance x 20 reps Other Exercises: Backward walking 5' x 8 with min assist Other Exercises: Single leg stance with 1 foot on  cup Other Exercises: Clock Yourself app x 2 minutes at 30 spm    General Comments General comments (skin integrity, edema, etc.): VSS on RA      Pertinent Vitals/Pain Pain Assessment Pain Assessment: No/denies pain    Home Living                          Prior Function            PT Goals (current goals can now be found in the care plan section) Acute Rehab PT Goals Patient Stated Goal: return home Progress towards PT goals: Progressing toward goals    Frequency    Min 3X/week      PT Plan      Co-evaluation              AM-PAC PT 6 Clicks Mobility   Outcome Measure  Help needed turning from your back to your side while in a flat bed without using bedrails?: A Little Help needed moving from lying on your back to sitting on the side of a flat bed without using bedrails?: A Little Help needed moving to and from a bed to a chair (including a wheelchair)?: A Little Help needed standing up from a chair using your arms (e.g., wheelchair or bedside chair)?: A Little Help needed to walk in hospital room?: A Little Help needed climbing 3-5 steps with a railing? : A Little 6 Click Score: 18    End of Session Equipment Utilized During Treatment: Gait belt Activity Tolerance: Patient tolerated treatment well Patient left: in chair;with call bell/phone within reach;with chair alarm set;with family/visitor present Nurse Communication: Mobility status PT Visit Diagnosis: Unsteadiness on feet (R26.81);Difficulty in walking, not elsewhere classified (R26.2);Other abnormalities of gait and  mobility (R26.89);Muscle weakness (generalized) (M62.81);Other symptoms and signs involving the nervous system (R29.898)     Time: 8843-8779 PT Time Calculation (min) (ACUTE ONLY): 24 min  Charges:    $Gait Training: 23-37 mins PT General Charges $$ ACUTE PT VISIT: 1 Visit                     [facility] PT Acute Rehabilitation Services Office [phone]    Rodgers ORN [facility] 06/22/2024, 1:17 PM

## 2024-06-22 NOTE — TOC Initial Note (Addendum)
 Transition of Care (TOC) - Initial/Assessment Note  Rayfield Gobble RN,BSN Transitions of Care Unit 4NP (Non Trauma)- RN Case Manager See Treatment Team for direct Phone #   Patient Details  Name: Chelsea Barnett MRN: [id number] Date of Birth: 1946-01-04  Transition of Care [facility]) CM/SW Contact:    Gobble Rayfield Hurst, RN Phone Number: 06/22/2024, 9:45 AM  Clinical Narrative:                 Received call from CIR liaison Reche, regarding inpt rehab admission- per [facility] INPT rehab will not likely have any bed available until mid next week- recommend reaching out to other AIR.   Call made to pt's spouse- [name]- discussed INPT rehab recommendations and lack of bed availability for [facility] INPT rehab. [name] expressed that he would prefer to stay at [facility], however gives permission for CM to reach out to [facility], and [facility] to check on bed availability-  Call made to [facility] liaison- per [facility] they have bed availability and can review if spouse wants.  Call also made to [facility] rehab- msg left and awaiting return call regarding bed availability.   CM will follow up with [name] in am regarding choice between alternate options.    Expected Discharge Plan: IP Rehab Facility Barriers to Discharge:  (Inpatient rehabs reviewing)   Patient Goals and CMS Choice Patient states their goals for this hospitalization and ongoing recovery are:: to go home CMS Medicare.gov Compare Post Acute Care list provided to:: Patient Represenative (must comment) Choice offered to / list presented to : Patient, Spouse [place] ownership interest in [facility].provided to:: Spouse    Expected Discharge Plan and Services In-house Referral: Clinical Social Work Discharge Planning Services: CM Consult Post Acute Care Choice: IP Rehab Living arrangements for the past 2 months: Single Family Home                                      Prior Living  Arrangements/Services Living arrangements for the past 2 months: Single Family Home Lives with:: Spouse Patient language and need for interpreter reviewed:: Yes Do you feel safe going back to the place where you live?: Yes      Need for Family Participation in Patient Care: Yes (Comment) Care giver support system in place?: Yes (comment)   Criminal Activity/Legal Involvement Pertinent to Current Situation/Hospitalization: No - Comment as needed  Activities of Daily Living   ADL Screening (condition at time of admission) Independently performs ADLs?: Yes (appropriate for developmental age) Is the patient deaf or have difficulty hearing?: No Does the patient have difficulty seeing, even when wearing glasses/contacts?: No Does the patient have difficulty concentrating, remembering, or making decisions?: No  Permission Sought/Granted Permission sought to share information with : Facility Medical sales representative, Family Supports Permission granted to share information with : Yes, Verbal Permission Granted  Share Information with NAME: [name]  Permission granted to share info w AGENCY: IR  Permission granted to share info w Relationship: Spouse  Permission granted to share info w Contact Information: [phone]  Emotional Assessment Appearance:: Appears stated age Attitude/Demeanor/Rapport: Engaged Affect (typically observed): Accepting Orientation: : Oriented to Self, Oriented to Place, Oriented to  Time, Oriented to Situation Alcohol  / Substance Use: Not Applicable Psych Involvement: No (comment)  Admission diagnosis:  Hemorrhagic stroke [facility]) [I61.9] Patient Active Problem List   Diagnosis Date Noted   Hemorrhagic stroke (  HCC) 06/19/2024   Right arm weakness 06/28/2023   CVA (cerebral vascular accident) (HCC) 06/28/2023   Essential hypertension 06/28/2023   History of skin cancer 06/28/2023   Nontraumatic subarachnoid hemorrhage (HCC) 06/28/2023   Chest pain on breathing  11/19/2013   Heart murmur, systolic 08/29/2013   Pure hypercholesterolemia 06/01/2012   Edema 06/01/2012   Weight loss 06/01/2012   Psoriasis 06/01/2012   PCP:  Loreli Elsie JONETTA Mickey., MD Pharmacy:   [facility] - [place], [place] - [address] [place] [address] Phone: [phone] Fax: [phone]  [facility] [phone] - [place] , [place] - [address] AT [place] [address] [place] [address] at [employer] [place]  [place] [address] Phone: [phone] Fax: [phone]     Social Drivers of Health (SDOH) Social History: SDOH Screenings   Food Insecurity: No Food Insecurity (06/19/2024)  Housing: Low Risk  (06/19/2024)  Transportation Needs: No Transportation Needs (06/19/2024)  Utilities: Not At Risk (06/19/2024)  Social Connections: Moderately Integrated (06/19/2024)  Tobacco Use: Low Risk  (06/19/2024)   SDOH Interventions:     Readmission Risk Interventions     No data to display

## 2024-06-22 NOTE — Progress Notes (Signed)
 Inpatient Rehab Admissions Coordinator:   Beds on CIR limited.  TOC working on referral to other rehab venues.    Reche Lowers, PT, DPT Admissions Coordinator [phone] 06/22/24  11:24 AM

## 2024-06-22 NOTE — Progress Notes (Signed)
 Occupational Therapy Treatment Patient Details Name: Chelsea Barnett MRN: [id number] DOB: 01/22/1946 Today's Date: 06/22/2024   History of present illness The pt is a 78 yo female presenting 6/24 with transient R-sided weakness. CT showed 3.5cc acute parenchymal hemorrhage in anterior medial L frontal lobe and acute SDH along L aspect of falx without midline shift. PMH includes: SAH of L frontal and parietal lobes, infarcts of L frontal lobe, migraines, psoriasis, and skin cancer.   OT comments  Patient with fair progress toward patient focused goals.  Attempted in room mobility at [facility] level.  Setup and general supervision for stand grooming, but up to Min A for balance recovery.  Remains a high fall risk, and given prior level of function and ability to participate, Patient will benefit from intensive inpatient follow-up therapy, >3 hours/day.  Patient demonstrates the ability to reach a Mod I level prior to returning home with aggressive rehab.  OT will continue efforts in the acute setting to address deficits listed below.        If plan is discharge home, recommend the following:  A little help with walking and/or transfers;A little help with bathing/dressing/bathroom;Help with stairs or ramp for entrance;Assist for transportation;Assistance with cooking/housework;Supervision due to cognitive status   Equipment Recommendations       Recommendations for Other Services      Precautions / Restrictions Precautions Precautions: Fall Recall of Precautions/Restrictions: Impaired Precaution/Restrictions Comments: Goal SBP 100-150, impulsive Restrictions Weight Bearing Restrictions Per Provider Order: No       Mobility Bed Mobility Overal bed mobility: Needs Assistance Bed Mobility: Supine to Sit     Supine to sit: Supervision          Transfers Overall transfer level: Needs assistance Equipment used: 1 person hand held assist Transfers: Sit to/from Stand, Bed to  chair/wheelchair/BSC Sit to Stand: Contact guard assist, Supervision     Step pivot transfers: Min assist           Balance Overall balance assessment: Needs assistance Sitting-balance support: No upper extremity supported, Feet supported Sitting balance-Leahy Scale: Good Sitting balance - Comments: supervision due to impulsivity   Standing balance support: Single extremity supported Standing balance-Leahy Scale: Poor Standing balance comment: continues with LOB                           ADL either performed or assessed with clinical judgement   ADL       Grooming: Oral care;Supervision/safety;Standing           Upper Body Dressing : Set up;Sitting   Lower Body Dressing: Contact guard assist;Sit to/from stand   Toilet Transfer: Minimal assistance;Ambulation;Regular Toilet                  Extremity/Trunk Assessment Upper Extremity Assessment Upper Extremity Assessment: Overall WFL for tasks assessed;Right hand dominant RUE Deficits / Details: 4+/5 overall strength. Gross grasp functional.   Lower Extremity Assessment Lower Extremity Assessment: Defer to PT evaluation   Cervical / Trunk Assessment Cervical / Trunk Assessment: Normal    Vision   Vision Assessment?: No apparent visual deficits   Perception Perception Perception: Not tested   Praxis Praxis Praxis: Not tested   Communication Communication Communication: No apparent difficulties   Cognition Arousal: Alert Behavior During Therapy: Impulsive Cognition: Cognition impaired       Memory impairment (select all impairments): Short-term memory   Executive functioning impairment (select all impairments): Organization, Initiation, Reasoning, Problem solving  Following commands: Impaired Following commands impaired: Follows multi-step commands inconsistently      Cueing   Cueing Techniques: Verbal cues, Tactile cues  Exercises      Shoulder  Instructions       General Comments  VSS on RA    Pertinent Vitals/ Pain       Pain Assessment Pain Assessment: No/denies pain Pain Intervention(s): Monitored during session                                                          Frequency  Min 2X/week        Progress Toward Goals  OT Goals(current goals can now be found in the care plan section)  Progress towards OT goals: Progressing toward goals  Acute Rehab OT Goals OT Goal Formulation: With patient Time For Goal Achievement: 07/04/24 Potential to Achieve Goals: Good  Plan      Co-evaluation                 AM-PAC OT 6 Clicks Daily Activity     Outcome Measure   Help from another person eating meals?: None Help from another person taking care of personal grooming?: A Little Help from another person toileting, which includes using toliet, bedpan, or urinal?: A Little Help from another person bathing (including washing, rinsing, drying)?: A Little Help from another person to put on and taking off regular upper body clothing?: None Help from another person to put on and taking off regular lower body clothing?: A Little 6 Click Score: 20    End of Session Equipment Utilized During Treatment: Gait belt  OT Visit Diagnosis: Muscle weakness (generalized) (M62.81);Hemiplegia and hemiparesis Hemiplegia - Right/Left: Right Hemiplegia - dominant/non-dominant: Dominant Hemiplegia - caused by: Other Nontraumatic intracranial hemorrhage   Activity Tolerance Patient tolerated treatment well   Patient Left in chair;with call bell/phone within reach;with chair alarm set;with family/visitor present   Nurse Communication Mobility status        Time: 1130-1150 OT Time Calculation (min): 20 min  Charges: OT General Charges $OT Visit: 1 Visit OT Treatments $Self Care/Home Management : 8-22 mins  06/22/2024  RP, OTR/L  Acute Rehabilitation Services  Office:   [phone]   Chelsea Barnett 06/22/2024, 12:22 PM

## 2024-06-22 NOTE — Discharge Summary (Signed)
 Physician Discharge Summary   Patient: Chelsea Barnett MRN: [id number] DOB: August 10, 1946  Admit date:     06/19/2024  Discharge date: 06/22/24  Discharge Physician: Lonni SHAUNNA Dalton   PCP: Loreli Elsie JONETTA Mickey., MD     Recommendations at discharge:  Follow up with Neurology for stroke in 4-8 weeks     Discharge Diagnoses: Principal Problem:   Left frontal lobe intraparenchymal hemorrhage Other hospital problems:   Fall seen subdural hematoma   Cerebral amyloid angiopathy   Cerebrovascular disease   Hypertension   Hyperlipidemia   Mild cognitive impairment       Hospital Course: Chelsea Barnett is a 78 y.o. F with hx TIA versus amyloid spells, history stroke, ICH and possible CAA who presented with acute gait instability and right-sided weakness.  In the ER she was found to have 3.5 mm IPH in the anterior left frontal lobe as well as subdural hematoma along the left aspect of the falx.  Patient reported recent memory problems for which she is being followed by neurology and there has been some question of cerebral amyloid angiopathy.      Left frontal lobe IPH with falcine SDH, etiology likely CAA Cerebrovascular disease The patient was admitted and CT head showed 3.5 mm acute IPH within anteromedial left frontal lobe, acute subdural hemorrhage along the left aspect of the falx.  CTA head and neck showed no LVO, no carotid disease.  CT venogram showed no venous thrombosis MRI of the brain confirmed frontal lobe hematoma, subdural hematoma, and chronic hemosiderin staining within the left precentral sulcus from remote SAH.  She was monitored in the ICU, repeat CT head on hospital day 2 showed increased size of the anterior medial left frontal lobe hemorrhage with some vasogenic edema and local mass effect, but clinically she appeared well and this was monitored expectantly.  Repeat CT on 6/26 again showed slight interval increase in size, but again appeared clinically  well.  Repeat CT on 6/27 showed slight decrease in left frontal lobe hematoma.    Aspirin  held at discharge.  Transferred to inpatient rehab.    Hypertension Blood pressure controlled on losartan  and metoprolol   Hyperlipidemia Stable on Crestor   Hematuria Urinalysis showed no hematuria, no leukocytosis.  Mild cognitive impairment Since 2024, July.  Follows with neurology          The [place]  Controlled Substances Registry was reviewed for this patient prior to discharge.  Consultants: Neurology   Disposition: Inpatient rehab   DISCHARGE MEDICATION: Allergies as of 06/22/2024   No Known Allergies      Medication List     PAUSE taking these medications    Aspirin  Low Dose 81 MG chewable tablet Wait to take this until your doctor or other care provider tells you to start again. Generic drug: aspirin  Chew 1 tablet (81 mg total) by mouth daily.       STOP taking these medications    traZODone 50 MG tablet Commonly known as: DESYREL       TAKE these medications    acetaminophen  325 MG tablet Commonly known as: TYLENOL  Take 650 mg by mouth daily as needed for headache, mild pain (pain score 1-3) or moderate pain (pain score 4-6).   losartan  50 MG tablet Commonly known as: COZAAR  Take 1 tablet (50 mg total) by mouth in the morning AND 0.5 tablets (25 mg total) every evening.   metoprolol  succinate 25 MG 24 hr tablet Commonly known as: Toprol  XL Take 1 tablet (  25 mg total) by mouth daily.   mirtazapine  30 MG tablet Commonly known as: REMERON  Take 30 mg by mouth at bedtime.   Otezla 30 MG Tabs Generic drug: Apremilast Take 15 mg by mouth in the morning and at bedtime.   rosuvastatin  10 MG tablet Commonly known as: CRESTOR  Take 0.5 tablets (5 mg total) by mouth daily.   triamcinolone cream 0.1 % Commonly known as: KENALOG Apply 1 Application topically as needed (psoriasis).   Vitamin D  (Ergocalciferol ) 1.25 MG (50000 UNIT) Caps  capsule Commonly known as: DRISDOL  Take 50,000 Units by mouth every 7 (seven) days.        Follow-up Information     Loreli Elsie JONETTA Mickey., MD Follow up.   Specialty: Internal Medicine Contact information: [address] [place] [address] [phone]         [facility] Follow up.   Contact information: [address]     [address] [place]  [address] [phone]                Discharge Instructions     Discharge instructions   Complete by: As directed    You were admitted for a small intracranial bleeding. Your imaging showed a 3.5 mm hemorrhage within anteromedial left frontal lobe, and small subdural hemorrhage along the left aspect of the falx  You were treated supportively and your symptoms appear stable  STOP aspirin  for now  Continue your other home medicines  Go see Dr. Rosemarie in 4-6 weeks and discuss any further changes to your medicines       Discharge Exam: Filed Weights   06/19/24 0915  Weight: 56.7 kg    General: Pt is alert, awake, not in acute distress Cardiovascular: RRR, nl S1-S2, no murmurs appreciated.   No LE edema.   Respiratory: Normal respiratory rate and rhythm.  CTAB without rales or wheezes. Abdominal: Abdomen soft and non-tender.  No distension or HSM.   Neuro/Psych: Strength symmetric in upper and lower extremities.  Judgment and insight appear normal.   Condition at discharge: good  The results of significant diagnostics from this hospitalization (including imaging, microbiology, ancillary and laboratory) are listed below for reference.   Imaging Studies: CT HEAD WO CONTRAST ( ) Result Date: 06/22/2024 EXAM: CT HEAD WITHOUT 06/22/2024 06:30:09 AM TECHNIQUE: CT of the head was performed without the administration of intravenous contrast. Automated exposure control, iterative reconstruction, and/or weight based adjustment of the mA/kV was utilized to reduce the radiation  dose to as low as reasonably achievable. COMPARISON: CT head without contrast 06/21/2024 and 06/20/2024. CLINICAL HISTORY: Stroke, hemorrhagic. Stroke, hemorrhagic follow up. FINDINGS: BRAIN AND VENTRICLES: The hyperdense medial anterior left frontal lobe hemorrhage is stable to slightly decreased in size measuring 4.7 x 2.4 x 2.6 cm. Surrounding vasogenic edema is again noted. Minimal subfalcine herniation is present. Subdural blood along the falx is stable. No new hemorrhage is present. Periventricular white matter disease is stable. No mass effect or midline shift. No extra-axial fluid collection. Gray-white differentiation is maintained. No hydrocephalus. ORBITS: No acute abnormality. SINUSES AND MASTOIDS: No acute abnormality. SOFT TISSUES AND SKULL: No acute skull fracture. No acute soft tissue abnormality. VASCULATURE: Atherosclerotic calcifications are present in the cavernous carotid arteries bilaterally. No hyperdense vessel is present. IMPRESSION: 1. Stable to slightly decreased hyperdense medial anterior left frontal lobe hemorrhage measuring 4.7 x 2.4 x 2.6 cm with surrounding vasogenic edema and minimal subfalcine herniation. 2. Stable subdural blood along the falx. 3. No new hemorrhage.  Electronically signed by: Lonni Necessary MD 06/22/2024 06:40 AM EDT RP Workstation: [id number]   CT HEAD WO CONTRAST ( ) Result Date: 06/21/2024 EXAM: CT HEAD WITHOUT 06/21/2024 05:32:42 AM TECHNIQUE: CT of the head was performed without the administration of intravenous contrast. Automated exposure control, iterative reconstruction, and/or weight based adjustment of the mA/kV was utilized to reduce the radiation dose to as low as reasonably achievable. COMPARISON: CT head without contrast 06/20/2024. CLINICAL HISTORY: Stroke, hemorrhagic. Chief complaints; Neurologic Problem. FINDINGS: BRAIN AND VENTRICLES: Intraparenchymal hemorrhage. The left frontal at the anterior left frontal parafalcine  intraparenchymal hemorrhage continues to increase in size, now measuring 4.8 x 2.6 x 2.7 cm. Subdural blood along the falx is stable. Local mass effect with effacement of the sulci and slight subfalcine herniation is stable. No new hemorrhage is present. Periventricular white matter changes are otherwise stable. ORBITS: No acute abnormality. SINUSES AND MASTOIDS: No acute abnormality. SOFT TISSUES AND SKULL: No acute skull fracture. No acute soft tissue abnormality. VASCULATURE: Atherosclerotic calcifications are present in the cavernous carotid arteries bilaterally. No hyperdense vessel is present. IMPRESSION: 1. Increasing size of the left frontal parafalcine intraparenchymal hemorrhage, now measuring 4.8 x 2.6 x 2.7 cm. 2. Stable subdural blood along the falx. 3. Stable local mass effect with effacement of the sulci and slight subfalcine herniation. Electronically signed by: Lonni Necessary MD 06/21/2024 05:57 AM EDT RP Workstation: [id number]   ECHOCARDIOGRAM COMPLETE Result Date: 06/20/2024    ECHOCARDIOGRAM REPORT   Patient Name:   Chelsea Barnett Date of Exam: 06/20/2024 Medical Rec #:  [id number]           Height:       63.0 in Accession #:    [id number]          Weight:       125.0 lb Date of Birth:  [birth year]           BSA:          1.584 m Patient Age:    78 years            BP:           124/81 mmHg Patient Gender: F                   HR:           66 bpm. Exam Location:  Inpatient Procedure: 2D Echo, Color Doppler and Cardiac Doppler (Both Spectral and Color            Flow Doppler were utilized during procedure). Indications:    Stroke  History:        Patient has prior history of Echocardiogram examinations, most                 recent 06/28/2023.  Sonographer:    Benard Stallion Referring Phys: [id number] ASHISH ARORA IMPRESSIONS  1. Left ventricular ejection fraction, by estimation, is 55 to 60%. The left ventricle has normal function. The left ventricle has no regional wall motion  abnormalities. Left ventricular diastolic parameters were normal.  2. Right ventricular systolic function is normal. The right ventricular size is normal. There is normal pulmonary artery systolic pressure.  3. The mitral valve is normal in structure. Trivial mitral valve regurgitation. No evidence of mitral stenosis.  4. The aortic valve is normal in structure. Aortic valve regurgitation is mild. No aortic stenosis is present.  5. The inferior vena cava is normal in size with greater than 50% respiratory variability, suggesting right atrial  pressure of 3 mmHg. FINDINGS  Left Ventricle: Left ventricular ejection fraction, by estimation, is 55 to 60%. The left ventricle has normal function. The left ventricle has no regional wall motion abnormalities. The left ventricular internal cavity size was normal in size. There is  no left ventricular hypertrophy. Left ventricular diastolic parameters were normal. Right Ventricle: The right ventricular size is normal. No increase in right ventricular wall thickness. Right ventricular systolic function is normal. There is normal pulmonary artery systolic pressure. The tricuspid regurgitant velocity is 2.64 m/s, and  with an assumed right atrial pressure of 3 mmHg, the estimated right ventricular systolic pressure is 30.9 mmHg. Left Atrium: Left atrial size was normal in size. Right Atrium: Right atrial size was normal in size. Pericardium: There is no evidence of pericardial effusion. Mitral Valve: The mitral valve is normal in structure. Trivial mitral valve regurgitation. No evidence of mitral valve stenosis. Tricuspid Valve: The tricuspid valve is normal in structure. Tricuspid valve regurgitation is mild . No evidence of tricuspid stenosis. Aortic Valve: The aortic valve is normal in structure. Aortic valve regurgitation is mild. Aortic regurgitation PHT measures 506 msec. No aortic stenosis is present. Aortic valve mean gradient measures 7.0 mmHg. Aortic valve peak  gradient measures 12.5 mmHg. Aortic valve area, by VTI measures 2.86 cm. Pulmonic Valve: The pulmonic valve was normal in structure. Pulmonic valve regurgitation is mild. No evidence of pulmonic stenosis. Aorta: The aortic root is normal in size and structure. Venous: The inferior vena cava is normal in size with greater than 50% respiratory variability, suggesting right atrial pressure of 3 mmHg. IAS/Shunts: No atrial level shunt detected by color flow Doppler.  LEFT VENTRICLE PLAX 2D LVIDd:         4.70 cm   Diastology LVIDs:         2.80 cm   LV e' medial:    6.68 cm/s LV PW:         0.80 cm   LV E/e' medial:  10.9 LV IVS:        0.80 cm   LV e' lateral:   10.90 cm/s LVOT diam:     2.10 cm   LV E/e' lateral: 6.7 LV SV:         101 LV SV Index:   64 LVOT Area:     3.46 cm  RIGHT VENTRICLE RV Basal diam:  3.60 cm RV Mid diam:    3.30 cm RV S prime:     23.60 cm/s LEFT ATRIUM             Index        RIGHT ATRIUM           Index LA diam:        3.30 cm 2.08 cm/m   RA Area:     14.70 cm LA Vol (A2C):   41.4 ml 26.14 ml/m  RA Volume:   35.90 ml  22.67 ml/m LA Vol (A4C):   36.9 ml 23.30 ml/m LA Biplane Vol: 39.0 ml 24.63 ml/m  AORTIC VALVE AV Area (Vmax):    3.19 cm AV Area (Vmean):   2.74 cm AV Area (VTI):     2.86 cm AV Vmax:           177.00 cm/s AV Vmean:          118.667 cm/s AV VTI:            0.354 m AV Peak Grad:      12.5  mmHg AV Mean Grad:      7.0 mmHg LVOT Vmax:         163.00 cm/s LVOT Vmean:        93.800 cm/s LVOT VTI:          0.293 m LVOT/AV VTI ratio: 0.83 AI PHT:            506 msec  AORTA Ao Root diam: 2.90 cm Ao Asc diam:  3.30 cm MITRAL VALVE               TRICUSPID VALVE MV Area (PHT): 4.06 cm    TR Peak grad:   27.9 mmHg MV Decel Time: 187 msec    TR Vmax:        264.00 cm/s MV E velocity: 72.60 cm/s MV A velocity: 90.70 cm/s  SHUNTS MV E/A ratio:  0.80        Systemic VTI:  0.29 m                            Systemic Diam: 2.10 cm Morene Brownie Electronically signed by Morene Brownie Signature Date/Time: 06/20/2024/11:15:52 AM    Final    CT HEAD POST STROKE FOLLOWUP/TIMED/STAT READ Result Date: 06/20/2024 EXAM: CT HEAD WITHOUT CONTRAST 06/20/2024 05:37:14 AM TECHNIQUE: CT of the head was performed without the administration of intravenous contrast. Automated exposure control, iterative reconstruction, and/or weight based adjustment of the mA/kV was utilized to reduce the radiation dose to as low as reasonably achievable. COMPARISON: None available. CLINICAL HISTORY: Neuro deficit, acute, stroke suspected. Follow up stroke. Left frontal hemorrhage. FINDINGS: BRAIN AND VENTRICLES: The anterior medial left frontal lobe hemorrhage has increased in size since the previous study. The hemorrhage now measures 4.4 x 2.5 x 2.5 cm. It measured 3.7 x 1.9 x 1.3 cm on the first CT scan yesterday. Vasogenic edema is present with local mass effect. Parafalcine subdural hemorrhage is stable. Periventricular white matter changes are similar to the prior study. No new hemorrhage or ischemic infarct is present. ORBITS: No acute abnormality. SINUSES: No acute abnormality. SOFT TISSUES AND SKULL: No acute soft tissue abnormality. No skull fracture. IMPRESSION: 1. Increased size of the anterior medial left frontal lobe hemorrhage, now measuring 4.4 x 2.5 x 2.5 cm, with associated vasogenic edema and local mass effect. 2. Stable parafalcine subdural hemorrhage. 3. No new hemorrhage or ischemic infarct. 4. Periventricular white matter changes similar to the prior study. The pertinent results were texted to Dr. Jerrie via the [facility] system at 5:54 am. Electronically signed by: Lonni Necessary MD 06/20/2024 05:55 AM EDT RP Workstation: [id number]   MR BRAIN W WO CONTRAST Result Date: 06/19/2024 CLINICAL DATA:  Stroke, hemorrhagic EXAM: MRI HEAD WITHOUT AND WITH CONTRAST TECHNIQUE: Multiplanar, multiecho pulse sequences of the brain and surrounding structures were obtained without and with intravenous  contrast. CONTRAST:  6mL GADAVIST  GADOBUTROL  1 MMOL/ML IV SOLN COMPARISON:  CT of the head dated June 19, 2024 an MRI of the brain dated June 28, 2023. FINDINGS: Brain: An ovoid intraparenchymal hemorrhage is again demonstrated medially within the left frontal lobe measuring approximately the 3.1 x 1.9 x 2.7 cm. There is no evidence of underlying mass or vascular malformation. There is trace edema around the periphery of the hematoma. There is an adjacent left parafalcine subdural hematoma, measuring up to 4 mm in thickness. There is age related cerebral volume loss and mild to moderate cerebral white matter disease. There is no abnormal  parenchymal or meningeal enhancement. There is residual hemosiderin staining present in the left precentral sulcus. Vascular: Normal arterial and venous flow voids. Skull and upper cervical spine: Normal marrow signal. No osseous lesions are evident. Sinuses/Orbits: Normal orbits. Mild mucosal disease within the left maxillary sinus. Other: None. IMPRESSION: 1. Left frontal lobe cerebral hematoma without significant interval change. No evidence of underlying mass or vascular malformation. 2. Stable left parafalcine subdural hematoma. 3. Chronic hemosiderin staining within the left precentral sulcus from remote subarachnoid hemorrhage. Electronically Signed   By: Evalene Coho M.D.   On: 06/19/2024 15:01   Chest Port 1 View Result Date: 06/19/2024 CLINICAL DATA:  Aspiration into airway. EXAM: PORTABLE CHEST 1 VIEW COMPARISON:  None Available. FINDINGS: The heart size and mediastinal contours are within normal limits. Both lungs are clear. The visualized skeletal structures are unremarkable. IMPRESSION: No active disease. Electronically Signed   By: Lynwood Landy Raddle M.D.   On: 06/19/2024 12:18   CT ANGIO HEAD NECK W WO CM (CODE STROKE) Result Date: 06/19/2024 CLINICAL DATA:  Neuro deficit, acute, stroke suspected. Right-sided weakness. Stroke, hemorrhagic. EXAM: CT ANGIOGRAPHY  HEAD AND NECK WITH AND WITHOUT CONTRAST CT VENOGRAM HEAD TECHNIQUE: Multidetector CT imaging of the head and neck was performed using the standard protocol during bolus administration of intravenous contrast. Multiplanar CT image reconstructions and MIPs were obtained to evaluate the vascular anatomy. Carotid stenosis measurements (when applicable) are obtained utilizing NASCET criteria, using the distal internal carotid diameter as the denominator. Venographic phase images of the brain were obtained following the administration of intravenous contrast. Multiplanar reformats and maximum intensity projections were generated. RADIATION DOSE REDUCTION: This exam was performed according to the departmental dose-optimization program which includes automated exposure control, adjustment of the mA and/or kV according to patient size and/or use of iterative reconstruction technique. CONTRAST:  75mL OMNIPAQUE  IOHEXOL  350 MG/ML SOLN COMPARISON:  Noncontrast head CT performed earlier today 06/19/2024. CT angiogram head/neck 06/28/2023. FINDINGS: CTA NECK FINDINGS Aortic arch: Common origin of the innominate and left common carotid arteries. The left vertebral artery arises directly from the aortic arch. Atherosclerotic plaque within the visible aortic arch. Streak/beam hardening artifact arising from a dense contrast bolus partially obscures the right subclavian artery. Within this limitation, there is no appreciable hemodynamically significant innominate or proximal subclavian artery stenosis. Right carotid system: CCA and ICA patent within the neck without stenosis or significant atherosclerotic disease. Left carotid system: CCA and ICA patent within the neck without stenosis or significant atherosclerotic disease. Vertebral arteries: The left vertebral artery is non dominant and developmentally diminutive, but patent throughout the neck. The left vertebral artery is patent within the neck without stenosis or significant  atherosclerotic disease. Skeleton: No acute fracture or aggressive osseous lesion. Spondylosis at the cervical and visualized upper thoracic levels. Facet ankylosis on the left at C3-C4 and on the right at C4-C5. Mild chronic T2 superior endplate vertebral compression deformity, unchanged. Mild grade 1 anterolisthesis at C6-C7, T1-T2 and T2-T3. Other neck: Thyroid  nodules measuring up to 12 mm, not meeting consensus criteria for ultrasound follow-up based on size. No follow-up imaging recommended. Reference: J Am Coll Radiol. 2015 Feb;12(2): 143-50. Upper chest: No consolidation within the imaged lung apices. Review of the MIP images confirms the above findings CTA HEAD FINDINGS Anterior circulation: The intracranial internal carotid arteries are patent. The M1 middle cerebral arteries are patent. No M2 proximal branch occlusion or high-grade proximal stenosis. The anterior cerebral arteries are patent. Hypoplastic right A1 segment. No intracranial aneurysm  is identified. Posterior circulation: The intracranial right vertebral artery is patent. The left vertebral artery remains developmentally diminutive intracranially and predominantly supplies the left PICA. The basilar artery is patent. The posterior cerebral arteries are patent. Hypoplastic right P1 segment with sizable right posterior communicating artery. A posterior communicating artery is also present on the left. Venous sinuses: Separately reported on same day CT venogram head. Anatomic variants: As described. Other: No spot sign identified in the region of the left frontal lobe hemorrhage on delayed imaging. Review of the MIP images confirms the above findings CT VENOGRAM HEAD FINDINGS The superior sagittal sinus, internal cerebral veins, vein of Galen, straight sinus, transverse sinuses, sigmoid sinuses and visualized jugular veins are patent. There is no appreciable intracranial venous thrombosis. No emergent large vessel occlusion identified. These  results were communicated to Dr. Voncile at 10:20 amon 6/24/2025by text page via the [facility] messaging system. IMPRESSION: CTA neck: 1. The common carotid and internal carotid arteries are patent within the neck without stenosis or significant atherosclerotic disease. 2. The right vertebral artery is patent within the neck without stenosis or significant atherosclerotic disease. 3. The left vertebral artery is non-dominant and developing diminutive, but patent throughout the neck. 4. Aortic Atherosclerosis (ICD10-I70.0). CTA head: 1. No proximal intracranial large vessel occlusion or high-grade proximal arterial stenosis identified. 2. No evidence of an intracranial aneurysm or arteriovenous malformation. CT venogram head: No evidence of dural venous sinus thrombosis. Electronically Signed   By: Rockey Childs D.O.   On: 06/19/2024 10:21   CT VENOGRAM HEAD Result Date: 06/19/2024 CLINICAL DATA:  Neuro deficit, acute, stroke suspected. Right-sided weakness. Stroke, hemorrhagic. EXAM: CT ANGIOGRAPHY HEAD AND NECK WITH AND WITHOUT CONTRAST CT VENOGRAM HEAD TECHNIQUE: Multidetector CT imaging of the head and neck was performed using the standard protocol during bolus administration of intravenous contrast. Multiplanar CT image reconstructions and MIPs were obtained to evaluate the vascular anatomy. Carotid stenosis measurements (when applicable) are obtained utilizing NASCET criteria, using the distal internal carotid diameter as the denominator. Venographic phase images of the brain were obtained following the administration of intravenous contrast. Multiplanar reformats and maximum intensity projections were generated. RADIATION DOSE REDUCTION: This exam was performed according to the departmental dose-optimization program which includes automated exposure control, adjustment of the mA and/or kV according to patient size and/or use of iterative reconstruction technique. CONTRAST:  75mL OMNIPAQUE  IOHEXOL  350 MG/ML SOLN  COMPARISON:  Noncontrast head CT performed earlier today 06/19/2024. CT angiogram head/neck 06/28/2023. FINDINGS: CTA NECK FINDINGS Aortic arch: Common origin of the innominate and left common carotid arteries. The left vertebral artery arises directly from the aortic arch. Atherosclerotic plaque within the visible aortic arch. Streak/beam hardening artifact arising from a dense contrast bolus partially obscures the right subclavian artery. Within this limitation, there is no appreciable hemodynamically significant innominate or proximal subclavian artery stenosis. Right carotid system: CCA and ICA patent within the neck without stenosis or significant atherosclerotic disease. Left carotid system: CCA and ICA patent within the neck without stenosis or significant atherosclerotic disease. Vertebral arteries: The left vertebral artery is non dominant and developmentally diminutive, but patent throughout the neck. The left vertebral artery is patent within the neck without stenosis or significant atherosclerotic disease. Skeleton: No acute fracture or aggressive osseous lesion. Spondylosis at the cervical and visualized upper thoracic levels. Facet ankylosis on the left at C3-C4 and on the right at C4-C5. Mild chronic T2 superior endplate vertebral compression deformity, unchanged. Mild grade 1 anterolisthesis at C6-C7, T1-T2 and T2-T3. Other  neck: Thyroid  nodules measuring up to 12 mm, not meeting consensus criteria for ultrasound follow-up based on size. No follow-up imaging recommended. Reference: J Am Coll Radiol. 2015 Feb;12(2): 143-50. Upper chest: No consolidation within the imaged lung apices. Review of the MIP images confirms the above findings CTA HEAD FINDINGS Anterior circulation: The intracranial internal carotid arteries are patent. The M1 middle cerebral arteries are patent. No M2 proximal branch occlusion or high-grade proximal stenosis. The anterior cerebral arteries are patent. Hypoplastic right A1  segment. No intracranial aneurysm is identified. Posterior circulation: The intracranial right vertebral artery is patent. The left vertebral artery remains developmentally diminutive intracranially and predominantly supplies the left PICA. The basilar artery is patent. The posterior cerebral arteries are patent. Hypoplastic right P1 segment with sizable right posterior communicating artery. A posterior communicating artery is also present on the left. Venous sinuses: Separately reported on same day CT venogram head. Anatomic variants: As described. Other: No spot sign identified in the region of the left frontal lobe hemorrhage on delayed imaging. Review of the MIP images confirms the above findings CT VENOGRAM HEAD FINDINGS The superior sagittal sinus, internal cerebral veins, vein of Galen, straight sinus, transverse sinuses, sigmoid sinuses and visualized jugular veins are patent. There is no appreciable intracranial venous thrombosis. No emergent large vessel occlusion identified. These results were communicated to Dr. Voncile at 10:20 amon 6/24/2025by text page via the [facility] messaging system. IMPRESSION: CTA neck: 1. The common carotid and internal carotid arteries are patent within the neck without stenosis or significant atherosclerotic disease. 2. The right vertebral artery is patent within the neck without stenosis or significant atherosclerotic disease. 3. The left vertebral artery is non-dominant and developing diminutive, but patent throughout the neck. 4. Aortic Atherosclerosis (ICD10-I70.0). CTA head: 1. No proximal intracranial large vessel occlusion or high-grade proximal arterial stenosis identified. 2. No evidence of an intracranial aneurysm or arteriovenous malformation. CT venogram head: No evidence of dural venous sinus thrombosis. Electronically Signed   By: Rockey Childs D.O.   On: 06/19/2024 10:21   CT HEAD CODE STROKE WO CONTRAST Result Date: 06/19/2024 CLINICAL DATA:  Code stroke. Neuro  deficit, acute, stroke suspected. Right-sided weakness. EXAM: CT HEAD WITHOUT CONTRAST TECHNIQUE: Contiguous axial images were obtained from the base of the skull through the vertex without intravenous contrast. RADIATION DOSE REDUCTION: This exam was performed according to the departmental dose-optimization program which includes automated exposure control, adjustment of the mA and/or kV according to patient size and/or use of iterative reconstruction technique. COMPARISON:  Head CT 03/09/2024.  CT angiogram head/neck 06/28/2023. FINDINGS: Brain: No age-advanced or lobar predominant cerebral atrophy. 3.8 x 1.3 x 1.4 cm (3.5 mL) acute parenchymal hemorrhage within the anteromedial left frontal lobe. Acute subdural hemorrhage along the left aspect of the falx (measuring up to 5 mm in thickness). Patchy and ill-defined hypoattenuation within the cerebral white matter, nonspecific but compatible with mild chronic small vessel ischemic disease. No evidence of an intracranial mass. No midline shift or hydrocephalus. Vascular: No hyperdense vessel.  Atherosclerotic calcifications. Skull: No calvarial fracture or aggressive osseous lesion. Sinuses/Orbits: No mass or acute finding within the imaged orbits. Minimal mucosal thickening within the left maxillary sinus at the imaged levels. These results were called by telephone at the time of interpretation on 06/19/2024 at 9:50 am to provider Dr. Voncile, who verbally acknowledged these results. IMPRESSION: 1. 3.8 x 1.3 x 1.4 cm (3.5 mL) acute parenchymal hemorrhage within the anteromedial left frontal lobe. 2. Acute subdural hemorrhage along the  left aspect of the falx (measuring up to 5 mm in thickness). 3. No midline shift. 4. Mild cerebral white matter chronic small vessel ischemic disease. Electronically Signed   By: Rockey Childs D.O.   On: 06/19/2024 09:51    Microbiology: Results for orders placed or performed during the hospital encounter of 06/19/24  MRSA Next Gen  by PCR, Nasal     Status: None   Collection Time: 06/19/24  3:52 PM   Specimen: Nasal Mucosa; Nasal Swab  Result Value Ref Range Status   MRSA by PCR Next Gen NOT DETECTED NOT DETECTED Final    Comment: (NOTE) The GeneXpert MRSA Assay (FDA approved for NASAL specimens only), is one component of a comprehensive MRSA colonization surveillance program. It is not intended to diagnose MRSA infection nor to guide or monitor treatment for MRSA infections. Test performance is not FDA approved in patients less than 11 years old. Performed at [facility], [address], [place], [place] [address]     Labs: CBC: Recent Labs  Lab 06/19/24 [phone] 06/19/24 0933 06/20/24 0854 06/21/24 0443 06/22/24 0644  WBC 12.5*  --  7.7 6.5 6.6  NEUTROABS 10.1*  --   --   --   --   HGB 12.2 12.6 12.0 11.3* 12.8  HCT 38.0 37.0 36.5 35.0* 39.0  MCV 96.0  --  94.1 94.9 94.0  PLT 259  --  253 241 264   Basic Metabolic Panel: Recent Labs  Lab 06/19/24 0929 06/19/24 0933 06/20/24 0853 06/20/24 0854 06/21/24 0443 06/22/24 0644  NA 141 140 139 139 140 142  K 4.0 3.9  --  3.8 4.1 4.2  CL 105 105  --  104 107 103  CO2 26  --   --  26 22 28   GLUCOSE 118* 114*  --  133* 118* 104*  BUN 22 25*  --  20 15 12   CREATININE 0.83 0.90  --  0.70 0.61 0.68  CALCIUM  9.6  --   --  9.0 8.7* 9.8   Liver Function Tests: Recent Labs  Lab 06/19/24 0929  AST 21  ALT 18  ALKPHOS 42  BILITOT 0.8  PROT 6.6  ALBUMIN 4.1   CBG: Recent Labs  Lab 06/19/24 0926  GLUCAP 111*    Discharge time spent: approximately 45 minutes spent on discharge counseling, evaluation of patient on day of discharge, and coordination of discharge planning with nursing, social work, pharmacy and case management  Signed: Lonni SHAUNNA Dalton, MD [facility] 06/22/2024

## 2024-06-22 NOTE — TOC Progression Note (Signed)
 Transition of Care [facility]) - Progression Note    Patient Details  Name: Chelsea Barnett MRN: [id number] Date of Birth: July 10, 1946  Transition [facility]) CM/SW Contact  Inocente GORMAN Kindle, LCSW Phone Number: 06/22/2024, 12:36 PM  Clinical Narrative:    12:38 PM-CSW received call from MD to cancel DC. CSW updated RN. Per [facility], they should have beds available this weekend. CSW provided weekend [facility] contact info.    Expected Discharge Plan: IP Rehab Facility Barriers to Discharge:  (Inpatient rehabs reviewing)  Expected Discharge Plan and Services In-house Referral: Clinical Social Work Discharge Planning Services: CM Consult Post Acute Care Choice: IP Rehab Living arrangements for the past 2 months: Single Family Home Expected Discharge Date: 06/22/24                                     Social Determinants of Health (SDOH) Interventions SDOH Screenings   Food Insecurity: No Food Insecurity (06/19/2024)  Housing: Low Risk  (06/19/2024)  Transportation Needs: No Transportation Needs (06/19/2024)  Utilities: Not At Risk (06/19/2024)  Social Connections: Moderately Integrated (06/19/2024)  Tobacco Use: Low Risk  (06/19/2024)    Readmission Risk Interventions     No data to display

## 2024-06-22 NOTE — Plan of Care (Signed)
 Oriented to room. Bed alarm turned on. Assistance to bathroom. Family at bedside. Multiple purple scattered bruising on arms, chest and legs with redness on sacral area.     Problem: Education: Goal: Knowledge of disease or condition will improve Outcome: Progressing   Problem: Activity: Goal: Risk for activity intolerance will decrease Outcome: Progressing   Problem: Safety: Goal: Ability to remain free from injury will improve Outcome: Progressing   Problem: Skin Integrity: Goal: Risk for impaired skin integrity will decrease Outcome: Progressing

## 2024-06-22 NOTE — Progress Notes (Addendum)
 STROKE TEAM PROGRESS NOTE    SIGNIFICANT HOSPITAL EVENTS 6/24-patient admitted with left frontal lobe ICH, briefly given hypertonic saline but this is now been discontinued 6/26-head CT shows slight expansion of hematoma, but patient looks good clinically.  Will transfer out of the ICU and obtain repeat head CT in the morning.  INTERIM HISTORY/SUBJECTIVE CT stable this morning.   OBJECTIVE  CBC    Component Value Date/Time   WBC 6.6 06/22/2024 0644   RBC 4.15 06/22/2024 0644   HGB 12.8 06/22/2024 0644   HCT 39.0 06/22/2024 0644   PLT 264 06/22/2024 0644   MCV 94.0 06/22/2024 0644   MCH 30.8 06/22/2024 0644   MCHC 32.8 06/22/2024 0644   RDW 12.4 06/22/2024 0644   LYMPHSABS 1.5 06/19/2024 0929   MONOABS 0.7 06/19/2024 0929   EOSABS 0.0 06/19/2024 0929   BASOSABS 0.1 06/19/2024 0929    BMET    Component Value Date/Time   NA 142 06/22/2024 0644   K 4.2 06/22/2024 0644   CL 103 06/22/2024 0644   CO2 28 06/22/2024 0644   GLUCOSE 104 (H) 06/22/2024 0644   BUN 12 06/22/2024 0644   CREATININE 0.68 06/22/2024 0644   CALCIUM  9.8 06/22/2024 0644   GFRNONAA >60 06/22/2024 0644    IMAGING past 24 hours CT HEAD WO CONTRAST ( ) Result Date: 06/22/2024 EXAM: CT HEAD WITHOUT 06/22/2024 06:30:09 AM TECHNIQUE: CT of the head was performed without the administration of intravenous contrast. Automated exposure control, iterative reconstruction, and/or weight based adjustment of the mA/kV was utilized to reduce the radiation dose to as low as reasonably achievable. COMPARISON: CT head without contrast 06/21/2024 and 06/20/2024. CLINICAL HISTORY: Stroke, hemorrhagic. Stroke, hemorrhagic follow up. FINDINGS: BRAIN AND VENTRICLES: The hyperdense medial anterior left frontal lobe hemorrhage is stable to slightly decreased in size measuring 4.7 x 2.4 x 2.6 cm. Surrounding vasogenic edema is again noted. Minimal subfalcine herniation is present. Subdural blood along the falx is stable. No new  hemorrhage is present. Periventricular white matter disease is stable. No mass effect or midline shift. No extra-axial fluid collection. Gray-white differentiation is maintained. No hydrocephalus. ORBITS: No acute abnormality. SINUSES AND MASTOIDS: No acute abnormality. SOFT TISSUES AND SKULL: No acute skull fracture. No acute soft tissue abnormality. VASCULATURE: Atherosclerotic calcifications are present in the cavernous carotid arteries bilaterally. No hyperdense vessel is present. IMPRESSION: 1. Stable to slightly decreased hyperdense medial anterior left frontal lobe hemorrhage measuring 4.7 x 2.4 x 2.6 cm with surrounding vasogenic edema and minimal subfalcine herniation. 2. Stable subdural blood along the falx. 3. No new hemorrhage. Electronically signed by: Lonni Necessary MD 06/22/2024 06:40 AM EDT RP Workstation: [id number]    Vitals:   06/22/24 0200 06/22/24 0400 06/22/24 0600 06/22/24 0757  BP: (!) 101/50 134/63 (!) 144/73   Pulse: 61 (!) 58 68   Resp: 19 15 (!) 25   Temp:    97.8 F (36.6 C)  TempSrc:    Axillary  SpO2: 96% 96% 93%   Weight:      Height:         PHYSICAL EXAM General:  Alert, well-nourished, well-developed patient in no acute distress Psych:  Mood and affect appropriate for situation CV: Regular rate and rhythm on monitor Respiratory:  Regular, unlabored respirations on room air   NEURO:  Mental Status: AA&Ox3, patient is able to give clear and coherent history, does report some recent memory problems Speech/Language: speech is without dysarthria or aphasia.    Cranial Nerves:  II: PERRL.  III, IV, VI: EOMI. Eyelids elevate symmetrically.  V: Sensation is intact to light touch and symmetrical to face.  VII: Face is symmetrical resting and smiling VIII: hearing intact to voice. IX, X: Phonation is normal.  XII: tongue is midline without fasciculations. Motor: Able to move all 4 extremities with good antigravity strength Tone: is normal and bulk is  normal Sensation- Intact to light touch bilaterally. Coordination: FTN intact bilaterally Gait- deferred  Most Recent NIH 0   ASSESSMENT/PLAN  Chelsea Barnett is a 78 y.o. female with history of TIAs versus amyloid spells, stroke, ICH and possible cerebral amyloid angiopathy admitted for gait instability and right-sided weakness.  Patient was found on CT to have acute 3.5 cc IPH in anterior medial left frontal lobe as well as subdural hemorrhage along the left aspect of the falx.  Patient reports some recent memory problems and has been seen by neurology for this, and there is also a question of cerebral amyloid angiopathy.  She has had some expansion of hematoma on repeat head CT but looks good clinically.  Will transfer out of the ICU today.  NIH on Admission 1  ICH: Left frontal lobe IPH with falcine SDH, etiology: Likely cerebral amyloid angiopathy Code Stroke CT head 3.5 mm acute IPH within anteromedial left frontal lobe, acute subdural hemorrhage along the left aspect of the falx CTA head & neck LVO, aneurysm or AVM CTV no venous thrombosis MRI left frontal lobe hematoma without significant interval change, stable left parafalcine subdural hematoma, chronic hemosiderin staining within left precentral sulcus from remote subarachnoid hemorrhage Repeat CT 6/25 increased size of anterior medial left frontal lobe hemorrhage with some vasogenic edema and local mass effect, stable parafalcine subdural hemorrhage CT repeat 6/26 slightly increased size of left frontal parafalcine IPH to 13 mm CT repeat 6/27 stable with slight decrease in hyperdense medial anterior left frontal lobe 4.7x2.4x2.6 2D Echo EF 55 to 60%, normal left atrial size, no atrial level shunt LDL 45 HgbA1c 5.3 UDS negative VTE prophylaxis -SCDs No antithrombotic prior to admission, now on No antithrombotic due to IPH and CAA Therapy recommendation CIR Disposition: Pending  Hx of Stroke/TIA/ICH CAA 06/2023  admitted for right upper extremity heaviness x 2.  MRI showed left frontal tiny infarct, left parietal sulcal SAH.  Concerning for CAA with amyloid spells.  EF 60 to 65%.  LDL 130, A1c 5.4.  Discharged on aspirin  81 and Crestor  10.  Hypertension Home meds: Losartan  50 mg daily Stable Long-term BP goal less than 140 given CAA  Hyperlipidemia Home meds: Rosuvastatin  5 mg daily LDL 45, goal < 70 Patient and family will discuss participation in [place] trial, ICF given to read, last day 06/25/24  Other Stroke Risk Factors None  Other Active Problems Hematuria noted by RN, UA neg Mild cognitive impairment since 06/2023 Mild leukocytosis WBC 12.5-7.7-6.6  Hospital day # 3  Patient seen and examined by NP/APP with MD. MD to update note as needed.   Jorene Last, DNP, FNP-BC [facility] Pager: [phone]  ATTENDING NOTE: I reviewed above note and agree with the assessment and plan. Pt was seen and examined.   Husband at bedside.  Patient sitting in chair, neuro intact, no acute event overnight.  CT repeat shows stable hematoma now.  Transfer out of ICU, pending CIR.  Pending family decision about [place] trial   For detailed assessment and plan, please refer to above as I have made changes wherever appropriate.   Ary Cummins, MD PhD  Stroke Neurology 06/22/2024 5:04 PM    To contact Stroke Continuity provider, please refer to [email or web address]. After hours, contact General Neurology

## 2024-06-22 NOTE — TOC Progression Note (Addendum)
 Transition of Care [facility]) - Progression Note    Patient Details  Name: Chelsea Barnett MRN: [id number] Date of Birth: 1946-01-07  Transition of Care [facility]) CM/SW Contact  Inocente GORMAN Kindle, LCSW Phone Number: 06/22/2024, 9:03 AM  Clinical Narrative:    9am-CSW reviewed chart and noted CIR is unable to accept patient. CSW spoke with patient's spouse who reported he spoke with [facility] yesterday about alternative locations of [address] and [facility] in [place]. Spouse reports preference for [facility] in [place]. CSW clarified name and address with him to confirm. CSW sent referral to [facility] for review and Delon will follow up.   11:42 AM-[facility] rep visited patient and is able to accept her today. CSW met with spouse at bedside and he is agreeable for discharge today but requests patient see PT first and have a bath. CSW updated MD and RN and requested someone from PT see patient. Spouse and patient state he will transport her by car.   Expected Discharge Plan: IP Rehab Facility Barriers to Discharge:  (Inpatient rehabs reviewing)  Expected Discharge Plan and Services In-house Referral: Clinical Social Work Discharge Planning Services: CM Consult Post Acute Care Choice: IP Rehab Living arrangements for the past 2 months: Single Family Home                                       Social Determinants of Health (SDOH) Interventions SDOH Screenings   Food Insecurity: No Food Insecurity (06/19/2024)  Housing: Low Risk  (06/19/2024)  Transportation Needs: No Transportation Needs (06/19/2024)  Utilities: Not At Risk (06/19/2024)  Social Connections: Moderately Integrated (06/19/2024)  Tobacco Use: Low Risk  (06/19/2024)    Readmission Risk Interventions     No data to display

## 2024-06-23 DIAGNOSIS — R29700 NIHSS score 0: Secondary | ICD-10-CM | POA: Diagnosis not present

## 2024-06-23 DIAGNOSIS — I611 Nontraumatic intracerebral hemorrhage in hemisphere, cortical: Secondary | ICD-10-CM | POA: Diagnosis not present

## 2024-06-23 DIAGNOSIS — E859 Amyloidosis, unspecified: Secondary | ICD-10-CM | POA: Diagnosis not present

## 2024-06-23 DIAGNOSIS — I619 Nontraumatic intracerebral hemorrhage, unspecified: Secondary | ICD-10-CM | POA: Diagnosis not present

## 2024-06-23 DIAGNOSIS — I6200 Nontraumatic subdural hemorrhage, unspecified: Secondary | ICD-10-CM | POA: Diagnosis not present

## 2024-06-23 MED ORDER — APREMILAST 30 MG PO TABS: 15.0000 mg | ORAL_TABLET | Freq: Two times a day (BID) | ORAL | Status: DC

## 2024-06-23 MED ORDER — APREMILAST 30 MG PO TABS: ORAL_TABLET | Freq: Two times a day (BID) | ORAL | Status: DC

## 2024-06-23 MED ORDER — APREMILAST 30 MG PO TABS
15.0000 mg | ORAL_TABLET | Freq: Two times a day (BID) | ORAL | Status: DC
  Filled 2024-06-23 (×3): qty 1

## 2024-06-23 MED ORDER — APREMILAST 30 MG PO TABS: 15.0000 mg | ORAL_TABLET | Freq: Every morning | ORAL | Status: DC

## 2024-06-23 MED ORDER — BISACODYL 10 MG RE SUPP
10.0000 mg | Freq: Every day | RECTAL | Status: DC | PRN
  Administered 2024-06-24: 10 mg via RECTAL
  Filled 2024-06-23: qty 1

## 2024-06-23 MED ORDER — APREMILAST 30 MG PO TABS
15.0000 mg | ORAL_TABLET | Freq: Two times a day (BID) | ORAL | Status: DC
  Administered 2024-06-23 – 2024-06-24 (×2): 15 mg via ORAL
  Filled 2024-06-23: qty 1
  Filled 2024-06-23: qty 0.5
  Filled 2024-06-23: qty 1
  Filled 2024-06-23: qty 0.5
  Filled 2024-06-23: qty 1

## 2024-06-23 MED ORDER — POLYETHYLENE GLYCOL 3350 17 G PO PACK
17.0000 g | PACK | Freq: Every day | ORAL | Status: DC
  Administered 2024-06-23 – 2024-06-24 (×2): 17 g via ORAL
  Filled 2024-06-23 (×2): qty 1

## 2024-06-23 MED ORDER — APREMILAST 30 MG PO TABS: 30.0000 mg | ORAL_TABLET | Freq: Every day | ORAL | Status: DC

## 2024-06-23 NOTE — Progress Notes (Signed)
 RNCM spoke to [facility] Representative, Delane, no bed availibility today-bed will be ready tomorrow after 12.  RNCM spoke to patient and Husband at [facility] and provided update.

## 2024-06-23 NOTE — Plan of Care (Signed)
  Problem: Education: Goal: Knowledge of disease or condition will improve Outcome: Progressing   Problem: Coping: Goal: Will verbalize positive feelings about self Outcome: Progressing Goal: Will identify appropriate support needs Outcome: Progressing   Problem: Health Behavior/Discharge Planning: Goal: Goals will be collaboratively established with patient/family Outcome: Progressing   Problem: Nutrition: Goal: Risk of aspiration will decrease Outcome: Progressing   Problem: Education: Goal: Knowledge of General Education information will improve Description: Including pain rating scale, medication(s)/side effects and non-pharmacologic comfort measures Outcome: Progressing   Problem: Clinical Measurements: Goal: Respiratory complications will improve Outcome: Progressing Goal: Cardiovascular complication will be avoided Outcome: Progressing   Problem: Activity: Goal: Risk for activity intolerance will decrease Outcome: Progressing   Problem: Nutrition: Goal: Adequate nutrition will be maintained Outcome: Progressing   Problem: Coping: Goal: Level of anxiety will decrease Outcome: Progressing   Problem: Elimination: Goal: Will not experience complications related to urinary retention Outcome: Progressing   Problem: Skin Integrity: Goal: Risk for impaired skin integrity will decrease Outcome: Progressing

## 2024-06-23 NOTE — Progress Notes (Signed)
 STROKE TEAM PROGRESS NOTE    SIGNIFICANT HOSPITAL EVENTS 6/24-patient admitted with left frontal lobe ICH, briefly given hypertonic saline but this is now been discontinued 6/26-head CT shows slight expansion of hematoma, but patient looks good clinically.  Will transfer out of the ICU and obtain repeat head CT in the morning.  INTERIM HISTORY/SUBJECTIVE Her husband is at the bedside.  She has no complaints.  Vital signs are stable.  Neurological examination unchanged with still some mild right leg weakness.  Patient has been accepted for inpatient rehab in [place].  OBJECTIVE  CBC    Component Value Date/Time   WBC 6.6 06/22/2024 0644   RBC 4.15 06/22/2024 0644   HGB 12.8 06/22/2024 0644   HCT 39.0 06/22/2024 0644   PLT 264 06/22/2024 0644   MCV 94.0 06/22/2024 0644   MCH 30.8 06/22/2024 0644   MCHC 32.8 06/22/2024 0644   RDW 12.4 06/22/2024 0644   LYMPHSABS 1.5 06/19/2024 0929   MONOABS 0.7 06/19/2024 0929   EOSABS 0.0 06/19/2024 0929   BASOSABS 0.1 06/19/2024 0929    BMET    Component Value Date/Time   NA 142 06/22/2024 0644   K 4.2 06/22/2024 0644   CL 103 06/22/2024 0644   CO2 28 06/22/2024 0644   GLUCOSE 104 (H) 06/22/2024 0644   BUN 12 06/22/2024 0644   CREATININE 0.68 06/22/2024 0644   CALCIUM  9.8 06/22/2024 0644   GFRNONAA >60 06/22/2024 0644    IMAGING past 24 hours No results found.   Vitals:   06/23/24 1134 06/23/24 1144 06/23/24 1205 06/23/24 1225  BP: (!) 96/58 (!) 90/57 (!) 99/55 100/70  Pulse: 73     Resp: 18     Temp: 98.3 F (36.8 C)     TempSrc: Oral     SpO2: 97%     Weight:      Height:         PHYSICAL EXAM General:  Alert, well-nourished, well-developed patient in no acute distress Psych:  Mood and affect appropriate for situation CV: Regular rate and rhythm on monitor Respiratory:  Regular, unlabored respirations on room air   NEURO:  Mental Status: AA&Ox3, patient is able to give clear and coherent history, does  report some recent memory problems Speech/Language: speech is without dysarthria or aphasia.    Cranial Nerves:  II: PERRL.  III, IV, VI: EOMI. Eyelids elevate symmetrically.  V: Sensation is intact to light touch and symmetrical to face.  VII: Face is symmetrical resting and smiling VIII: hearing intact to voice. IX, X: Phonation is normal.  XII: tongue is midline without fasciculations. Motor: Able to move all 4 extremities with good antigravity strength.  Mild right hip flexor dorsiflexor weakness. Tone: is normal and bulk is normal Sensation- Intact to light touch bilaterally. Coordination: FTN intact bilaterally Gait- deferred  Most Recent NIH 0   ASSESSMENT/PLAN  Chelsea Barnett is a 78 y.o. female with history of TIAs versus amyloid spells, stroke, ICH and possible cerebral amyloid angiopathy admitted for gait instability and right-sided weakness.  Patient was found on CT to have acute 3.5 cc IPH in anterior medial left frontal lobe as well as subdural hemorrhage along the left aspect of the falx.  Patient reports some recent memory problems and has been seen by neurology for this, and there is also a question of cerebral amyloid angiopathy.  She has had some expansion of hematoma on repeat head CT but looks good clinically.  Will transfer out of the ICU  today.  NIH on Admission 1  ICH: Left frontal lobe IPH with falcine SDH, etiology: Likely cerebral amyloid angiopathy Code Stroke CT head 3.5 mm acute IPH within anteromedial left frontal lobe, acute subdural hemorrhage along the left aspect of the falx CTA head & neck LVO, aneurysm or AVM CTV no venous thrombosis MRI left frontal lobe hematoma without significant interval change, stable left parafalcine subdural hematoma, chronic hemosiderin staining within left precentral sulcus from remote subarachnoid hemorrhage Repeat CT 6/25 increased size of anterior medial left frontal lobe hemorrhage with some vasogenic edema and  local mass effect, stable parafalcine subdural hemorrhage CT repeat 6/26 slightly increased size of left frontal parafalcine IPH to 13 mm CT repeat 6/27 stable with slight decrease in hyperdense medial anterior left frontal lobe [id number] 2D Echo EF 55 to 60%, normal left atrial size, no atrial level shunt LDL 45 HgbA1c 5.3 UDS negative VTE prophylaxis -SCDs No antithrombotic prior to admission, now on No antithrombotic due to IPH and CAA Therapy recommendation CIR Disposition: Pending  Hx of Stroke/TIA/ICH CAA 06/2023 admitted for right upper extremity heaviness x 2.  MRI showed left frontal tiny infarct, left parietal sulcal SAH.  Concerning for CAA with amyloid spells.  EF 60 to 65%.  LDL 130, A1c 5.4.  Discharged on aspirin  81 and Crestor  10.  Hypertension Home meds: Losartan  50 mg daily Stable Long-term BP goal less than 140 given CAA  Hyperlipidemia Home meds: Rosuvastatin  5 mg daily LDL 45, goal < 70 Patient and family will discuss participation in [employer] trial, ICF given to read, last day 06/25/24  Other Stroke Risk Factors None  Other Active Problems Hematuria noted by RN, UA neg Mild cognitive impairment since 06/2023 Mild leukocytosis WBC 12.5-7.7-6.6  Hospital day # 4  I have personally obtained history,examined this patient, reviewed notes, independently viewed imaging studies, participated in medical decision making and plan of care.ROS completed by me personally and pertinent positives fully documented  I have made any additions or clarifications directly to the above note. Agree with note above.  Continue ongoing therapies.  Patient medically stable to be transferred to inpatient rehab when bed available.  Family considering possible participation in the [employer] trial.  Long discussion with patient and husband and answered questions.  Discussed with Dr. Jonel.  Greater than 50% time during this 35-minute visit was spent in counseling and coordination of care and  discussion with patient and care team and answering questions.  Eather Popp, MD Medical Director [facility] Pager: [phone] 06/23/2024 1:44 PM    06/23/2024 1:43 PM    To contact Stroke Continuity provider, please refer to [email or web address]. After hours, contact General Neurology

## 2024-06-23 NOTE — Progress Notes (Signed)
  Progress Note   Patient: Chelsea Barnett FMW:[id number] DOB: 1946/10/21 DOA: 06/19/2024     4 DOS: the patient was seen and examined on 06/23/2024 at 11:17AM      Brief hospital course: 78 y.o. F with hx TIA, possible CAA who presented with gait instability and right sided weakness, found to have left frontal lobe IPH.     Assessment and Plan: Left frontal lobe IPH Discussed with neurology, symptoms appear stable.  CT yesterday showed no enlargement of the hematoma. -PT/OT   Hypertension Blood pressure low today - Hold losartan , metoprolol   Hyperlipidemia -Continue Crestor   Mild cognitive impairment - Continue mirtazapine          Subjective: Patient feels well.  Appetite normal.  No dizziness, no lightheadedness.  Able to ambulate to the bathroom.     Physical Exam: BP (!) 103/59 (BP Location: Left Arm)   Pulse 68   Temp 98.3 F (36.8 C) (Oral)   Resp 16   Ht 5' 3 (1.6 m)   Wt 56.7 kg   LMP 12/28/1999   SpO2 98%   BMI 22.14 kg/m   Adult female, lying in bed, interactive and appropriate, eating lunch RRR, no murmurs, no peripheral edema Respiratory rate normal, lungs clear without rales or wheezes Abdomen soft/palpation no guarding, no ascites or distention Attention normal, affect pleasant, judgment and insight appear normal, no aphasia, speech fluent not dysarthric, upper extremity strength appears symmetric      Family Communication: Husband    Disposition: Status is: Inpatient         Author: Lonni SHAUNNA Dalton, MD 06/23/2024 2:48 PM  For on call review [email or web address].

## 2024-06-23 NOTE — Plan of Care (Signed)

## 2024-06-24 DIAGNOSIS — K5900 Constipation, unspecified: Secondary | ICD-10-CM | POA: Diagnosis not present

## 2024-06-24 DIAGNOSIS — G3184 Mild cognitive impairment, so stated: Secondary | ICD-10-CM | POA: Diagnosis not present

## 2024-06-24 DIAGNOSIS — I619 Nontraumatic intracerebral hemorrhage, unspecified: Secondary | ICD-10-CM | POA: Diagnosis not present

## 2024-06-24 DIAGNOSIS — I69151 Hemiplegia and hemiparesis following nontraumatic intracerebral hemorrhage affecting right dominant side: Secondary | ICD-10-CM | POA: Diagnosis not present

## 2024-06-24 DIAGNOSIS — I69198 Other sequelae of nontraumatic intracerebral hemorrhage: Secondary | ICD-10-CM | POA: Diagnosis not present

## 2024-06-24 DIAGNOSIS — I10 Essential (primary) hypertension: Secondary | ICD-10-CM | POA: Diagnosis not present

## 2024-06-24 DIAGNOSIS — M6281 Muscle weakness (generalized): Secondary | ICD-10-CM | POA: Diagnosis not present

## 2024-06-24 DIAGNOSIS — C4491 Basal cell carcinoma of skin, unspecified: Secondary | ICD-10-CM | POA: Diagnosis not present

## 2024-06-24 DIAGNOSIS — I6200 Nontraumatic subdural hemorrhage, unspecified: Secondary | ICD-10-CM | POA: Diagnosis not present

## 2024-06-24 DIAGNOSIS — I639 Cerebral infarction, unspecified: Secondary | ICD-10-CM | POA: Diagnosis not present

## 2024-06-24 DIAGNOSIS — R2689 Other abnormalities of gait and mobility: Secondary | ICD-10-CM | POA: Diagnosis not present

## 2024-06-24 DIAGNOSIS — E785 Hyperlipidemia, unspecified: Secondary | ICD-10-CM | POA: Diagnosis not present

## 2024-06-24 DIAGNOSIS — S065XAA Traumatic subdural hemorrhage with loss of consciousness status unknown, initial encounter: Secondary | ICD-10-CM | POA: Diagnosis not present

## 2024-06-24 LAB — GLUCOSE, CAPILLARY: Glucose-Capillary: 105 mg/dL — ABNORMAL HIGH (ref 70–99)

## 2024-06-24 MED ORDER — POLYETHYLENE GLYCOL 3350 17 G PO PACK
17.0000 g | PACK | Freq: Every day | ORAL | Status: DC | PRN
Start: 2024-06-24 — End: 2024-08-16

## 2024-06-24 MED ORDER — BISACODYL 10 MG RE SUPP: 10.0000 mg | Freq: Every day | RECTAL | Status: DC | PRN

## 2024-06-24 MED ORDER — OTEZLA 30 MG PO TABS: ORAL_TABLET | ORAL | Status: AC

## 2024-06-24 NOTE — Discharge Summary (Signed)
 Physician Discharge Summary   Chelsea Barnett MRN: [id number] DOB: 01/13/1946  Admit date:     06/19/2024  Discharge date: 06/24/24  Discharge Physician: Lonni SHAUNNA Dalton   PCP: Loreli Elsie JONETTA Mickey., MD     Recommendations at discharge:  Follow up with Neurology for stroke in 4-8 weeks [facility]: Resume metoprolol  and/or Losartan  as appropriate to maintain target BP <140 mmHg     Discharge Diagnoses: Principal Problem:   Left frontal lobe intraparenchymal hemorrhage Other hospital problems:   Falcine subdural hematoma   Cerebral amyloid angiopathy   Cerebrovascular disease   Hypertension   Hyperlipidemia   Mild cognitive impairment       Hospital Course: Chelsea Barnett is a 78 y.o. F with hx TIA versus amyloid spells, history stroke, ICH and possible CAA who presented with acute gait instability and right-sided weakness.  In the ER she was found to have 3.5 mm IPH in the anterior left frontal lobe as well as subdural hematoma along the left aspect of the falx.  Patient reported recent memory problems for which she is being followed by neurology and there has been some question of cerebral amyloid angiopathy.      Left frontal lobe IPH with falcine SDH, etiology likely CAA Cerebrovascular disease The patient was admitted and CT head showed 3.5 mm acute IPH within anteromedial left frontal lobe, acute subdural hemorrhage along the left aspect of the falx.  CTA head and neck showed no LVO, no carotid disease.  CT venogram showed no venous thrombosis MRI of the brain confirmed frontal lobe hematoma, subdural hematoma, and chronic hemosiderin staining within the left precentral sulcus from remote SAH.  She was monitored in the ICU, repeat CT head on hospital day 2 showed increased size of the anterior medial left frontal lobe hemorrhage with some vasogenic edema and local mass effect, but clinically she appeared well and this was monitored  expectantly.  Repeat CT on 6/26 again showed slight interval increase in size, but again appeared clinically well.  Repeat CT on 6/27 showed slight decrease in left frontal lobe hematoma.    Aspirin  held at discharge.  Transferred to inpatient rehab.    Hypertension Blood pressure soft in the 90s-100s, metoprolol  and losartan  held.  Goal BP <140 mmHg systolic given CAA.   - Resume metoprolol  then losartan  as appropriate to maintain target BP <140 mmHg  Hyperlipidemia Stable on Crestor   Hematuria Urinalysis showed no hematuria, no leukocytosis.  Mild cognitive impairment Since 2024, July.  Follows with neurology          The [place]  Controlled Substances Registry was reviewed for this patient prior to discharge.  Consultants: Neurology   Disposition: Inpatient rehab   DISCHARGE MEDICATION: Allergies as of 06/24/2024   No Known Allergies      Medication List     PAUSE taking these medications    Aspirin  Low Dose 81 MG chewable tablet Wait to take this until your doctor or other care provider tells you to start again. Generic drug: aspirin  Chew 1 tablet (81 mg total) by mouth daily.   losartan  50 MG tablet Wait to take this until your doctor or other care provider tells you to start again. Commonly known as: COZAAR  Take 1 tablet (50 mg total) by mouth in the morning AND 0.5 tablets (25 mg total) every evening.   metoprolol  succinate 25 MG 24 hr tablet Wait to take this until your doctor or other care provider tells you to  start again. Commonly known as: Toprol  XL Take 1 tablet (25 mg total) by mouth daily.       STOP taking these medications    traZODone 50 MG tablet Commonly known as: DESYREL       TAKE these medications    acetaminophen  325 MG tablet Commonly known as: TYLENOL  Take 650 mg by mouth daily as needed for headache, mild pain (pain score 1-3) or moderate pain (pain score 4-6).   bisacodyl 10 MG suppository Commonly known  as: DULCOLAX Place 1 suppository (10 mg total) rectally daily as needed for moderate constipation.   mirtazapine  30 MG tablet Commonly known as: REMERON  Take 30 mg by mouth at bedtime.   Otezla 30 MG Tabs Generic drug: Apremilast Take 15 mg in the morning and 30 mg at night. What changed:  how much to take how to take this when to take this additional instructions   polyethylene glycol 17 g packet Commonly known as: MIRALAX / GLYCOLAX Take 17 g by mouth daily as needed.   rosuvastatin  10 MG tablet Commonly known as: CRESTOR  Take 0.5 tablets (5 mg total) by mouth daily.   triamcinolone cream 0.1 % Commonly known as: KENALOG Apply 1 Application topically as needed (psoriasis).   Vitamin D  (Ergocalciferol ) 1.25 MG (50000 UNIT) Caps capsule Commonly known as: DRISDOL  Take 50,000 Units by mouth every 7 (seven) days.        Follow-up Information     Loreli Elsie JONETTA Mickey., MD Follow up.   Specialty: Internal Medicine Contact information: [address] [place] [address] [phone]         [facility] Follow up.   Contact information: [address]     [address] [place]  [address] [phone]                Discharge Instructions     Discharge instructions   Complete by: As directed    You were admitted for a small intracranial bleeding. Your imaging showed a 3.5 mm hemorrhage within anteromedial left frontal lobe, and small subdural hemorrhage along the left aspect of the falx  You were treated supportively and your symptoms appear stable  STOP aspirin  for now  Continue your other home medicines except blood pressure medicines. Hold those, monitor blood pressure, and resume as needed to keep systolic blood pressure (top number) less than 140 mmHg  Go see Dr. Rosemarie in 4-6 weeks and discuss any further changes to your medicines       Discharge Exam: Filed Weights   06/19/24 0915  Weight: 56.7 kg     General: Pt is alert, awake, not in acute distress Cardiovascular: RRR, nl S1-S2, no murmurs appreciated.   No LE edema.   Respiratory: Normal respiratory rate and rhythm.  CTAB without rales or wheezes. Abdominal: Abdomen soft and non-tender.  No distension or HSM.   Neuro/Psych: Strength symmetric in upper and lower extremities.  Judgment and insight appear normal.   Condition at discharge: good  The results of significant diagnostics from this hospitalization (including imaging, microbiology, ancillary and laboratory) are listed below for reference.   Imaging Studies: CT HEAD WO CONTRAST ( ) Result Date: 06/22/2024 EXAM: CT HEAD WITHOUT 06/22/2024 06:30:09 AM TECHNIQUE: CT of the head was performed without the administration of intravenous contrast. Automated exposure control, iterative reconstruction, and/or weight based adjustment of the mA/kV was utilized to reduce the radiation dose to as low as reasonably achievable. COMPARISON: CT head without contrast 06/21/2024 and 06/20/2024.  CLINICAL HISTORY: Stroke, hemorrhagic. Stroke, hemorrhagic follow up. FINDINGS: BRAIN AND VENTRICLES: The hyperdense medial anterior left frontal lobe hemorrhage is stable to slightly decreased in size measuring 4.7 x 2.4 x 2.6 cm. Surrounding vasogenic edema is again noted. Minimal subfalcine herniation is present. Subdural blood along the falx is stable. No new hemorrhage is present. Periventricular white matter disease is stable. No mass effect or midline shift. No extra-axial fluid collection. Gray-white differentiation is maintained. No hydrocephalus. ORBITS: No acute abnormality. SINUSES AND MASTOIDS: No acute abnormality. SOFT TISSUES AND SKULL: No acute skull fracture. No acute soft tissue abnormality. VASCULATURE: Atherosclerotic calcifications are present in the cavernous carotid arteries bilaterally. No hyperdense vessel is present. IMPRESSION: 1. Stable to slightly decreased hyperdense medial  anterior left frontal lobe hemorrhage measuring 4.7 x 2.4 x 2.6 cm with surrounding vasogenic edema and minimal subfalcine herniation. 2. Stable subdural blood along the falx. 3. No new hemorrhage. Electronically signed by: Lonni Necessary MD 06/22/2024 06:40 AM EDT RP Workstation: [id number]   CT HEAD WO CONTRAST ( ) Result Date: 06/21/2024 EXAM: CT HEAD WITHOUT 06/21/2024 05:32:42 AM TECHNIQUE: CT of the head was performed without the administration of intravenous contrast. Automated exposure control, iterative reconstruction, and/or weight based adjustment of the mA/kV was utilized to reduce the radiation dose to as low as reasonably achievable. COMPARISON: CT head without contrast 06/20/2024. CLINICAL HISTORY: Stroke, hemorrhagic. Chief complaints; Neurologic Problem. FINDINGS: BRAIN AND VENTRICLES: Intraparenchymal hemorrhage. The left frontal at the anterior left frontal parafalcine intraparenchymal hemorrhage continues to increase in size, now measuring 4.8 x 2.6 x 2.7 cm. Subdural blood along the falx is stable. Local mass effect with effacement of the sulci and slight subfalcine herniation is stable. No new hemorrhage is present. Periventricular white matter changes are otherwise stable. ORBITS: No acute abnormality. SINUSES AND MASTOIDS: No acute abnormality. SOFT TISSUES AND SKULL: No acute skull fracture. No acute soft tissue abnormality. VASCULATURE: Atherosclerotic calcifications are present in the cavernous carotid arteries bilaterally. No hyperdense vessel is present. IMPRESSION: 1. Increasing size of the left frontal parafalcine intraparenchymal hemorrhage, now measuring 4.8 x 2.6 x 2.7 cm. 2. Stable subdural blood along the falx. 3. Stable local mass effect with effacement of the sulci and slight subfalcine herniation. Electronically signed by: Lonni Necessary MD 06/21/2024 05:57 AM EDT RP Workstation: [id number]   ECHOCARDIOGRAM COMPLETE Result Date: 06/20/2024    ECHOCARDIOGRAM  REPORT   Patient Name:   TINY CHAUDHARY Tomer Date of Exam: 06/20/2024 Medical Rec #:  [id number]           Height:       63.0 in Accession #:    [id number]          Weight:       125.0 lb Date of Birth:  [birth year]           BSA:          1.584 m Patient Age:    78 years            BP:           124/81 mmHg Patient Gender: F                   HR:           66 bpm. Exam Location:  Inpatient Procedure: 2D Echo, Color Doppler and Cardiac Doppler (Both Spectral and Color            Flow Doppler were utilized during procedure). Indications:    Stroke  History:        Patient has prior history of Echocardiogram examinations, most                 recent 06/28/2023.  Sonographer:    Benard Stallion Referring Phys: [id number] ASHISH ARORA IMPRESSIONS  1. Left ventricular ejection fraction, by estimation, is 55 to 60%. The left ventricle has normal function. The left ventricle has no regional wall motion abnormalities. Left ventricular diastolic parameters were normal.  2. Right ventricular systolic function is normal. The right ventricular size is normal. There is normal pulmonary artery systolic pressure.  3. The mitral valve is normal in structure. Trivial mitral valve regurgitation. No evidence of mitral stenosis.  4. The aortic valve is normal in structure. Aortic valve regurgitation is mild. No aortic stenosis is present.  5. The inferior vena cava is normal in size with greater than 50% respiratory variability, suggesting right atrial pressure of 3 mmHg. FINDINGS  Left Ventricle: Left ventricular ejection fraction, by estimation, is 55 to 60%. The left ventricle has normal function. The left ventricle has no regional wall motion abnormalities. The left ventricular internal cavity size was normal in size. There is  no left ventricular hypertrophy. Left ventricular diastolic parameters were normal. Right Ventricle: The right ventricular size is normal. No increase in right ventricular wall thickness. Right ventricular  systolic function is normal. There is normal pulmonary artery systolic pressure. The tricuspid regurgitant velocity is 2.64 m/s, and  with an assumed right atrial pressure of 3 mmHg, the estimated right ventricular systolic pressure is 30.9 mmHg. Left Atrium: Left atrial size was normal in size. Right Atrium: Right atrial size was normal in size. Pericardium: There is no evidence of pericardial effusion. Mitral Valve: The mitral valve is normal in structure. Trivial mitral valve regurgitation. No evidence of mitral valve stenosis. Tricuspid Valve: The tricuspid valve is normal in structure. Tricuspid valve regurgitation is mild . No evidence of tricuspid stenosis. Aortic Valve: The aortic valve is normal in structure. Aortic valve regurgitation is mild. Aortic regurgitation PHT measures 506 msec. No aortic stenosis is present. Aortic valve mean gradient measures 7.0 mmHg. Aortic valve peak gradient measures 12.5 mmHg. Aortic valve area, by VTI measures 2.86 cm. Pulmonic Valve: The pulmonic valve was normal in structure. Pulmonic valve regurgitation is mild. No evidence of pulmonic stenosis. Aorta: The aortic root is normal in size and structure. Venous: The inferior vena cava is normal in size with greater than 50% respiratory variability, suggesting right atrial pressure of 3 mmHg. IAS/Shunts: No atrial level shunt detected by color flow Doppler.  LEFT VENTRICLE PLAX 2D LVIDd:         4.70 cm   Diastology LVIDs:         2.80 cm   LV e' medial:    6.68 cm/s LV PW:         0.80 cm   LV E/e' medial:  10.9 LV IVS:        0.80 cm   LV e' lateral:   10.90 cm/s LVOT diam:     2.10 cm   LV E/e' lateral: 6.7 LV SV:         101 LV SV Index:   64 LVOT Area:     3.46 cm  RIGHT VENTRICLE RV Basal diam:  3.60 cm RV Mid diam:    3.30 cm RV S prime:     23.60 cm/s LEFT ATRIUM  Index        RIGHT ATRIUM           Index LA diam:        3.30 cm 2.08 cm/m   RA Area:     14.70 cm LA Vol (A2C):   41.4 ml 26.14 ml/m  RA  Volume:   35.90 ml  22.67 ml/m LA Vol (A4C):   36.9 ml 23.30 ml/m LA Biplane Vol: 39.0 ml 24.63 ml/m  AORTIC VALVE AV Area (Vmax):    3.19 cm AV Area (Vmean):   2.74 cm AV Area (VTI):     2.86 cm AV Vmax:           177.00 cm/s AV Vmean:          118.667 cm/s AV VTI:            0.354 m AV Peak Grad:      12.5 mmHg AV Mean Grad:      7.0 mmHg LVOT Vmax:         163.00 cm/s LVOT Vmean:        93.800 cm/s LVOT VTI:          0.293 m LVOT/AV VTI ratio: 0.83 AI PHT:            506 msec  AORTA Ao Root diam: 2.90 cm Ao Asc diam:  3.30 cm MITRAL VALVE               TRICUSPID VALVE MV Area (PHT): 4.06 cm    TR Peak grad:   27.9 mmHg MV Decel Time: 187 msec    TR Vmax:        264.00 cm/s MV E velocity: 72.60 cm/s MV A velocity: 90.70 cm/s  SHUNTS MV E/A ratio:  0.80        Systemic VTI:  0.29 m                            Systemic Diam: 2.10 cm Morene Brownie Electronically signed by Morene Brownie Signature Date/Time: 06/20/2024/11:15:52 AM    Final    CT HEAD POST STROKE FOLLOWUP/TIMED/STAT READ Result Date: 06/20/2024 EXAM: CT HEAD WITHOUT CONTRAST 06/20/2024 05:37:14 AM TECHNIQUE: CT of the head was performed without the administration of intravenous contrast. Automated exposure control, iterative reconstruction, and/or weight based adjustment of the mA/kV was utilized to reduce the radiation dose to as low as reasonably achievable. COMPARISON: None available. CLINICAL HISTORY: Neuro deficit, acute, stroke suspected. Follow up stroke. Left frontal hemorrhage. FINDINGS: BRAIN AND VENTRICLES: The anterior medial left frontal lobe hemorrhage has increased in size since the previous study. The hemorrhage now measures 4.4 x 2.5 x 2.5 cm. It measured 3.7 x 1.9 x 1.3 cm on the first CT scan yesterday. Vasogenic edema is present with local mass effect. Parafalcine subdural hemorrhage is stable. Periventricular white matter changes are similar to the prior study. No new hemorrhage or ischemic infarct is present. ORBITS: No  acute abnormality. SINUSES: No acute abnormality. SOFT TISSUES AND SKULL: No acute soft tissue abnormality. No skull fracture. IMPRESSION: 1. Increased size of the anterior medial left frontal lobe hemorrhage, now measuring 4.4 x 2.5 x 2.5 cm, with associated vasogenic edema and local mass effect. 2. Stable parafalcine subdural hemorrhage. 3. No new hemorrhage or ischemic infarct. 4. Periventricular white matter changes similar to the prior study. The pertinent results were texted to Dr. Jerrie via the [facility] system at 5:54 am. Electronically signed  by: Lonni Necessary MD 06/20/2024 05:55 AM EDT RP Workstation: [id number]   MR BRAIN W WO CONTRAST Result Date: 06/19/2024 CLINICAL DATA:  Stroke, hemorrhagic EXAM: MRI HEAD WITHOUT AND WITH CONTRAST TECHNIQUE: Multiplanar, multiecho pulse sequences of the brain and surrounding structures were obtained without and with intravenous contrast. CONTRAST:  6mL GADAVIST  GADOBUTROL  1 MMOL/ML IV SOLN COMPARISON:  CT of the head dated June 19, 2024 an MRI of the brain dated June 28, 2023. FINDINGS: Brain: An ovoid intraparenchymal hemorrhage is again demonstrated medially within the left frontal lobe measuring approximately the 3.1 x 1.9 x 2.7 cm. There is no evidence of underlying mass or vascular malformation. There is trace edema around the periphery of the hematoma. There is an adjacent left parafalcine subdural hematoma, measuring up to 4 mm in thickness. There is age related cerebral volume loss and mild to moderate cerebral white matter disease. There is no abnormal parenchymal or meningeal enhancement. There is residual hemosiderin staining present in the left precentral sulcus. Vascular: Normal arterial and venous flow voids. Skull and upper cervical spine: Normal marrow signal. No osseous lesions are evident. Sinuses/Orbits: Normal orbits. Mild mucosal disease within the left maxillary sinus. Other: None. IMPRESSION: 1. Left frontal lobe cerebral hematoma without  significant interval change. No evidence of underlying mass or vascular malformation. 2. Stable left parafalcine subdural hematoma. 3. Chronic hemosiderin staining within the left precentral sulcus from remote subarachnoid hemorrhage. Electronically Signed   By: Evalene Coho M.D.   On: 06/19/2024 15:01   Chest Port 1 View Result Date: 06/19/2024 CLINICAL DATA:  Aspiration into airway. EXAM: PORTABLE CHEST 1 VIEW COMPARISON:  None Available. FINDINGS: The heart size and mediastinal contours are within normal limits. Both lungs are clear. The visualized skeletal structures are unremarkable. IMPRESSION: No active disease. Electronically Signed   By: Lynwood Landy Raddle M.D.   On: 06/19/2024 12:18   CT ANGIO HEAD NECK W WO CM (CODE STROKE) Result Date: 06/19/2024 CLINICAL DATA:  Neuro deficit, acute, stroke suspected. Right-sided weakness. Stroke, hemorrhagic. EXAM: CT ANGIOGRAPHY HEAD AND NECK WITH AND WITHOUT CONTRAST CT VENOGRAM HEAD TECHNIQUE: Multidetector CT imaging of the head and neck was performed using the standard protocol during bolus administration of intravenous contrast. Multiplanar CT image reconstructions and MIPs were obtained to evaluate the vascular anatomy. Carotid stenosis measurements (when applicable) are obtained utilizing NASCET criteria, using the distal internal carotid diameter as the denominator. Venographic phase images of the brain were obtained following the administration of intravenous contrast. Multiplanar reformats and maximum intensity projections were generated. RADIATION DOSE REDUCTION: This exam was performed according to the departmental dose-optimization program which includes automated exposure control, adjustment of the mA and/or kV according to patient size and/or use of iterative reconstruction technique. CONTRAST:  75mL OMNIPAQUE  IOHEXOL  350 MG/ML SOLN COMPARISON:  Noncontrast head CT performed earlier today 06/19/2024. CT angiogram head/neck 06/28/2023. FINDINGS:  CTA NECK FINDINGS Aortic arch: Common origin of the innominate and left common carotid arteries. The left vertebral artery arises directly from the aortic arch. Atherosclerotic plaque within the visible aortic arch. Streak/beam hardening artifact arising from a dense contrast bolus partially obscures the right subclavian artery. Within this limitation, there is no appreciable hemodynamically significant innominate or proximal subclavian artery stenosis. Right carotid system: CCA and ICA patent within the neck without stenosis or significant atherosclerotic disease. Left carotid system: CCA and ICA patent within the neck without stenosis or significant atherosclerotic disease. Vertebral arteries: The left vertebral artery is non dominant and developmentally diminutive, but  patent throughout the neck. The left vertebral artery is patent within the neck without stenosis or significant atherosclerotic disease. Skeleton: No acute fracture or aggressive osseous lesion. Spondylosis at the cervical and visualized upper thoracic levels. Facet ankylosis on the left at C3-C4 and on the right at C4-C5. Mild chronic T2 superior endplate vertebral compression deformity, unchanged. Mild grade 1 anterolisthesis at C6-C7, T1-T2 and T2-T3. Other neck: Thyroid  nodules measuring up to 12 mm, not meeting consensus criteria for ultrasound follow-up based on size. No follow-up imaging recommended. Reference: J Am Coll Radiol. 2015 Feb;12(2): 143-50. Upper chest: No consolidation within the imaged lung apices. Review of the MIP images confirms the above findings CTA HEAD FINDINGS Anterior circulation: The intracranial internal carotid arteries are patent. The M1 middle cerebral arteries are patent. No M2 proximal branch occlusion or high-grade proximal stenosis. The anterior cerebral arteries are patent. Hypoplastic right A1 segment. No intracranial aneurysm is identified. Posterior circulation: The intracranial right vertebral artery is  patent. The left vertebral artery remains developmentally diminutive intracranially and predominantly supplies the left PICA. The basilar artery is patent. The posterior cerebral arteries are patent. Hypoplastic right P1 segment with sizable right posterior communicating artery. A posterior communicating artery is also present on the left. Venous sinuses: Separately reported on same day CT venogram head. Anatomic variants: As described. Other: No spot sign identified in the region of the left frontal lobe hemorrhage on delayed imaging. Review of the MIP images confirms the above findings CT VENOGRAM HEAD FINDINGS The superior sagittal sinus, internal cerebral veins, vein of Galen, straight sinus, transverse sinuses, sigmoid sinuses and visualized jugular veins are patent. There is no appreciable intracranial venous thrombosis. No emergent large vessel occlusion identified. These results were communicated to Dr. Voncile at 10:20 amon 6/24/2025by text page via the [facility] messaging system. IMPRESSION: CTA neck: 1. The common carotid and internal carotid arteries are patent within the neck without stenosis or significant atherosclerotic disease. 2. The right vertebral artery is patent within the neck without stenosis or significant atherosclerotic disease. 3. The left vertebral artery is non-dominant and developing diminutive, but patent throughout the neck. 4. Aortic Atherosclerosis (ICD10-I70.0). CTA head: 1. No proximal intracranial large vessel occlusion or high-grade proximal arterial stenosis identified. 2. No evidence of an intracranial aneurysm or arteriovenous malformation. CT venogram head: No evidence of dural venous sinus thrombosis. Electronically Signed   By: Rockey Childs D.O.   On: 06/19/2024 10:21   CT VENOGRAM HEAD Result Date: 06/19/2024 CLINICAL DATA:  Neuro deficit, acute, stroke suspected. Right-sided weakness. Stroke, hemorrhagic. EXAM: CT ANGIOGRAPHY HEAD AND NECK WITH AND WITHOUT CONTRAST CT  VENOGRAM HEAD TECHNIQUE: Multidetector CT imaging of the head and neck was performed using the standard protocol during bolus administration of intravenous contrast. Multiplanar CT image reconstructions and MIPs were obtained to evaluate the vascular anatomy. Carotid stenosis measurements (when applicable) are obtained utilizing NASCET criteria, using the distal internal carotid diameter as the denominator. Venographic phase images of the brain were obtained following the administration of intravenous contrast. Multiplanar reformats and maximum intensity projections were generated. RADIATION DOSE REDUCTION: This exam was performed according to the departmental dose-optimization program which includes automated exposure control, adjustment of the mA and/or kV according to patient size and/or use of iterative reconstruction technique. CONTRAST:  75mL OMNIPAQUE  IOHEXOL  350 MG/ML SOLN COMPARISON:  Noncontrast head CT performed earlier today 06/19/2024. CT angiogram head/neck 06/28/2023. FINDINGS: CTA NECK FINDINGS Aortic arch: Common origin of the innominate and left common carotid arteries. The left vertebral  artery arises directly from the aortic arch. Atherosclerotic plaque within the visible aortic arch. Streak/beam hardening artifact arising from a dense contrast bolus partially obscures the right subclavian artery. Within this limitation, there is no appreciable hemodynamically significant innominate or proximal subclavian artery stenosis. Right carotid system: CCA and ICA patent within the neck without stenosis or significant atherosclerotic disease. Left carotid system: CCA and ICA patent within the neck without stenosis or significant atherosclerotic disease. Vertebral arteries: The left vertebral artery is non dominant and developmentally diminutive, but patent throughout the neck. The left vertebral artery is patent within the neck without stenosis or significant atherosclerotic disease. Skeleton: No acute  fracture or aggressive osseous lesion. Spondylosis at the cervical and visualized upper thoracic levels. Facet ankylosis on the left at C3-C4 and on the right at C4-C5. Mild chronic T2 superior endplate vertebral compression deformity, unchanged. Mild grade 1 anterolisthesis at C6-C7, T1-T2 and T2-T3. Other neck: Thyroid  nodules measuring up to 12 mm, not meeting consensus criteria for ultrasound follow-up based on size. No follow-up imaging recommended. Reference: J Am Coll Radiol. 2015 Feb;12(2): 143-50. Upper chest: No consolidation within the imaged lung apices. Review of the MIP images confirms the above findings CTA HEAD FINDINGS Anterior circulation: The intracranial internal carotid arteries are patent. The M1 middle cerebral arteries are patent. No M2 proximal branch occlusion or high-grade proximal stenosis. The anterior cerebral arteries are patent. Hypoplastic right A1 segment. No intracranial aneurysm is identified. Posterior circulation: The intracranial right vertebral artery is patent. The left vertebral artery remains developmentally diminutive intracranially and predominantly supplies the left PICA. The basilar artery is patent. The posterior cerebral arteries are patent. Hypoplastic right P1 segment with sizable right posterior communicating artery. A posterior communicating artery is also present on the left. Venous sinuses: Separately reported on same day CT venogram head. Anatomic variants: As described. Other: No spot sign identified in the region of the left frontal lobe hemorrhage on delayed imaging. Review of the MIP images confirms the above findings CT VENOGRAM HEAD FINDINGS The superior sagittal sinus, internal cerebral veins, vein of Galen, straight sinus, transverse sinuses, sigmoid sinuses and visualized jugular veins are patent. There is no appreciable intracranial venous thrombosis. No emergent large vessel occlusion identified. These results were communicated to Dr. Voncile at  10:20 amon 6/24/2025by text page via the [facility] messaging system. IMPRESSION: CTA neck: 1. The common carotid and internal carotid arteries are patent within the neck without stenosis or significant atherosclerotic disease. 2. The right vertebral artery is patent within the neck without stenosis or significant atherosclerotic disease. 3. The left vertebral artery is non-dominant and developing diminutive, but patent throughout the neck. 4. Aortic Atherosclerosis (ICD10-I70.0). CTA head: 1. No proximal intracranial large vessel occlusion or high-grade proximal arterial stenosis identified. 2. No evidence of an intracranial aneurysm or arteriovenous malformation. CT venogram head: No evidence of dural venous sinus thrombosis. Electronically Signed   By: Rockey Childs D.O.   On: 06/19/2024 10:21   CT HEAD CODE STROKE WO CONTRAST Result Date: 06/19/2024 CLINICAL DATA:  Code stroke. Neuro deficit, acute, stroke suspected. Right-sided weakness. EXAM: CT HEAD WITHOUT CONTRAST TECHNIQUE: Contiguous axial images were obtained from the base of the skull through the vertex without intravenous contrast. RADIATION DOSE REDUCTION: This exam was performed according to the departmental dose-optimization program which includes automated exposure control, adjustment of the mA and/or kV according to patient size and/or use of iterative reconstruction technique. COMPARISON:  Head CT 03/09/2024.  CT angiogram head/neck 06/28/2023. FINDINGS: Brain: No  age-advanced or lobar predominant cerebral atrophy. 3.8 x 1.3 x 1.4 cm (3.5 mL) acute parenchymal hemorrhage within the anteromedial left frontal lobe. Acute subdural hemorrhage along the left aspect of the falx (measuring up to 5 mm in thickness). Patchy and ill-defined hypoattenuation within the cerebral white matter, nonspecific but compatible with mild chronic small vessel ischemic disease. No evidence of an intracranial mass. No midline shift or hydrocephalus. Vascular: No hyperdense  vessel.  Atherosclerotic calcifications. Skull: No calvarial fracture or aggressive osseous lesion. Sinuses/Orbits: No mass or acute finding within the imaged orbits. Minimal mucosal thickening within the left maxillary sinus at the imaged levels. These results were called by telephone at the time of interpretation on 06/19/2024 at 9:50 am to provider Dr. Voncile, who verbally acknowledged these results. IMPRESSION: 1. 3.8 x 1.3 x 1.4 cm (3.5 mL) acute parenchymal hemorrhage within the anteromedial left frontal lobe. 2. Acute subdural hemorrhage along the left aspect of the falx (measuring up to 5 mm in thickness). 3. No midline shift. 4. Mild cerebral white matter chronic small vessel ischemic disease. Electronically Signed   By: Rockey Childs D.O.   On: 06/19/2024 09:51    Microbiology: Results for orders placed or performed during the hospital encounter of 06/19/24  MRSA Next Gen by PCR, Nasal     Status: None   Collection Time: 06/19/24  3:52 PM   Specimen: Nasal Mucosa; Nasal Swab  Result Value Ref Range Status   MRSA by PCR Next Gen NOT DETECTED NOT DETECTED Final    Comment: (NOTE) The GeneXpert MRSA Assay (FDA approved for NASAL specimens only), is one component of a comprehensive MRSA colonization surveillance program. It is not intended to diagnose MRSA infection nor to guide or monitor treatment for MRSA infections. Test performance is not FDA approved in patients less than 57 years old. Performed at [facility], [address], [place], [place] [address]     Labs: CBC: Recent Labs  Lab 06/19/24 [phone] 06/19/24 0933 06/20/24 0854 06/21/24 0443 06/22/24 0644  WBC 12.5*  --  7.7 6.5 6.6  NEUTROABS 10.1*  --   --   --   --   HGB 12.2 12.6 12.0 11.3* 12.8  HCT 38.0 37.0 36.5 35.0* 39.0  MCV 96.0  --  94.1 94.9 94.0  PLT 259  --  253 241 264   Basic Metabolic Panel: Recent Labs  Lab 06/19/24 0929 06/19/24 0933 06/20/24 0853 06/20/24 0854 06/21/24 0443 06/22/24 0644   NA 141 140 139 139 140 142  K 4.0 3.9  --  3.8 4.1 4.2  CL 105 105  --  104 107 103  CO2 26  --   --  26 22 28   GLUCOSE 118* 114*  --  133* 118* 104*  BUN 22 25*  --  20 15 12   CREATININE 0.83 0.90  --  0.70 0.61 0.68  CALCIUM  9.6  --   --  9.0 8.7* 9.8   Liver Function Tests: Recent Labs  Lab 06/19/24 0929  AST 21  ALT 18  ALKPHOS 42  BILITOT 0.8  PROT 6.6  ALBUMIN 4.1   CBG: Recent Labs  Lab 06/19/24 0926 06/24/24 0616  GLUCAP 111* 105*    Discharge time spent: approximately 35 minutes spent on discharge counseling, evaluation of patient on day of discharge, and coordination of discharge planning with nursing, social work, pharmacy and case management  Signed: Lonni SHAUNNA Dalton, MD [facility] 06/24/2024

## 2024-06-24 NOTE — Progress Notes (Signed)
 RNCM spoke to patient at [facility] and confirmed time with patient for arrival today at [facility] today-patient states 1.  Delane at [facility] notified of arrival time.  RN given phone number to call report to-[phone].

## 2024-07-05 DIAGNOSIS — I10 Essential (primary) hypertension: Secondary | ICD-10-CM | POA: Diagnosis not present

## 2024-07-05 DIAGNOSIS — G4700 Insomnia, unspecified: Secondary | ICD-10-CM | POA: Diagnosis not present

## 2024-07-05 DIAGNOSIS — E785 Hyperlipidemia, unspecified: Secondary | ICD-10-CM | POA: Diagnosis not present

## 2024-07-05 DIAGNOSIS — Z8673 Personal history of transient ischemic attack (TIA), and cerebral infarction without residual deficits: Secondary | ICD-10-CM | POA: Diagnosis not present

## 2024-07-05 DIAGNOSIS — R413 Other amnesia: Secondary | ICD-10-CM | POA: Diagnosis not present

## 2024-07-05 DIAGNOSIS — I493 Ventricular premature depolarization: Secondary | ICD-10-CM | POA: Diagnosis not present

## 2024-07-05 DIAGNOSIS — G3184 Mild cognitive impairment, so stated: Secondary | ICD-10-CM | POA: Diagnosis not present

## 2024-07-05 DIAGNOSIS — I611 Nontraumatic intracerebral hemorrhage in hemisphere, cortical: Secondary | ICD-10-CM | POA: Diagnosis not present

## 2024-07-05 DIAGNOSIS — R5382 Chronic fatigue, unspecified: Secondary | ICD-10-CM | POA: Diagnosis not present

## 2024-07-06 ENCOUNTER — Telehealth: Payer: Self-pay | Admitting: Neurology

## 2024-07-06 DIAGNOSIS — G459 Transient cerebral ischemic attack, unspecified: Secondary | ICD-10-CM

## 2024-07-06 DIAGNOSIS — G3184 Mild cognitive impairment, so stated: Secondary | ICD-10-CM

## 2024-07-06 DIAGNOSIS — R200 Anesthesia of skin: Secondary | ICD-10-CM

## 2024-07-06 DIAGNOSIS — R413 Other amnesia: Secondary | ICD-10-CM

## 2024-07-06 DIAGNOSIS — I680 Cerebral amyloid angiopathy: Secondary | ICD-10-CM

## 2024-07-06 NOTE — Telephone Encounter (Signed)
 Pt husband call to request earlier appt . Husband states that PT  was discharged June 29 , and was directed to be seen within 2 month  with Neurologist . Also Husband states that ER  states Pt should get MRI done and wanted to know if MD can schedule that appt as well

## 2024-07-09 ENCOUNTER — Other Ambulatory Visit: Payer: Self-pay | Admitting: Neurology

## 2024-07-09 DIAGNOSIS — I611 Nontraumatic intracerebral hemorrhage in hemisphere, cortical: Secondary | ICD-10-CM

## 2024-07-10 ENCOUNTER — Telehealth: Payer: Self-pay | Admitting: Neurology

## 2024-07-10 NOTE — Telephone Encounter (Signed)
 no auth required sent to GI [phone]

## 2024-07-11 ENCOUNTER — Other Ambulatory Visit: Payer: Self-pay | Admitting: Neurology

## 2024-07-11 DIAGNOSIS — I611 Nontraumatic intracerebral hemorrhage in hemisphere, cortical: Secondary | ICD-10-CM

## 2024-07-11 NOTE — Addendum Note (Signed)
 Addended by: DELFINO AUGUSTIN BROCKS on: 07/11/2024 02:01 PM   Modules accepted: Orders

## 2024-07-11 NOTE — Telephone Encounter (Signed)
 GI said they need a new order please, the current one expires 8/14 so it won't let them schedule it after that.

## 2024-07-11 NOTE — Telephone Encounter (Signed)
 New order placed.

## 2024-07-17 DIAGNOSIS — I69198 Other sequelae of nontraumatic intracerebral hemorrhage: Secondary | ICD-10-CM | POA: Diagnosis not present

## 2024-07-17 DIAGNOSIS — M6281 Muscle weakness (generalized): Secondary | ICD-10-CM | POA: Diagnosis not present

## 2024-07-17 DIAGNOSIS — R2689 Other abnormalities of gait and mobility: Secondary | ICD-10-CM | POA: Diagnosis not present

## 2024-07-19 ENCOUNTER — Encounter: Payer: Self-pay | Admitting: Neurology

## 2024-07-19 ENCOUNTER — Telehealth: Payer: Self-pay | Admitting: Neurology

## 2024-07-19 NOTE — Telephone Encounter (Signed)
 Pt husband called stating that PT had seizure June of this year  and need to be seen sooner . Husband stated that wife is not doing good at all . Pt husband would like a call from Md  or nurse to request earlier appt .

## 2024-07-20 NOTE — Telephone Encounter (Signed)
 Spoke w/Pt husband regarding phone message and [facility] message. Husband stated Pt did not have a seizure but is having worsening issues with her memory. Husband stated Pt also had issues with BP going up after being out of the hospital and cardiologist started Pt back on losartan  50 mg 0.5 tab in AM and 1 tab in PM. He stated BP has been better but he is concerned about her memory issues as her short term and long term seem to be worsening. Pt is scheduled for MRI on 08/21/24 but unable to get an appt with Dr. Rosemarie before 10/10/24. Husband stated Dr. Rosemarie had commented he wanted to see Pt in two months after hospitalization. Voiced understanding to husband but in looking at the schedule Dr. Rosemarie does not have any openings before 10/15. Informed husband will send a message to MD asking if he has any reservations about Pt seeing NP if they have availability after MRI is completed so that it can be discussed as well. Husband voiced understanding and thanks for checking for earlier appt and the call back.

## 2024-07-23 DIAGNOSIS — D225 Melanocytic nevi of trunk: Secondary | ICD-10-CM | POA: Diagnosis not present

## 2024-07-23 DIAGNOSIS — L309 Dermatitis, unspecified: Secondary | ICD-10-CM | POA: Diagnosis not present

## 2024-07-23 DIAGNOSIS — Z85828 Personal history of other malignant neoplasm of skin: Secondary | ICD-10-CM | POA: Diagnosis not present

## 2024-07-23 DIAGNOSIS — L814 Other melanin hyperpigmentation: Secondary | ICD-10-CM | POA: Diagnosis not present

## 2024-07-23 DIAGNOSIS — Z85820 Personal history of malignant melanoma of skin: Secondary | ICD-10-CM | POA: Diagnosis not present

## 2024-07-23 DIAGNOSIS — D1801 Hemangioma of skin and subcutaneous tissue: Secondary | ICD-10-CM | POA: Diagnosis not present

## 2024-07-23 DIAGNOSIS — L400 Psoriasis vulgaris: Secondary | ICD-10-CM | POA: Diagnosis not present

## 2024-07-23 DIAGNOSIS — D692 Other nonthrombocytopenic purpura: Secondary | ICD-10-CM | POA: Diagnosis not present

## 2024-07-23 DIAGNOSIS — L821 Other seborrheic keratosis: Secondary | ICD-10-CM | POA: Diagnosis not present

## 2024-07-23 NOTE — Telephone Encounter (Signed)
 Attempted to call Pt spouse in regards to an earlier appt. No answer, LVM for call back.

## 2024-07-26 DIAGNOSIS — I69198 Other sequelae of nontraumatic intracerebral hemorrhage: Secondary | ICD-10-CM | POA: Diagnosis not present

## 2024-07-27 ENCOUNTER — Ambulatory Visit: Attending: Student in an Organized Health Care Education/Training Program

## 2024-07-27 DIAGNOSIS — R41841 Cognitive communication deficit: Secondary | ICD-10-CM | POA: Insufficient documentation

## 2024-07-27 NOTE — Patient Instructions (Signed)

## 2024-07-27 NOTE — Therapy (Signed)
 OUTPATIENT SPEECH LANGUAGE PATHOLOGY EVALUATION   Patient Name: Chelsea Barnett MRN: [id number] DOB:September 25, 1946, 78 y.o., female Today's Date: 07/27/2024  PCP: Loreli Fallow, MD REFERRING PROVIDER: Lenetta Dover, MD (ref), Doc sent to PCP  END OF SESSION:  End of Session - 07/27/24 1304     Visit Number 1    Number of Visits 17    Date for SLP Re-Evaluation 09/28/24    SLP Start Time 1113    SLP Stop Time  1200    SLP Time Calculation (min) 47 min    Activity Tolerance Patient tolerated treatment well          Past Medical History:  Diagnosis Date   Basal cell cancer    right leg   Heart murmur    Hemorrhoids    Melanoma (HCC) 2005   on left arm   Migraines    with aura   Post-operative nausea and vomiting    after 2 surgeries   Psoriasis    Vaginal delivery    times 6   Past Surgical History:  Procedure Laterality Date   basal cell CA removal     BUNIONECTOMY Right    fallopian tube removal     laparoscopic bso- fibroma   melanoma removal     left upper arm   ovaries removed     laparoscopic bso- fibroma   REFRACTIVE SURGERY     Bil   TUBAL LIGATION  1984   WISDOM TOOTH EXTRACTION     Patient Active Problem List   Diagnosis Date Noted   Hemorrhagic stroke (HCC) 06/19/2024   Right arm weakness 06/28/2023   CVA (cerebral vascular accident) (HCC) 06/28/2023   Essential hypertension 06/28/2023   History of skin cancer 06/28/2023   Nontraumatic subarachnoid hemorrhage (HCC) 06/28/2023   Chest pain on breathing 11/19/2013   Heart murmur, systolic 08/29/2013   Pure hypercholesterolemia 06/01/2012   Edema 06/01/2012   Weight loss 06/01/2012   Psoriasis 06/01/2012    ONSET DATE: 06/19/24   REFERRING DIAG: Lenetta Dover, MD  THERAPY DIAG:  Cognitive communication deficit  Rationale for Evaluation and Treatment: Rehabilitation  SUBJECTIVE:   SUBJECTIVE STATEMENT: I don't remember. [name] do you remember?  Pt accompanied by: significant  other husband [name]  PERTINENT HISTORY: Arrived at ED 6/24 with R sided weakness. CTH showed acute parenchymal hemorrhage within the anterior medial L frontal lobe along with an acute SDH along the L aspect of the falx. PMH includes TIA (deemed to be amyloid spells after further neuro w/u), prior CVA, cerebral hemorrhage possibly amyloid angiopathy   PAIN:  Are you having pain? No  FALLS: Has patient fallen in last 6 months?  Number of falls: two (with CVA, and one in IPR (per documentation))  LIVING ENVIRONMENT: Lives with: lives with their spouse Lives in: House/apartment  PLOF:  Level of assistance: Independent with ADLs, Independent with IADLs Employment: Other: not working  PATIENT GOALS: Improve cognition  OBJECTIVE:  Note: Objective measures were completed at Evaluation unless otherwise noted.  DIAGNOSTIC FINDINGS:  SLE 06/19/24 Pt reports being independent with all tasks at baseline. She scored WFL on all subsections of the Cognistat except delayed recall and calculations, both of which she reports having difficulty with PTA. Discussed at least brief SLP f/u to target functional task of medication management, with which pt and her daughter are agreeable. Will continue following.  MRI 06/19/24 IMPRESSION: 1. Left frontal lobe cerebral hematoma without significant interval change. No evidence of underlying mass or  vascular malformation. 2. Stable left parafalcine subdural hematoma. 3. Chronic hemosiderin staining within the left precentral sulcus from remote subarachnoid hemorrhage.  COGNITION: Overall cognitive status: Impaired Areas of impairment:  Attention: Impaired: Selective, Alternating, Divided Memory: Impaired: Short term Awareness: Impaired: Intellectual Executive function: Impaired: Problem solving, Organization, Planning, and Error awareness Functional deficits: Pt has made coffee and omitted the water or omitted the grounds, pt could not name any deficits  other than memory, given multiple opportunities she could not independently tell SLP how to ensure she would remember to call the pharmacy about refills.  Pt req'd cues to select her attention when answering simple questions at least three times during eval today - SLP suspects internal distractions  COGNITIVE COMMUNICATION: Following directions: Follows multi-step commands inconsistently  Auditory comprehension: Impaired: inasmuch as attention is impaired Verbal expression: WFL Functional communication: WFL  ORAL MOTOR EXAMINATION: Overall status: WFL  STANDARDIZED ASSESSMENTS: Hopkins Verbal Learning Test - RECALL: 4/12 (below WNL), 6/12 (below WNL), 7/12 (below WNL) RECOGNITION: 9 (>3 standard dev below mean). These scores indicate there may well be an attention component to pt's memory disorder and/or that she has difficulty encoding memories at this time.   PATIENT REPORTED OUTCOME MEASURES (PROM): Cognitive Function: provided to pt at first 2 sessions                                                                                                                            TREATMENT DATE:   07/27/24: SLP discussed results of pt's HVLT, explained rationale for further assessment, provided memory strategies which will need to be reviewed at first visit, explained some home tasks, and provided information re: memory and attention and interplay between them.  PATIENT EDUCATION: Education details: see treatment date Person educated: Patient and Spouse Education method: Explanation, Demonstration, and Handouts Education comprehension: verbalized understanding, returned demonstration, and needs further education   GOALS: Goals reviewed with patient? No  SHORT TERM GOALS: Target date: 08/31/24  Pt will undergo further cognitive testing Baseline: Goal status: INITIAL  2.  Pt will utilize at least 2 memory strategies successfully, with cues to use them, between two  sessions Baseline:  Goal status: INITIAL  3.  Pt will demo knowledge of at least 3 deficit areas in order to provide appropriate compensations, in two sessions Baseline:  Goal status: INITIAL  4.  Pt will demo attention skills appropriate for min complex cognitive linguistic task in min-mod noisy environment for 8 minutes Baseline:  Goal status: INITIAL   LONG TERM GOALS: Target date: 09/28/24  Pt will improve PROM Baseline:  Goal status: INITIAL  2.  Pt will demo attention skills appropriate for alternating between min complex cognitive linguistic tasks in min noisy environment for 10 minutes, in 2 sessions Baseline:  Goal status: INITIAL  3.  Pt will demo ability to perform 3 household tasks with mod I (compensations), between 3 sessions Baseline:  Goal status: INITIAL  4.  Pt will functionally solve  practical problems during cognitive linguistic tasks in 3 ST sessions Baseline:  Goal status: INITIAL   ASSESSMENT:  CLINICAL IMPRESSION: Patient is a 78 y.o. F who was seen today for assessment of cognitive linguistic skills from a lt ICH suffered on 06/19/24. During the evaluation today she demonstrated deficits in memory, attention, awareness, and problem solving. See cognition for more details.   OBJECTIVE IMPAIRMENTS: include attention, memory, awareness, and executive functioning. These impairments are limiting patient from managing medications, managing appointments, managing finances, household responsibilities, ADLs/IADLs, and effectively communicating at home and in community. Factors affecting potential to achieve goals and functional outcome are ability to learn/carryover information and severity of impairments.. Patient will benefit from skilled SLP services to address above impairments and improve overall function.  REHAB POTENTIAL: Good  PLAN:  SLP FREQUENCY: 2x/week  SLP DURATION: 8 weeks  PLANNED INTERVENTIONS: Environmental controls, Cueing hierachy,  Cognitive reorganization, Internal/external aids, Functional tasks, Multimodal communication approach, SLP instruction and feedback, Compensatory strategies, Patient/family education, and [id number] Treatment of speech (30 or 45 min)     Britanny Marksberry, CCC-SLP 07/27/2024, 1:08 PM

## 2024-07-31 ENCOUNTER — Encounter: Payer: Self-pay | Admitting: Neurology

## 2024-07-31 ENCOUNTER — Other Ambulatory Visit (HOSPITAL_COMMUNITY): Payer: Self-pay | Admitting: Cardiology

## 2024-08-02 DIAGNOSIS — I69198 Other sequelae of nontraumatic intracerebral hemorrhage: Secondary | ICD-10-CM | POA: Diagnosis not present

## 2024-08-02 DIAGNOSIS — R2689 Other abnormalities of gait and mobility: Secondary | ICD-10-CM | POA: Diagnosis not present

## 2024-08-02 DIAGNOSIS — R531 Weakness: Secondary | ICD-10-CM | POA: Diagnosis not present

## 2024-08-10 DIAGNOSIS — I69198 Other sequelae of nontraumatic intracerebral hemorrhage: Secondary | ICD-10-CM | POA: Diagnosis not present

## 2024-08-10 DIAGNOSIS — R2689 Other abnormalities of gait and mobility: Secondary | ICD-10-CM | POA: Diagnosis not present

## 2024-08-10 DIAGNOSIS — R531 Weakness: Secondary | ICD-10-CM | POA: Diagnosis not present

## 2024-08-15 DIAGNOSIS — R2689 Other abnormalities of gait and mobility: Secondary | ICD-10-CM | POA: Diagnosis not present

## 2024-08-15 DIAGNOSIS — R531 Weakness: Secondary | ICD-10-CM | POA: Diagnosis not present

## 2024-08-15 DIAGNOSIS — I69198 Other sequelae of nontraumatic intracerebral hemorrhage: Secondary | ICD-10-CM | POA: Diagnosis not present

## 2024-08-15 NOTE — Progress Notes (Unsigned)
 [facility] [address] [place]. [place] [address] [phone]       OFFICE FOLLOW-UP NOTE  Chelsea Barnett Date of Birth:  Mar 09, 1946 Medical Record Number:  [id number]   HPI:   Update 08/16/2024 JM: Patient returns for sooner scheduled visit for hospital follow-up.  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  CTV no venous thrombosis.  CTA head/neck negative LVO.  Repeat CT head on day 2 showed increased size of frontal lobe hemorrhage with some vasogenic edema and local mass effect but clinically appeared well.  Follow-up CT 6/27 showed slight decrease in left frontal lobe hematoma.  Aspirin  held.  She remained on Crestor  for HLD management.  Resumed home antihypertensive regimen.  Husband contacted office after discharge to discuss repeat MRI brain imaging as recommended at hospital discharge.  This is currently scheduled on 8/26.  She was evaluated by SLP 8/1 for cognitive difficulties and has follow-up visit scheduled next week.  Update 04/11/2024 : She returns for follow-up after last visit 6 months ago.  She is accompanied by her husband.  They feel the patient fatigues a lot since her stroke in January 2025.  She has poor appetite and she had trouble sleeping.  She she has been evaluated by primary care physician, cardiology and OB/GYN urologist and psychiatrist.  She has been sleeping well since being started on trazodone.  She is also noted subjective decline in her memory.  Patient and husband feel that this may be related to Crestor  which she is started since her stroke.  Last lipid profile on 09/26/2023 showed LDL-cholesterol to be quite optimal at 37 mg percent.  She is presently on Crestor  10 mg daily and would like to discontinue or reduce the dose.  She is tolerating aspirin  well without  bruising or bleeding.  Her blood pressure is well-controlled.  She has had no recurrent stroke or TIA symptoms.  On cognitive testing today she did poorly on the MoCA and scored 19/30 but did better on the Mini-Mental but she scored 30/30.  Mild testing she had 0/3 recall but did better on Mini-Mental.   Consult visit 04/11/2024 Dr. Rosemarie: Chelsea Barnett is a 78 year old pleasant Caucasian lady seen today for initial office follow-up visit following hospital consultation for TIA in July 2024.  She is accompanied by her husband.  History is obtained from them and review of electronic medical records.  I personally reviewed pertinent available imaging films in PACS.  She has past medical history of migraines with aura, psoriasis, skin cancer who presented on 06/28/2023 with 2 transient episodes of right upper extremity numbness lasting only 1 to 2 minutes.  She denied any weakness though stated her right arm felt heavy.  This does not involve her face or leg.  She had no accompanying headache or any visual disturbance before during or after these episodes.  She had an MRI scan of the brain which showed a small volume acute subarachnoid hemorrhage involving left frontal and parietal lobes and 3 mm acute cortical infarct in the left MCA territory.  There is also questionable 4 mm acute cortical infarct in posterior left frontal lobe versus artifact from adjacent subarachnoid hemorrhage.  There are mild changes of chronic small vessel disease.  There were few areas of punctate chronic microhemorrhages noted in the supratentorial cortex raising concern for possible  amyloid angiopathy versus hypertensive small vessel disease.  CT angiogram of the brain and neck showed no large vessel stenosis or occlusion.  There is mild aortic atherosclerosis noted.  Echocardiogram showed ejection fraction of 60 to 65% left atrium size is normal.  LDL cholesterol 130 mg percent.  Hemoglobin A1c was 5.4.  EEG was normal without seizure  activity.  Patient subsequently underwent 30-day external heart monitor which did not show evidence of paroxysmal A-fib.  Patient was started on aspirin  alone given her microhemorrhages and concern for amyloid angiopathy.  She is tolerating it well with minor bruising and no bleeding.  She states she has had no further episodes of numbness.  She has had no other stroke or TIA symptoms.  She has remote history of migraines but states she has not had them for years.  Her blood pressure is under good control.  She is tolerating Crestor  well without muscle aches and pains until she started taking co-Q10.  Last lipid profile on 09/14/2023 showed LDL cholesterol to have improved to 45 mg percent.  CMP, CBC, vitamin D  and TSH were all normal.  The patient on inquiry admits to mild short-term memory difficulties which she had even before her TIA episodes in appears not to have gotten any worse.  She has trouble remembering names at times but remember them later.  She is still independent in all activities of daily living and managing her own affairs.  She has no new complaints.  ROS:   14 system review of systems is positive for memory difficulties, numbness, forgetfulness and all other systems negative  PMH:  Past Medical History:  Diagnosis Date   Basal cell cancer    right leg   Heart murmur    Hemorrhoids    Melanoma (HCC) 2005   on left arm   Migraines    with aura   Post-operative nausea and vomiting    after 2 surgeries   Psoriasis    Vaginal delivery    times 6    Social History:  Social History   Socioeconomic History   Marital status: Married    Spouse name: Not on file   Number of children: Not on file   Years of education: Not on file   Highest education level: Not on file  Occupational History   Not on file  Tobacco Use   Smoking status: Never   Smokeless tobacco: Never  Vaping Use   Vaping status: Never Used  Substance and Sexual Activity   Alcohol  use: Not Currently     Alcohol /week: 0.0 standard drinks of alcohol    Drug use: No   Sexual activity: Not Currently    Partners: Male    Birth control/protection: Surgical    Comment: BTL, BSO  Other Topics Concern   Not on file  Social History Narrative   Not on file   Social Drivers of Health   Financial Resource Strain: Not on file  Food Insecurity: No Food Insecurity (06/19/2024)   Hunger Vital Sign    Worried About Running Out of Food in the Last Year: Never true    Ran Out of Food in the Last Year: Never true  Transportation Needs: No Transportation Needs (06/19/2024)   PRAPARE - Administrator, Civil Service (Medical): No    Lack of Transportation (Non-Medical): No  Physical Activity: Not on file  Stress: Not on file  Social Connections: Moderately Integrated (06/19/2024)   Social Connection and Isolation Panel  Frequency of Communication with Friends and Family: More than three times a week    Frequency of Social Gatherings with Friends and Family: More than three times a week    Attends Religious Services: Never    Database administrator or Organizations: Yes    Attends Engineer, structural: More than 4 times per year    Marital Status: Married  Catering manager Violence: Not At Risk (06/19/2024)   Humiliation, Afraid, Rape, and Kick questionnaire    Fear of Current or Ex-Partner: No    Emotionally Abused: No    Physically Abused: No    Sexually Abused: No    Medications:   Current Outpatient Medications on File Prior to Visit  Medication Sig Dispense Refill   acetaminophen  (TYLENOL ) 325 MG tablet Take 650 mg by mouth daily as needed for headache, mild pain (pain score 1-3) or moderate pain (pain score 4-6).     Apremilast  (OTEZLA ) 30 MG TABS Take 15 mg in the morning and 30 mg at night.     [Paused] aspirin  81 MG chewable tablet Chew 1 tablet (81 mg total) by mouth daily. 90 tablet 0   bisacodyl  (DULCOLAX) 10 MG suppository Place 1 suppository (10 mg total) rectally  daily as needed for moderate constipation.     losartan  (COZAAR ) 50 MG tablet TAKE ONE TABLET IN THE MORNING AND TAKE 1/2 TABLET EVERY EVENING 45 tablet 3   [Paused] metoprolol  succinate (TOPROL  XL) 25 MG 24 hr tablet Take 1 tablet (25 mg total) by mouth daily. 90 tablet 3   mirtazapine  (REMERON ) 30 MG tablet Take 30 mg by mouth at bedtime.     polyethylene glycol (MIRALAX  / GLYCOLAX ) 17 g packet Take 17 g by mouth daily as needed.     rosuvastatin  (CRESTOR ) 10 MG tablet Take 0.5 tablets (5 mg total) by mouth daily. 45 tablet 3   triamcinolone cream (KENALOG) 0.1 % Apply 1 Application topically as needed (psoriasis).     Vitamin D , Ergocalciferol , (DRISDOL ) 50000 units CAPS capsule Take 50,000 Units by mouth every 7 (seven) days.     No current facility-administered medications on file prior to visit.    Allergies:  No Known Allergies  Physical Exam General: well developed, well nourished, seated, in no evident distress Head: head normocephalic and atraumatic.  Neck: supple with no carotid or supraclavicular bruits Cardiovascular: regular rate and rhythm, no murmurs Musculoskeletal: no deformity Skin:  no rash/petichiae Vascular:  Normal pulses all extremities There were no vitals filed for this visit.  Neurologic Exam Mental Status: Awake and fully alert. Oriented to place and time. Recent and remote memory intact. Attention span, concentration and fund of knowledge appropriate. Mood and affect appropriate.  Diminished recall 2/3.  Clock drawing 4/4.  Able to name 14 animals which can walk on 4 legs.  Montreal oral cognitive assessment score 25/30 Cranial Nerves: Fundoscopic exam reveals sharp disc margins. Pupils equal, briskly reactive to light. Extraocular movements full without nystagmus. Visual fields full to confrontation. Hearing intact. Facial sensation intact. Face, tongue, palate moves normally and symmetrically.  Motor: Normal bulk and tone. Normal strength in all tested  extremity muscles. Sensory.: intact to touch ,pinprick .position and vibratory sensation.  Coordination: Rapid alternating movements normal in all extremities. Finger-to-nose and heel-to-shin performed accurately bilaterally. Gait and Station: Arises from chair without difficulty. Stance is normal. Gait demonstrates normal stride length and balance . Able to heel, toe and tandem walk with moderate difficulty.  Reflexes: 1+ and symmetric.  Toes downgoing.   NIHSS  0 Modified Rankin  1     04/11/2024    1:57 PM 09/26/2023    9:44 AM  Montreal Cognitive Assessment   Visuospatial/ Executive (0/5) 2 5  Naming (0/3) 3 2  Attention: Read list of digits (0/2) 2 2  Attention: Read list of letters (0/1) 1 1  Attention: Serial 7 subtraction starting at 100 (0/3) 1 3  Language: Repeat phrase (0/2) 1 1  Language : Fluency (0/1) 1 1  Abstraction (0/2) 2 2  Delayed Recall (0/5) 0 2  Orientation (0/6) 6 6  Total 19 25       ASSESSMENT: 78 year old Caucasian lady with recent left frontal lobe IPH with falcine SDH in 05/2024 with suspected etiology due to CAA.  Prior 2 transient episodes in 06/2023 of right upper extremity numbness lasting a few minutes possibly amyloid spells rather than TIAs from small vessel disease given abnormal MRI scan showing left frontal subarachnoid hemorrhage and microhemorrhages.  She also has mild age-appropriate cognitive impairment with may be at risk for progression to dementia.     PLAN:   Left frontal SDH CAA Repeat MR brain as scheduled on 8/26 Continue to hold aspirin  until that time  Mild cognitive impairment:  MOCA *** (prior 19/30) Continue working with SLP as scheduled next week, discussed importance of following through with exercises as advised      I had a long d/w patient and her husband about her recent TIAs, small stroke, cerebral hemorrhage and possibility of amyloid angiopathy,, risk for recurrent stroke/TIAs, personally independently  reviewed imaging studies and stroke evaluation results and answered questions.Continue aspirin  81 mg daily  for secondary stroke prevention and maintain strict control of hypertension with blood pressure goal below 130/90, diabetes with hemoglobin A1c goal below 6.5% and lipids with LDL cholesterol goal below 70 mg/dL. I also advised the patient to eat a healthy diet with plenty of whole grains, cereals, fruits and vegetables, exercise regularly and maintain ideal body weight .she also has mild cognitive impairment which appears to be age-appropriate.  Recommend she increase participation in cognitively challenging activities like solving crossword puzzles, playing bridge and sudoku.  Patient may be at risk of developing progressive dementia in the future but she is quite functional at this moment hence I would refrain from any further workup at this time.  We also discussed memory compensation strategies.  Followup in the future with me in 6 months or call earlier if necessary. Greater than 50% of time during this prolonged 40  minute visit was spent on counseling,explanation of diagnosis of TIA versus amyloid spells, mild cognitive impairment and amyloid angiopathy, planning of further management, discussion with patient and family and coordination of care   I personally spent a total of *** minutes in the care of the patient today including {Time Based Coding:210964241}.  Harlene Bogaert, AGNP-BC  [facility] [address] [place], [place] [address]  Phone [phone] Fax [phone] Note: This document was prepared with digital dictation and possible smart phrase technology. Any transcriptional errors that result from this process are unintentional.

## 2024-08-16 ENCOUNTER — Ambulatory Visit (INDEPENDENT_AMBULATORY_CARE_PROVIDER_SITE_OTHER): Admitting: Adult Health

## 2024-08-16 ENCOUNTER — Encounter: Payer: Self-pay | Admitting: Adult Health

## 2024-08-16 VITALS — BP 125/78 | HR 87 | Ht 63.5 in | Wt 130.0 lb

## 2024-08-16 DIAGNOSIS — S065XAA Traumatic subdural hemorrhage with loss of consciousness status unknown, initial encounter: Secondary | ICD-10-CM

## 2024-08-16 DIAGNOSIS — G3184 Mild cognitive impairment, so stated: Secondary | ICD-10-CM

## 2024-08-16 DIAGNOSIS — E854 Organ-limited amyloidosis: Secondary | ICD-10-CM

## 2024-08-16 NOTE — Patient Instructions (Signed)
 Your Plan:  Start working with speech therapy as scheduled next week -ensure you continue to do exercises as advised by therapy  Proceed with completing MRI brain as scheduled next week as well  Consider use of Aricept or Namenda in the future if needed     Follow-up with Dr. Rosemarie in October as scheduled     Thank you for coming to see us  at [facility]. I hope we have been able to provide you high quality care today.  You may receive a patient satisfaction survey over the next few weeks. We would appreciate your feedback and comments so that we may continue to improve ourselves and the health of our patients.

## 2024-08-19 NOTE — Progress Notes (Signed)
 I agree with the above plan

## 2024-08-21 ENCOUNTER — Ambulatory Visit
Admission: RE | Admit: 2024-08-21 | Discharge: 2024-08-21 | Disposition: A | Discharge status: Home / Self Care | Source: Ambulatory Visit | Attending: Neurology | Admitting: Neurology

## 2024-08-21 DIAGNOSIS — G459 Transient cerebral ischemic attack, unspecified: Secondary | ICD-10-CM

## 2024-08-21 DIAGNOSIS — G3184 Mild cognitive impairment, so stated: Secondary | ICD-10-CM

## 2024-08-21 DIAGNOSIS — R200 Anesthesia of skin: Secondary | ICD-10-CM

## 2024-08-21 DIAGNOSIS — R413 Other amnesia: Secondary | ICD-10-CM

## 2024-08-21 DIAGNOSIS — I680 Cerebral amyloid angiopathy: Secondary | ICD-10-CM

## 2024-08-21 MED ORDER — GADOPICLENOL 0.5 MMOL/ML IV SOLN
6.0000 mL | Freq: Once | INTRAVENOUS | Status: AC | PRN
  Administered 2024-08-21: 6 mL via INTRAVENOUS

## 2024-08-22 ENCOUNTER — Ambulatory Visit

## 2024-08-22 DIAGNOSIS — R531 Weakness: Secondary | ICD-10-CM | POA: Diagnosis not present

## 2024-08-22 DIAGNOSIS — R41841 Cognitive communication deficit: Secondary | ICD-10-CM

## 2024-08-22 DIAGNOSIS — R2689 Other abnormalities of gait and mobility: Secondary | ICD-10-CM | POA: Diagnosis not present

## 2024-08-22 DIAGNOSIS — I69198 Other sequelae of nontraumatic intracerebral hemorrhage: Secondary | ICD-10-CM | POA: Diagnosis not present

## 2024-08-22 NOTE — Therapy (Signed)
 OUTPATIENT SPEECH LANGUAGE PATHOLOGY TREATMENT   Patient Name: Chelsea Barnett MRN: [id number] DOB:1946-11-21, 78 y.o., female Today's Date: 08/22/2024  PCP: Loreli Fallow, MD REFERRING PROVIDER: Lenetta Dover, MD (ref), Doc sent to PCP  END OF SESSION:  End of Session - 08/22/24 1744     Visit Number 2    Number of Visits 17    Date for SLP Re-Evaluation 09/28/24    SLP Start Time 1406    SLP Stop Time  1446    SLP Time Calculation (min) 40 min    Activity Tolerance Patient tolerated treatment well           Past Medical History:  Diagnosis Date   Basal cell cancer    right leg   Heart murmur    Hemorrhoids    Melanoma (HCC) 2005   on left arm   Migraines    with aura   Post-operative nausea and vomiting    after 2 surgeries   Psoriasis    Vaginal delivery    times 6   Past Surgical History:  Procedure Laterality Date   basal cell CA removal     BUNIONECTOMY Right    fallopian tube removal     laparoscopic bso- fibroma   melanoma removal     left upper arm   ovaries removed     laparoscopic bso- fibroma   REFRACTIVE SURGERY     Bil   TUBAL LIGATION  1984   WISDOM TOOTH EXTRACTION     Patient Active Problem List   Diagnosis Date Noted   Hemorrhagic stroke (HCC) 06/19/2024   Right arm weakness 06/28/2023   CVA (cerebral vascular accident) (HCC) 06/28/2023   Essential hypertension 06/28/2023   History of skin cancer 06/28/2023   Nontraumatic subarachnoid hemorrhage (HCC) 06/28/2023   Chest pain on breathing 11/19/2013   Heart murmur, systolic 08/29/2013   Pure hypercholesterolemia 06/01/2012   Edema 06/01/2012   Weight loss 06/01/2012   Psoriasis 06/01/2012    ONSET DATE: 06/19/24   REFERRING DIAG: Lenetta Dover, MD  THERAPY DIAG:  No diagnosis found.  Rationale for Evaluation and Treatment: Rehabilitation  SUBJECTIVE:   SUBJECTIVE STATEMENT: She forgot her meds maybe three times since she's gotten home (d/c). Braden)  Pt  accompanied by: significant other husband [name]  PERTINENT HISTORY: Arrived at ED 6/24 with R sided weakness. CTH showed acute parenchymal hemorrhage within the anterior medial L frontal lobe along with an acute SDH along the L aspect of the falx. PMH includes TIA (deemed to be amyloid spells after further neuro w/u), prior CVA, cerebral hemorrhage possibly amyloid angiopathy   PAIN:  Are you having pain? No  FALLS: Has patient fallen in last 6 months?  Number of falls: two (with CVA, and one in IPR (per documentation))   PATIENT GOALS: Improve cognition  OBJECTIVE:  Note: Objective measures were completed at Evaluation unless otherwise noted.  DIAGNOSTIC FINDINGS:  SLE 06/19/24 Pt reports being independent with all tasks at baseline. She scored WFL on all subsections of the Cognistat except delayed recall and calculations, both of which she reports having difficulty with PTA. Discussed at least brief SLP f/u to target functional task of medication management, with which pt and her daughter are agreeable. Will continue following.  MRI 06/19/24 IMPRESSION: 1. Left frontal lobe cerebral hematoma without significant interval change. No evidence of underlying mass or vascular malformation. 2. Stable left parafalcine subdural hematoma. 3. Chronic hemosiderin staining within the left precentral sulcus from remote subarachnoid  hemorrhage.   STANDARDIZED ASSESSMENTS: Hopkins Verbal Learning Test - RECALL: 4/12 (below WNL), 6/12 (below WNL), 7/12 (below WNL) RECOGNITION: 9 (>3 standard dev below mean). These scores indicate there may well be an attention component to pt's memory disorder and/or that she has difficulty encoding memories at this time.   PATIENT REPORTED OUTCOME MEASURES (PROM): Cognitive Function: provided to pt at first 2 sessions                                                                                                                            TREATMENT DATE:    08/22/24: Pt needs CLQT and PROM next visit. Given pt's cont'd complaints of memory difficulties, SLP reviewed memory strategies with pt and husband. Pt mentioned specific difficulty remembering people's names. SLP and pt/husband collaborated on developing strategy/ies for memory taking AM meds. SLP educated pt and husband about tasks pt could do at home to work with pt's cognitive deficits. Homework for attention and details.   07/27/24: SLP discussed results of pt's HVLT, explained rationale for further assessment, provided memory strategies which will need to be reviewed at first visit, explained some home tasks, and provided information re: memory and attention and interplay between them.  PATIENT EDUCATION: Education details: see treatment date Person educated: Patient and Spouse Education method: Explanation, Demonstration, and Handouts Education comprehension: verbalized understanding, returned demonstration, and needs further education   GOALS: Goals reviewed with patient? No  SHORT TERM GOALS: Target date: 08/31/24  Pt will undergo further cognitive testing Baseline: Goal status: INITIAL  2.  Pt will utilize at least 2 memory strategies successfully, with cues to use them, between two sessions Baseline:  Goal status: INITIAL  3.  Pt will demo knowledge of at least 3 deficit areas in order to provide appropriate compensations, in two sessions Baseline:  Goal status: INITIAL  4.  Pt will demo attention skills appropriate for min complex cognitive linguistic task in min-mod noisy environment for 8 minutes Baseline:  Goal status: INITIAL   LONG TERM GOALS: Target date: 09/28/24  Pt will improve PROM Baseline:  Goal status: INITIAL  2.  Pt will demo attention skills appropriate for alternating between min complex cognitive linguistic tasks in min noisy environment for 10 minutes, in 2 sessions Baseline:  Goal status: INITIAL  3.  Pt will demo ability to perform 3  household tasks with mod I (compensations), between 3 sessions Baseline:  Goal status: INITIAL  4.  Pt will functionally solve practical problems during cognitive linguistic tasks in 3 ST sessions Baseline:  Goal status: INITIAL   ASSESSMENT:  CLINICAL IMPRESSION: Patient is a 78 y.o. F who was seen today for treatment of cognitive linguistic skills from a lt ICH suffered on 06/19/24. During the evaluation she demonstrated deficits in memory, attention, awareness, and problem solving. See treatment date above for today's date for further details on today's session.   OBJECTIVE IMPAIRMENTS: include attention, memory, awareness, and executive functioning. These impairments  are limiting patient from managing medications, managing appointments, managing finances, household responsibilities, ADLs/IADLs, and effectively communicating at home and in community. Factors affecting potential to achieve goals and functional outcome are ability to learn/carryover information and severity of impairments.. Patient will benefit from skilled SLP services to address above impairments and improve overall function.  REHAB POTENTIAL: Good  PLAN:  SLP FREQUENCY: 2x/week  SLP DURATION: 8 weeks  PLANNED INTERVENTIONS: Environmental controls, Cueing hierachy, Cognitive reorganization, Internal/external aids, Functional tasks, Multimodal communication approach, SLP instruction and feedback, Compensatory strategies, Patient/family education, and [id number] Treatment of speech (30 or 45 min)     Ramyah Pankowski, CCC-SLP 08/22/2024, 6:01 PM

## 2024-08-22 NOTE — Patient Instructions (Signed)

## 2024-08-23 ENCOUNTER — Ambulatory Visit (INDEPENDENT_AMBULATORY_CARE_PROVIDER_SITE_OTHER): Payer: Medicare Other | Admitting: Otolaryngology

## 2024-08-24 ENCOUNTER — Ambulatory Visit: Payer: Self-pay | Admitting: Neurology

## 2024-08-28 ENCOUNTER — Ambulatory Visit: Attending: Student in an Organized Health Care Education/Training Program

## 2024-08-28 DIAGNOSIS — R41841 Cognitive communication deficit: Secondary | ICD-10-CM | POA: Insufficient documentation

## 2024-08-28 NOTE — Therapy (Unsigned)
 OUTPATIENT SPEECH LANGUAGE PATHOLOGY TREATMENT   Patient Name: Chelsea Barnett MRN: [id number] DOB:09-06-1946, 78 y.o., female Today's Date: 08/28/2024  PCP: Loreli Fallow, MD REFERRING PROVIDER: Lenetta Dover, MD (ref), Doc sent to PCP  END OF SESSION:  End of Session - 08/28/24 1619     Visit Number 3    Number of Visits 17    Date for SLP Re-Evaluation 09/28/24    SLP Start Time 1619    SLP Stop Time  1700    SLP Time Calculation (min) 41 min    Activity Tolerance Patient tolerated treatment well           Past Medical History:  Diagnosis Date   Basal cell cancer    right leg   Heart murmur    Hemorrhoids    Melanoma (HCC) 2005   on left arm   Migraines    with aura   Post-operative nausea and vomiting    after 2 surgeries   Psoriasis    Vaginal delivery    times 6   Past Surgical History:  Procedure Laterality Date   basal cell CA removal     BUNIONECTOMY Right    fallopian tube removal     laparoscopic bso- fibroma   melanoma removal     left upper arm   ovaries removed     laparoscopic bso- fibroma   REFRACTIVE SURGERY     Bil   TUBAL LIGATION  1984   WISDOM TOOTH EXTRACTION     Patient Active Problem List   Diagnosis Date Noted   Hemorrhagic stroke (HCC) 06/19/2024   Right arm weakness 06/28/2023   CVA (cerebral vascular accident) (HCC) 06/28/2023   Essential hypertension 06/28/2023   History of skin cancer 06/28/2023   Nontraumatic subarachnoid hemorrhage (HCC) 06/28/2023   Chest pain on breathing 11/19/2013   Heart murmur, systolic 08/29/2013   Pure hypercholesterolemia 06/01/2012   Edema 06/01/2012   Weight loss 06/01/2012   Psoriasis 06/01/2012    ONSET DATE: 06/19/24   REFERRING DIAG: Lenetta Dover, MD  THERAPY DIAG:  Cognitive communication deficit  Rationale for Evaluation and Treatment: Rehabilitation  SUBJECTIVE:   SUBJECTIVE STATEMENT: You'll make a sandwich, leave, and the sandwich sits there in the kitchen.  [place])  Pt accompanied by: significant other husband [name]  PERTINENT HISTORY: Arrived at ED 6/24 with R sided weakness. CTH showed acute parenchymal hemorrhage within the anterior medial L frontal lobe along with an acute SDH along the L aspect of the falx. PMH includes TIA (deemed to be amyloid spells after further neuro w/u), prior CVA, cerebral hemorrhage possibly amyloid angiopathy   PAIN:  Are you having pain? No  FALLS: Has patient fallen in last 6 months?  Number of falls: two (with CVA, and one in IPR (per documentation))   PATIENT GOALS: Improve cognition  OBJECTIVE:  Note: Objective measures were completed at Evaluation unless otherwise noted.  DIAGNOSTIC FINDINGS:  SLE 06/19/24 Pt reports being independent with all tasks at baseline. She scored WFL on all subsections of the Cognistat except delayed recall and calculations, both of which she reports having difficulty with PTA. Discussed at least brief SLP f/u to target functional task of medication management, with which pt and her daughter are agreeable. Will continue following.  MRI 06/19/24 IMPRESSION: 1. Left frontal lobe cerebral hematoma without significant interval change. No evidence of underlying mass or vascular malformation. 2. Stable left parafalcine subdural hematoma. 3. Chronic hemosiderin staining within the left precentral sulcus from remote  subarachnoid hemorrhage.   STANDARDIZED ASSESSMENTS: [employer] Verbal Learning Test - RECALL: 4/12 (below WNL), 6/12 (below WNL), 7/12 (below WNL) RECOGNITION: 9 (>3 standard dev below mean). These scores indicate there may well be an attention component to pt's memory disorder and/or that she has difficulty encoding memories at this time.   Cognitive Linguistic Quick Test  AGE - 70-89  The Cognitive Linguistic Quick Test (CLQT) was administered to assess the relative status of five cognitive domains: attention, memory, language, executive functioning, and visuospatial  skills. Scores from 10 tasks were used to estimate severity ratings (standardized for age groups 18-69 years and 70-89 years) for each domain, a clock drawing task, as well as an overall composite severity rating of cognition.      Task Score Criterion Cut Scores  Personal Facts 8/8 8  Symbol Cancellation 12/12 10  Confrontation Naming 10/10 10  Clock Drawing  11/13 11  Story Retelling 6/10 5  Symbol Trails 10/10 6  Generative Naming 6/9 4  Design Memory 5/6 4  Mazes  7/8 4  Design Generation 8/13 5    Cognitive Domain Composite Score Severity Rating  Attention 196/215 WNL  Memory 148/185 WNL  Executive Function 31/40 WNL  Language 32/37 WNL  Visuospatial Skills 93/105 WNL  Clock Drawing  11/13 WNL  Composite Severity Rating  WNL      PATIENT REPORTED OUTCOME MEASURES (PROM): Cognitive Function: provided to pt 08/28/24 and asked to return next session.                                                                                                                            TREATMENT DATE:   08/28/24: CLQT today. Even though pt scored WNL, SLP has noted some s/sx of mild attention deficits, which husband suspects is different from premorbid status. Pt had difficulty with a simple task targeting alternating attention, which is parallel with Steve's comment in s. When pt was asked she did not explain away deficits but did not endorse either. SLP suspects this could be pt's personality as she playfully stated self-deprecating or self-critical comments during the evaluation such as Well I guess I'm not as smart as you two. SLP to keep goals for awareness until ensured pt's awareness is WFL/WNL. Pt and husband both endorse changes in pt's short term memory. SLP to cont to focus on attention and memory in ST. Pt agrees.    08/22/24: Pt needs CLQT and PROM next visit. Given pt's cont'd complaints of memory difficulties, SLP reviewed memory strategies with pt and husband. Pt mentioned  specific difficulty remembering people's names. SLP and pt/husband collaborated on developing strategy/ies for memory taking AM meds. SLP educated pt and husband about tasks pt could do at home to work with pt's cognitive deficits. Homework for attention and details.   07/27/24: SLP discussed results of pt's HVLT, explained rationale for further assessment, provided memory strategies which will need to be reviewed at first visit, explained some home  tasks, and provided information re: memory and attention and interplay between them.  PATIENT EDUCATION: Education details: see treatment date Person educated: Patient and Spouse Education method: Explanation, Demonstration, and Handouts Education comprehension: verbalized understanding, returned demonstration, and needs further education   GOALS: Goals reviewed with patient? No  SHORT TERM GOALS: Target date: 08/31/24  Pt will undergo further cognitive testing Baseline: Goal status: met  2.  Pt will utilize at least 2 memory strategies successfully, with cues to use them, between two sessions Baseline:  Goal status: INITIAL  3.  Pt will demo knowledge of at least 3 deficit areas in order to provide appropriate compensations, in two sessions Baseline:  Goal status: INITIAL  4.  Pt will demo attention skills appropriate for min complex cognitive linguistic task in min-mod noisy environment for 8 minutes Baseline:  Goal status: INITIAL   LONG TERM GOALS: Target date: 09/28/24  Pt will improve PROM Baseline:  Goal status: INITIAL  2.  Pt will demo attention skills appropriate for alternating between min complex cognitive linguistic tasks in min noisy environment for 10 minutes, in 2 sessions Baseline:  Goal status: INITIAL  3.  Pt will demo ability to perform 3 household tasks with mod I (compensations), between 3 sessions Baseline:  Goal status: INITIAL  4.  Pt will functionally solve practical problems during cognitive  linguistic tasks in 3 ST sessions Baseline:  Goal status: INITIAL   ASSESSMENT:  CLINICAL IMPRESSION: Patient is a 78 y.o. F who was seen today for treatment of cognitive linguistic skills from a lt ICH suffered on 06/19/24. Pt underwent CLQT today with results above. Based on testing today, SLP to keep all goals. See treatment date above for today's date for further details on today's session. During the evaluation she demonstrated deficits in memory, attention, awareness, and problem solving.    OBJECTIVE IMPAIRMENTS: include attention, memory, awareness, and executive functioning. These impairments are limiting patient from managing medications, managing appointments, managing finances, household responsibilities, ADLs/IADLs, and effectively communicating at home and in community. Factors affecting potential to achieve goals and functional outcome are ability to learn/carryover information and severity of impairments.. Patient will benefit from skilled SLP services to address above impairments and improve overall function.  REHAB POTENTIAL: Good  PLAN:  SLP FREQUENCY: 2x/week  SLP DURATION: 8 weeks  PLANNED INTERVENTIONS: Environmental controls, Cueing hierachy, Cognitive reorganization, Internal/external aids, Functional tasks, Multimodal communication approach, SLP instruction and feedback, Compensatory strategies, Patient/family education, and 07492 Treatment of speech (30 or 45 min)     Arnoldo Hildreth, CCC-SLP 08/28/2024, 4:19 PM

## 2024-08-30 ENCOUNTER — Encounter (INDEPENDENT_AMBULATORY_CARE_PROVIDER_SITE_OTHER): Payer: Self-pay | Admitting: Otolaryngology

## 2024-08-30 ENCOUNTER — Ambulatory Visit (INDEPENDENT_AMBULATORY_CARE_PROVIDER_SITE_OTHER): Admitting: Otolaryngology

## 2024-08-30 VITALS — BP 145/81 | HR 77

## 2024-08-30 DIAGNOSIS — H903 Sensorineural hearing loss, bilateral: Secondary | ICD-10-CM | POA: Diagnosis not present

## 2024-08-30 NOTE — Progress Notes (Signed)
 Patient ID: Chelsea Barnett, female   DOB: 1946/05/11, 78 y.o.   MRN: [id number]  Follow-up: Bilateral hearing loss  HPI:  The patient is a 78 year old female who returns today for her follow-up evaluation.  She was last seen 1 year ago.  At that time, she was complaining of bilateral progressive hearing loss.  She was noted to have bilateral high-frequency sensorineural hearing loss, worse on the left side.  Her MRI scan was negative for retrocochlear lesion.  The hearing amplification options were discussed, and she was fitted with bilateral hearing aids.  The patient returns today reporting no significant change in her hearing.  She was diagnosed with a CVA last year.  She denies any otalgia, otorrhea, or vertigo.  Exam: General: Communicates without difficulty, well nourished, no acute distress. Head: Normocephalic, no evidence injury, no tenderness, facial buttresses intact without stepoff. Face/sinus: No tenderness to palpation and percussion. Facial movement is normal and symmetric. Eyes: PERRL, EOMI. No scleral icterus, conjunctivae clear. Neuro: CN II exam reveals vision grossly intact.  No nystagmus at any point of gaze. EAC: The ear canals and TMs are noted to be normal. Nose: External evaluation reveals normal support and skin without lesions.  Dorsum is intact.  Anterior rhinoscopy reveals pink mucosa over anterior aspect of inferior turbinates and intact septum.  No purulence noted. Oral:  Oral cavity and oropharynx are intact, symmetric, without erythema or edema.  Mucosa is moist without lesions. Neck: Full range of motion without pain.  There is no significant lymphadenopathy.  No masses palpable.  Thyroid  bed within normal limits to palpation.  Parotid glands and submandibular glands equal bilaterally without mass.  Trachea is midline. Neuro:  CN 2-12 grossly intact. Gait normal.   Assessment: 1.  Subjectively stable bilateral high-frequency sensorineural hearing loss, slightly worse  on the left side.  Her previous MRI scan in 2022 was negative. 2.  Her ear canals, tympanic membranes, and middle ear spaces are normal.  Plan: 1.  The physical exam findings are reviewed with the patient. 2.  Continue the use of her hearing aids. 3.  Outpatient hearing test. 4.  The patient will return for reevaluation in 1 year.

## 2024-08-31 ENCOUNTER — Ambulatory Visit

## 2024-08-31 DIAGNOSIS — R531 Weakness: Secondary | ICD-10-CM | POA: Diagnosis not present

## 2024-08-31 DIAGNOSIS — R41841 Cognitive communication deficit: Secondary | ICD-10-CM | POA: Diagnosis not present

## 2024-08-31 DIAGNOSIS — I69198 Other sequelae of nontraumatic intracerebral hemorrhage: Secondary | ICD-10-CM | POA: Diagnosis not present

## 2024-08-31 DIAGNOSIS — R2689 Other abnormalities of gait and mobility: Secondary | ICD-10-CM | POA: Diagnosis not present

## 2024-08-31 NOTE — Therapy (Signed)
 OUTPATIENT SPEECH LANGUAGE PATHOLOGY TREATMENT   Patient Name: Chelsea Barnett MRN: [id number] DOB:1946/03/23, 78 y.o., female Today's Date: 08/31/2024  PCP: Loreli Fallow, MD REFERRING PROVIDER: Lenetta Dover, MD (ref), Doc sent to PCP  END OF SESSION:  End of Session - 08/31/24 0914     Visit Number 4    Number of Visits 17    Date for SLP Re-Evaluation 09/28/24    SLP Start Time 0854   8  minutes late   SLP Stop Time  0930    SLP Time Calculation (min) 36 min    Activity Tolerance Patient tolerated treatment well           Past Medical History:  Diagnosis Date   Basal cell cancer    right leg   Heart murmur    Hemorrhoids    Melanoma (HCC) 2005   on left arm   Migraines    with aura   Post-operative nausea and vomiting    after 2 surgeries   Psoriasis    Vaginal delivery    times 6   Past Surgical History:  Procedure Laterality Date   basal cell CA removal     BUNIONECTOMY Right    fallopian tube removal     laparoscopic bso- fibroma   melanoma removal     left upper arm   ovaries removed     laparoscopic bso- fibroma   REFRACTIVE SURGERY     Bil   TUBAL LIGATION  1984   WISDOM TOOTH EXTRACTION     Patient Active Problem List   Diagnosis Date Noted   Hemorrhagic stroke (HCC) 06/19/2024   Right arm weakness 06/28/2023   CVA (cerebral vascular accident) (HCC) 06/28/2023   Essential hypertension 06/28/2023   History of skin cancer 06/28/2023   Nontraumatic subarachnoid hemorrhage (HCC) 06/28/2023   Chest pain on breathing 11/19/2013   Heart murmur, systolic 08/29/2013   Pure hypercholesterolemia 06/01/2012   Edema 06/01/2012   Weight loss 06/01/2012   Psoriasis 06/01/2012    ONSET DATE: 06/19/24   REFERRING DIAG: Lenetta Dover, MD  THERAPY DIAG:  Cognitive communication deficit  Rationale for Evaluation and Treatment: Rehabilitation  SUBJECTIVE:   SUBJECTIVE STATEMENT: .  Pt accompanied by: significant other husband  [name]  PERTINENT HISTORY: Arrived at ED 6/24 with R sided weakness. CTH showed acute parenchymal hemorrhage within the anterior medial L frontal lobe along with an acute SDH along the L aspect of the falx. PMH includes TIA (deemed to be amyloid spells after further neuro w/u), prior CVA, cerebral hemorrhage possibly amyloid angiopathy   PAIN:  Are you having pain? No  FALLS: Has patient fallen in last 6 months?  Number of falls: two (with CVA, and one in IPR (per documentation))   PATIENT GOALS: Improve cognition  OBJECTIVE:  Note: Objective measures were completed at Evaluation unless otherwise noted.  DIAGNOSTIC FINDINGS:  SLE 06/19/24 Pt reports being independent with all tasks at baseline. She scored WFL on all subsections of the Cognistat except delayed recall and calculations, both of which she reports having difficulty with PTA. Discussed at least brief SLP f/u to target functional task of medication management, with which pt and her daughter are agreeable. Will continue following.  MRI 06/19/24 IMPRESSION: 1. Left frontal lobe cerebral hematoma without significant interval change. No evidence of underlying mass or vascular malformation. 2. Stable left parafalcine subdural hematoma. 3. Chronic hemosiderin staining within the left precentral sulcus from remote subarachnoid hemorrhage.   STANDARDIZED ASSESSMENTS: [employer]  Learning Test - RECALL: 4/12 (below WNL), 6/12 (below WNL), 7/12 (below WNL) RECOGNITION: 9 (>3 standard dev below mean). These scores indicate there may well be an attention component to pt's memory disorder and/or that she has difficulty encoding memories at this time.   Cognitive Linguistic Quick Test  AGE - 70-89  The Cognitive Linguistic Quick Test (CLQT) was administered to assess the relative status of five cognitive domains: attention, memory, language, executive functioning, and visuospatial skills. Scores from 10 tasks were used to estimate  severity ratings (standardized for age groups 18-69 years and 70-89 years) for each domain, a clock drawing task, as well as an overall composite severity rating of cognition.      Task Score Criterion Cut Scores  Personal Facts 8/8 8  Symbol Cancellation 12/12 10  Confrontation Naming 10/10 10  Clock Drawing  11/13 11  Story Retelling 6/10 5  Symbol Trails 10/10 6  Generative Naming 6/9 4  Design Memory 5/6 4  Mazes  7/8 4  Design Generation 8/13 5    Cognitive Domain Composite Score Severity Rating  Attention 196/215 WNL  Memory 148/185 WNL  Executive Function 31/40 WNL  Language 32/37 WNL  Visuospatial Skills 93/105 WNL  Clock Drawing  11/13 WNL  Composite Severity Rating  WNL      PATIENT REPORTED OUTCOME MEASURES (PROM): Cognitive Function: pt returned today with totaled 81/130 with lower scores indicating less of an impact on pt's daily life/QoL.                                                                                                                             TREATMENT DATE:   08/31/24: SLP discussed results of CLQT with pt and husband - pt's husband with questions about this. When asked, pt stated she was not at baseline and, specifically, I still have trouble remembering things - but that was going on before. SLP reviewed with pt that we are concerned about changes after CVA. Husband stated he thinks pt is at approx 80% of baseline. SLP had pt check her homework as she skipped 3 entries on one handout and did not correct her errors on the other (code from the last letters of the word). Pt req'd assistance with alternating attention with both tasks as she lost her place, and found it again 50% of the time (incr'd to 100% with mod A from SLP).  08/28/24: CLQT today. Even though pt scored WNL, SLP has noted some s/sx of mild attention deficits, which husband suspects is different from premorbid status. Pt had difficulty with a simple task targeting alternating  attention, which is parallel with Steve's comment in s. When pt was asked she did not explain away deficits but did not endorse either. SLP suspects this could be pt's personality as she playfully stated self-deprecating or self-critical comments during the evaluation such as Well I guess I'm not as smart as you two. SLP to keep goals for awareness  until ensured pt's awareness is WFL/WNL. Pt and husband both endorse changes in pt's short term memory. SLP to cont to focus on attention and memory in ST. Pt agrees.    08/22/24: Pt needs CLQT and PROM next visit. Given pt's cont'd complaints of memory difficulties, SLP reviewed memory strategies with pt and husband. Pt mentioned specific difficulty remembering people's names. SLP and pt/husband collaborated on developing strategy/ies for memory taking AM meds. SLP educated pt and husband about tasks pt could do at home to work with pt's cognitive deficits. Homework for attention and details.   07/27/24: SLP discussed results of pt's HVLT, explained rationale for further assessment, provided memory strategies which will need to be reviewed at first visit, explained some home tasks, and provided information re: memory and attention and interplay between them.  PATIENT EDUCATION: Education details: see treatment date Person educated: Patient and Spouse Education method: Explanation, Demonstration, and Handouts Education comprehension: verbalized understanding, returned demonstration, and needs further education   GOALS: Goals reviewed with patient? No  SHORT TERM GOALS: Target date: 08/31/24  Pt will undergo further cognitive testing Baseline: Goal status: met  2.  Pt will utilize at least 2 memory strategies successfully, with cues to use them, between two sessions Baseline:  Goal status: INITIAL  3.  Pt will demo knowledge of at least 3 deficit areas in order to provide appropriate compensations, in two sessions Baseline:  Goal status:  INITIAL  4.  Pt will demo attention skills appropriate for min complex cognitive linguistic task in min-mod noisy environment for 8 minutes Baseline:  Goal status: INITIAL   LONG TERM GOALS: Target date: 09/28/24  Pt will improve PROM Baseline:  Goal status: INITIAL  2.  Pt will demo attention skills appropriate for alternating between min complex cognitive linguistic tasks in min noisy environment for 10 minutes, in 2 sessions Baseline:  Goal status: INITIAL  3.  Pt will demo ability to perform 3 household tasks with mod I (compensations), between 3 sessions Baseline:  Goal status: INITIAL  4.  Pt will functionally solve practical problems during cognitive linguistic tasks in 3 ST sessions Baseline:  Goal status: INITIAL   ASSESSMENT:  CLINICAL IMPRESSION: Patient is a 78 y.o. F who was seen today for treatment of cognitive linguistic skills from a lt ICH suffered on 06/19/24. See treatment date above for today's date for further details on today's session. During the evaluation she demonstrated deficits in memory, attention, awareness, and problem solving.    OBJECTIVE IMPAIRMENTS: include attention, memory, awareness, and executive functioning. These impairments are limiting patient from managing medications, managing appointments, managing finances, household responsibilities, ADLs/IADLs, and effectively communicating at home and in community. Factors affecting potential to achieve goals and functional outcome are ability to learn/carryover information and severity of impairments.. Patient will benefit from skilled SLP services to address above impairments and improve overall function.  REHAB POTENTIAL: Good  PLAN:  SLP FREQUENCY: 2x/week  SLP DURATION: 8 weeks  PLANNED INTERVENTIONS: Environmental controls, Cueing hierachy, Cognitive reorganization, Internal/external aids, Functional tasks, Multimodal communication approach, SLP instruction and feedback, Compensatory  strategies, Patient/family education, and 07492 Treatment of speech (30 or 45 min)     Catelyn Friel, CCC-SLP 08/31/2024, 9:15 AM

## 2024-09-01 DIAGNOSIS — H903 Sensorineural hearing loss, bilateral: Secondary | ICD-10-CM | POA: Insufficient documentation

## 2024-09-05 ENCOUNTER — Ambulatory Visit

## 2024-09-05 DIAGNOSIS — I69198 Other sequelae of nontraumatic intracerebral hemorrhage: Secondary | ICD-10-CM | POA: Diagnosis not present

## 2024-09-05 DIAGNOSIS — R2689 Other abnormalities of gait and mobility: Secondary | ICD-10-CM | POA: Diagnosis not present

## 2024-09-05 DIAGNOSIS — R41841 Cognitive communication deficit: Secondary | ICD-10-CM | POA: Diagnosis not present

## 2024-09-05 DIAGNOSIS — R531 Weakness: Secondary | ICD-10-CM | POA: Diagnosis not present

## 2024-09-05 NOTE — Therapy (Signed)
 OUTPATIENT SPEECH LANGUAGE PATHOLOGY TREATMENT   Patient Name: Chelsea Barnett MRN: [id number] DOB:1946-08-13, 78 y.o., female Today's Date: 09/05/2024  PCP: Loreli Fallow, MD REFERRING PROVIDER: Lenetta Dover, MD (ref), Doc sent to PCP  END OF SESSION:  End of Session - 09/05/24 1901     Visit Number 5    Number of Visits 17    Date for SLP Re-Evaluation 09/28/24    SLP Start Time 1452    SLP Stop Time  1532    SLP Time Calculation (min) 40 min    Activity Tolerance Patient tolerated treatment well           Past Medical History:  Diagnosis Date   Basal cell cancer    right leg   Heart murmur    Hemorrhoids    Melanoma (HCC) 2005   on left arm   Migraines    with aura   Post-operative nausea and vomiting    after 2 surgeries   Psoriasis    Vaginal delivery    times 6   Past Surgical History:  Procedure Laterality Date   basal cell CA removal     BUNIONECTOMY Right    fallopian tube removal     laparoscopic bso- fibroma   melanoma removal     left upper arm   ovaries removed     laparoscopic bso- fibroma   REFRACTIVE SURGERY     Bil   TUBAL LIGATION  1984   WISDOM TOOTH EXTRACTION     Patient Active Problem List   Diagnosis Date Noted   Sensorineural hearing loss, bilateral 09/01/2024   Hemorrhagic stroke (HCC) 06/19/2024   Right arm weakness 06/28/2023   CVA (cerebral vascular accident) (HCC) 06/28/2023   Essential hypertension 06/28/2023   History of skin cancer 06/28/2023   Nontraumatic subarachnoid hemorrhage (HCC) 06/28/2023   Chest pain on breathing 11/19/2013   Heart murmur, systolic 08/29/2013   Pure hypercholesterolemia 06/01/2012   Edema 06/01/2012   Weight loss 06/01/2012   Psoriasis 06/01/2012    ONSET DATE: 06/19/24   REFERRING DIAG: Lenetta Dover, MD  THERAPY DIAG:  No diagnosis found.  Rationale for Evaluation and Treatment: Rehabilitation  SUBJECTIVE:   SUBJECTIVE STATEMENT: I screwed this up. (Pt, re  homework)  Pt accompanied by: significant other husband [name]  PERTINENT HISTORY: Arrived at ED 6/24 with R sided weakness. CTH showed acute parenchymal hemorrhage within the anterior medial L frontal lobe along with an acute SDH along the L aspect of the falx. PMH includes TIA (deemed to be amyloid spells after further neuro w/u), prior CVA, cerebral hemorrhage possibly amyloid angiopathy   PAIN:  Are you having pain? No  FALLS: Has patient fallen in last 6 months?  Number of falls: two (with CVA, and one in IPR (per documentation))   PATIENT GOALS: Improve cognition  OBJECTIVE:  Note: Objective measures were completed at Evaluation unless otherwise noted.  DIAGNOSTIC FINDINGS:  SLE 06/19/24 Pt reports being independent with all tasks at baseline. She scored WFL on all subsections of the Cognistat except delayed recall and calculations, both of which she reports having difficulty with PTA. Discussed at least brief SLP f/u to target functional task of medication management, with which pt and her daughter are agreeable. Will continue following.  MRI 06/19/24 IMPRESSION: 1. Left frontal lobe cerebral hematoma without significant interval change. No evidence of underlying mass or vascular malformation. 2. Stable left parafalcine subdural hematoma. 3. Chronic hemosiderin staining within the left precentral sulcus from remote  subarachnoid hemorrhage.   STANDARDIZED ASSESSMENTS: Hopkins Verbal Learning Test - RECALL: 4/12 (below WNL), 6/12 (below WNL), 7/12 (below WNL) RECOGNITION: 9 (>3 standard dev below mean). These scores indicate there may well be an attention component to pt's memory disorder and/or that she has difficulty encoding memories at this time.   Cognitive Linguistic Quick Test  AGE - 70-89  The Cognitive Linguistic Quick Test (CLQT) was administered to assess the relative status of five cognitive domains: attention, memory, language, executive functioning, and visuospatial  skills. Scores from 10 tasks were used to estimate severity ratings (standardized for age groups 18-69 years and 70-89 years) for each domain, a clock drawing task, as well as an overall composite severity rating of cognition.      Task Score Criterion Cut Scores  Personal Facts 8/8 8  Symbol Cancellation 12/12 10  Confrontation Naming 10/10 10  Clock Drawing  11/13 11  Story Retelling 6/10 5  Symbol Trails 10/10 6  Generative Naming 6/9 4  Design Memory 5/6 4  Mazes  7/8 4  Design Generation 8/13 5    Cognitive Domain Composite Score Severity Rating  Attention 196/215 WNL  Memory 148/185 WNL  Executive Function 31/40 WNL  Language 32/37 WNL  Visuospatial Skills 93/105 WNL  Clock Drawing  11/13 WNL  Composite Severity Rating  WNL      PATIENT REPORTED OUTCOME MEASURES (PROM): Cognitive Function: pt returned today with totaled 81/130 with lower scores indicating less of an impact on pt's daily life/QoL.                                                                                                                             TREATMENT DATE:   09/05/24: SLP guided pt through checking her homework. Multiple errors, approx awareness 60%. Pt with evidence of reduced sustained and selective (internal distraction) attention hindering accuracy, awareness, and problem solving. SLP provided one homework task a second time and asked [name] to assist giving as few hints as possible, allowing pt to do her work as much as possible.   08/31/24: SLP discussed results of CLQT with pt and husband - pt's husband with questions about this. When asked, pt stated she was not at baseline and, specifically, I still have trouble remembering things - but that was going on before. SLP reviewed with pt that we are concerned about changes after CVA. Husband stated he thinks pt is at approx 80% of baseline. SLP had pt check her homework as she skipped 3 entries on one handout and did not correct her errors on the  other (code from the last letters of the word). Pt req'd assistance with alternating attention with both tasks as she lost her place, and found it again 50% of the time (incr'd to 100% with mod A from SLP).  08/28/24: CLQT today. Even though pt scored WNL, SLP has noted some s/sx of mild attention deficits, which husband suspects is different from premorbid status.  Pt had difficulty with a simple task targeting alternating attention, which is parallel with Steve's comment in s. When pt was asked she did not explain away deficits but did not endorse either. SLP suspects this could be pt's personality as she playfully stated self-deprecating or self-critical comments during the evaluation such as Well I guess I'm not as smart as you two. SLP to keep goals for awareness until ensured pt's awareness is WFL/WNL. Pt and husband both endorse changes in pt's short term memory. SLP to cont to focus on attention and memory in ST. Pt agrees.    08/22/24: Pt needs CLQT and PROM next visit. Given pt's cont'd complaints of memory difficulties, SLP reviewed memory strategies with pt and husband. Pt mentioned specific difficulty remembering people's names. SLP and pt/husband collaborated on developing strategy/ies for memory taking AM meds. SLP educated pt and husband about tasks pt could do at home to work with pt's cognitive deficits. Homework for attention and details.   07/27/24: SLP discussed results of pt's HVLT, explained rationale for further assessment, provided memory strategies which will need to be reviewed at first visit, explained some home tasks, and provided information re: memory and attention and interplay between them.  PATIENT EDUCATION: Education details: see treatment date Person educated: Patient and Spouse Education method: Explanation, Demonstration, and Handouts Education comprehension: verbalized understanding, returned demonstration, and needs further education   GOALS: Goals reviewed  with patient? No  SHORT TERM GOALS: Target date:  09/21/24 (due to visit number)  Pt will undergo further cognitive testing Baseline: Goal status: met  2.  Pt will utilize at least 2 memory strategies successfully, with cues to use them, between two sessions Baseline:  Goal status: INITIAL  3.  Pt will demo knowledge of at least 3 deficit areas in order to provide appropriate compensations, in two sessions Baseline:  Goal status: INITIAL  4.  Pt will demo attention skills appropriate for min complex cognitive linguistic task in min-mod noisy environment for 8 minutes Baseline:  Goal status: INITIAL   LONG TERM GOALS: Target date: 09/28/24  Pt will improve PROM Baseline:  Goal status: INITIAL  2.  Pt will demo attention skills appropriate for alternating between min complex cognitive linguistic tasks in min noisy environment for 10 minutes, in 2 sessions Baseline:  Goal status: INITIAL  3.  Pt will demo ability to perform 3 household tasks with mod I (compensations), between 3 sessions Baseline:  Goal status: INITIAL  4.  Pt will functionally solve practical problems during cognitive linguistic tasks in 3 ST sessions Baseline:  Goal status: INITIAL   ASSESSMENT:  CLINICAL IMPRESSION: Patient is a 78 y.o. F who was seen today for treatment of cognitive linguistic skills from a lt ICH suffered on 06/19/24. See treatment date above for today's date for further details on today's session. During the evaluation she demonstrated deficits in memory, attention, awareness, and problem solving.    OBJECTIVE IMPAIRMENTS: include attention, memory, awareness, and executive functioning. These impairments are limiting patient from managing medications, managing appointments, managing finances, household responsibilities, ADLs/IADLs, and effectively communicating at home and in community. Factors affecting potential to achieve goals and functional outcome are ability to learn/carryover  information and severity of impairments.. Patient will benefit from skilled SLP services to address above impairments and improve overall function.  REHAB POTENTIAL: Good  PLAN:  SLP FREQUENCY: 2x/week  SLP DURATION: 8 weeks  PLANNED INTERVENTIONS: Environmental controls, Cueing hierachy, Cognitive reorganization, Internal/external aids, Functional tasks, Multimodal communication approach, SLP  instruction and feedback, Compensatory strategies, Patient/family education, and 07492 Treatment of speech (30 or 45 min)     Evanell Redlich, CCC-SLP 09/05/2024, 7:01 PM

## 2024-09-07 ENCOUNTER — Ambulatory Visit

## 2024-09-07 DIAGNOSIS — R41841 Cognitive communication deficit: Secondary | ICD-10-CM | POA: Diagnosis not present

## 2024-09-07 NOTE — Therapy (Signed)
 OUTPATIENT SPEECH LANGUAGE PATHOLOGY TREATMENT   Patient Name: Chelsea Barnett MRN: [id number] DOB:Nov 27, 1946, 78 y.o., female Today's Date: 09/07/2024  PCP: Loreli Fallow, MD REFERRING PROVIDER: Lenetta Dover, MD (ref), Doc sent to PCP  END OF SESSION:  End of Session - 09/07/24 1147     Visit Number 6    Number of Visits 17    Date for SLP Re-Evaluation 09/28/24    SLP Start Time 1020    SLP Stop Time  1100    SLP Time Calculation (min) 40 min    Activity Tolerance Patient tolerated treatment well            Past Medical History:  Diagnosis Date   Basal cell cancer    right leg   Heart murmur    Hemorrhoids    Melanoma (HCC) 2005   on left arm   Migraines    with aura   Post-operative nausea and vomiting    after 2 surgeries   Psoriasis    Vaginal delivery    times 6   Past Surgical History:  Procedure Laterality Date   basal cell CA removal     BUNIONECTOMY Right    fallopian tube removal     laparoscopic bso- fibroma   melanoma removal     left upper arm   ovaries removed     laparoscopic bso- fibroma   REFRACTIVE SURGERY     Bil   TUBAL LIGATION  1984   WISDOM TOOTH EXTRACTION     Patient Active Problem List   Diagnosis Date Noted   Sensorineural hearing loss, bilateral 09/01/2024   Hemorrhagic stroke (HCC) 06/19/2024   Right arm weakness 06/28/2023   CVA (cerebral vascular accident) (HCC) 06/28/2023   Essential hypertension 06/28/2023   History of skin cancer 06/28/2023   Nontraumatic subarachnoid hemorrhage (HCC) 06/28/2023   Chest pain on breathing 11/19/2013   Heart murmur, systolic 08/29/2013   Pure hypercholesterolemia 06/01/2012   Edema 06/01/2012   Weight loss 06/01/2012   Psoriasis 06/01/2012    ONSET DATE: 06/19/24   REFERRING DIAG: Lenetta Dover, MD  THERAPY DIAG:  Cognitive communication deficit  Rationale for Evaluation and Treatment: Rehabilitation  SUBJECTIVE:   SUBJECTIVE STATEMENT: Oh, no, that's got 6  letters. It's supposed to be 6 OR MORE letters.  Pt accompanied by: significant other husband [name]  PERTINENT HISTORY: Arrived at ED 6/24 with R sided weakness. CTH showed acute parenchymal hemorrhage within the anterior medial L frontal lobe along with an acute SDH along the L aspect of the falx. PMH includes TIA (deemed to be amyloid spells after further neuro w/u), prior CVA, cerebral hemorrhage possibly amyloid angiopathy   PAIN:  Are you having pain? No  FALLS: Has patient fallen in last 6 months?  Number of falls: two (with CVA, and one in IPR (per documentation))   PATIENT GOALS: Improve cognition  OBJECTIVE:  Note: Objective measures were completed at Evaluation unless otherwise noted.  DIAGNOSTIC FINDINGS:  SLE 06/19/24 Pt reports being independent with all tasks at baseline. She scored WFL on all subsections of the Cognistat except delayed recall and calculations, both of which she reports having difficulty with PTA. Discussed at least brief SLP f/u to target functional task of medication management, with which pt and her daughter are agreeable. Will continue following.  MRI 06/19/24 IMPRESSION: 1. Left frontal lobe cerebral hematoma without significant interval change. No evidence of underlying mass or vascular malformation. 2. Stable left parafalcine subdural hematoma. 3. Chronic hemosiderin  staining within the left precentral sulcus from remote subarachnoid hemorrhage.   STANDARDIZED ASSESSMENTS: Hopkins Verbal Learning Test - RECALL: 4/12 (below WNL), 6/12 (below WNL), 7/12 (below WNL) RECOGNITION: 9 (>3 standard dev below mean). These scores indicate there may well be an attention component to pt's memory disorder and/or that she has difficulty encoding memories at this time.   Cognitive Linguistic Quick Test  AGE - 70-89  The Cognitive Linguistic Quick Test (CLQT) was administered to assess the relative status of five cognitive domains: attention, memory, language,  executive functioning, and visuospatial skills. Scores from 10 tasks were used to estimate severity ratings (standardized for age groups 18-69 years and 70-89 years) for each domain, a clock drawing task, as well as an overall composite severity rating of cognition.      Task Score Criterion Cut Scores  Personal Facts 8/8 8  Symbol Cancellation 12/12 10  Confrontation Naming 10/10 10  Clock Drawing  11/13 11  Story Retelling 6/10 5  Symbol Trails 10/10 6  Generative Naming 6/9 4  Design Memory 5/6 4  Mazes  7/8 4  Design Generation 8/13 5    Cognitive Domain Composite Score Severity Rating  Attention 196/215 WNL  Memory 148/185 WNL  Executive Function 31/40 WNL  Language 32/37 WNL  Visuospatial Skills 93/105 WNL  Clock Drawing  11/13 WNL  Composite Severity Rating  WNL      PATIENT REPORTED OUTCOME MEASURES (PROM): Cognitive Function: pt returned today with totaled 81/130 with lower scores indicating less of an impact on pt's daily life/QoL.                                                                                                                             TREATMENT DATE:   09/07/24: Pt with 87% success initially with detailed written instructions. Awareness 77% of the time of errors. Pt req'd cues for double checking work. SLP and pt and husband talked for a while about games and tasks pt can practice at home that would be beneficial for cognition as husbnad is going away for a week and friends will be checking in on [name] regularly in the afternoons (attendant there in the mornings). Pt is using calendar regularly and notes at home. Has to go back to the calendar regularly and does not necessarily recall information there.   09/05/24: SLP guided pt through checking her homework. Multiple errors, approx awareness 60%. Pt with evidence of reduced sustained and selective (internal distraction) attention hindering accuracy, awareness, and problem solving. SLP provided one homework  task a second time and asked [name] to assist giving as few hints as possible, allowing pt to do her work as much as possible.   08/31/24: SLP discussed results of CLQT with pt and husband - pt's husband with questions about this. When asked, pt stated she was not at baseline and, specifically, I still have trouble remembering things - but that was going on before. SLP reviewed with  pt that we are concerned about changes after CVA. Husband stated he thinks pt is at approx 80% of baseline. SLP had pt check her homework as she skipped 3 entries on one handout and did not correct her errors on the other (code from the last letters of the word). Pt req'd assistance with alternating attention with both tasks as she lost her place, and found it again 50% of the time (incr'd to 100% with mod A from SLP).  08/28/24: CLQT today. Even though pt scored WNL, SLP has noted some s/sx of mild attention deficits, which husband suspects is different from premorbid status. Pt had difficulty with a simple task targeting alternating attention, which is parallel with Steve's comment in s. When pt was asked she did not explain away deficits but did not endorse either. SLP suspects this could be pt's personality as she playfully stated self-deprecating or self-critical comments during the evaluation such as Well I guess I'm not as smart as you two. SLP to keep goals for awareness until ensured pt's awareness is WFL/WNL. Pt and husband both endorse changes in pt's short term memory. SLP to cont to focus on attention and memory in ST. Pt agrees.    08/22/24: Pt needs CLQT and PROM next visit. Given pt's cont'd complaints of memory difficulties, SLP reviewed memory strategies with pt and husband. Pt mentioned specific difficulty remembering people's names. SLP and pt/husband collaborated on developing strategy/ies for memory taking AM meds. SLP educated pt and husband about tasks pt could do at home to work with pt's cognitive  deficits. Homework for attention and details.   07/27/24: SLP discussed results of pt's HVLT, explained rationale for further assessment, provided memory strategies which will need to be reviewed at first visit, explained some home tasks, and provided information re: memory and attention and interplay between them.  PATIENT EDUCATION: Education details: see treatment date Person educated: Patient and Spouse Education method: Explanation, Demonstration, and Handouts Education comprehension: verbalized understanding, returned demonstration, and needs further education   GOALS: Goals reviewed with patient? No  SHORT TERM GOALS: Target date:  09/21/24 (due to visit number)  Pt will undergo further cognitive testing Baseline: Goal status: met  2.  Pt will utilize at least 2 memory strategies successfully, with cues to use them, between two sessions Baseline: 09/07/24 Goal status: INITIAL  3.  Pt will demo knowledge of at least 3 deficit areas in order to provide appropriate compensations, in two sessions Baseline:  Goal status: INITIAL  4.  Pt will demo attention skills appropriate for min complex cognitive linguistic task in min-mod noisy environment for 8 minutes Baseline:  Goal status: INITIAL   LONG TERM GOALS: Target date: 09/28/24  Pt will improve PROM Baseline:  Goal status: INITIAL  2.  Pt will demo attention skills appropriate for alternating between min complex cognitive linguistic tasks in min noisy environment for 10 minutes, in 2 sessions Baseline:  Goal status: INITIAL  3.  Pt will demo ability to perform 3 household tasks with mod I (compensations), between 3 sessions Baseline:  Goal status: INITIAL  4.  Pt will functionally solve practical problems during cognitive linguistic tasks in 3 ST sessions Baseline:  Goal status: INITIAL   ASSESSMENT:  CLINICAL IMPRESSION: Patient is a 78 y.o. F who was seen today for treatment of cognitive linguistic skills  from a lt ICH suffered on 06/19/24. See treatment date above for today's date for further details on today's session. During the evaluation she demonstrated deficits  in memory, attention, awareness, and problem solving.    OBJECTIVE IMPAIRMENTS: include attention, memory, awareness, and executive functioning. These impairments are limiting patient from managing medications, managing appointments, managing finances, household responsibilities, ADLs/IADLs, and effectively communicating at home and in community. Factors affecting potential to achieve goals and functional outcome are ability to learn/carryover information and severity of impairments.. Patient will benefit from skilled SLP services to address above impairments and improve overall function.  REHAB POTENTIAL: Good  PLAN:  SLP FREQUENCY: 2x/week  SLP DURATION: 8 weeks  PLANNED INTERVENTIONS: Environmental controls, Cueing hierachy, Cognitive reorganization, Internal/external aids, Functional tasks, Multimodal communication approach, SLP instruction and feedback, Compensatory strategies, Patient/family education, and 07492 Treatment of speech (30 or 45 min)     Dodger Sinning, CCC-SLP 09/07/2024, 11:47 AM

## 2024-09-11 ENCOUNTER — Ambulatory Visit

## 2024-09-11 DIAGNOSIS — R41841 Cognitive communication deficit: Secondary | ICD-10-CM | POA: Diagnosis not present

## 2024-09-11 NOTE — Therapy (Signed)
 OUTPATIENT SPEECH LANGUAGE PATHOLOGY TREATMENT   Patient Name: Chelsea Barnett MRN: [id number] DOB:1946/07/18, 78 y.o., female Today's Date: 09/11/2024  PCP: Loreli Fallow, MD REFERRING PROVIDER: Lenetta Dover, MD (ref), Doc sent to PCP  END OF SESSION:  End of Session - 09/11/24 1538     Visit Number 7    Number of Visits 17    Date for SLP Re-Evaluation 09/28/24    SLP Start Time 1451    SLP Stop Time  1531    SLP Time Calculation (min) 40 min    Activity Tolerance Patient tolerated treatment well            Past Medical History:  Diagnosis Date   Basal cell cancer    right leg   Heart murmur    Hemorrhoids    Melanoma (HCC) 2005   on left arm   Migraines    with aura   Post-operative nausea and vomiting    after 2 surgeries   Psoriasis    Vaginal delivery    times 6   Past Surgical History:  Procedure Laterality Date   basal cell CA removal     BUNIONECTOMY Right    fallopian tube removal     laparoscopic bso- fibroma   melanoma removal     left upper arm   ovaries removed     laparoscopic bso- fibroma   REFRACTIVE SURGERY     Bil   TUBAL LIGATION  1984   WISDOM TOOTH EXTRACTION     Patient Active Problem List   Diagnosis Date Noted   Sensorineural hearing loss, bilateral 09/01/2024   Hemorrhagic stroke (HCC) 06/19/2024   Right arm weakness 06/28/2023   CVA (cerebral vascular accident) (HCC) 06/28/2023   Essential hypertension 06/28/2023   History of skin cancer 06/28/2023   Nontraumatic subarachnoid hemorrhage (HCC) 06/28/2023   Chest pain on breathing 11/19/2013   Heart murmur, systolic 08/29/2013   Pure hypercholesterolemia 06/01/2012   Edema 06/01/2012   Weight loss 06/01/2012   Psoriasis 06/01/2012    ONSET DATE: 06/19/24   REFERRING DIAG: Lenetta Dover, MD  THERAPY DIAG:  No diagnosis found.  Rationale for Evaluation and Treatment: Rehabilitation  SUBJECTIVE:   SUBJECTIVE STATEMENT: Oh, no, that's got 6 letters.  It's supposed to be 6 OR MORE letters.  Pt accompanied by: significant other husband [name]  PERTINENT HISTORY: Arrived at ED 6/24 with R sided weakness. CTH showed acute parenchymal hemorrhage within the anterior medial L frontal lobe along with an acute SDH along the L aspect of the falx. PMH includes TIA (deemed to be amyloid spells after further neuro w/u), prior CVA, cerebral hemorrhage possibly amyloid angiopathy   PAIN:  Are you having pain? No  FALLS: Has patient fallen in last 6 months?  Number of falls: two (with CVA, and one in IPR (per documentation))   PATIENT GOALS: Improve cognition  OBJECTIVE:  Note: Objective measures were completed at Evaluation unless otherwise noted.  DIAGNOSTIC FINDINGS:  SLE 06/19/24 Pt reports being independent with all tasks at baseline. She scored WFL on all subsections of the Cognistat except delayed recall and calculations, both of which she reports having difficulty with PTA. Discussed at least brief SLP f/u to target functional task of medication management, with which pt and her daughter are agreeable. Will continue following.  MRI 06/19/24 IMPRESSION: 1. Left frontal lobe cerebral hematoma without significant interval change. No evidence of underlying mass or vascular malformation. 2. Stable left parafalcine subdural hematoma. 3. Chronic hemosiderin  staining within the left precentral sulcus from remote subarachnoid hemorrhage.   STANDARDIZED ASSESSMENTS: Hopkins Verbal Learning Test - RECALL: 4/12 (below WNL), 6/12 (below WNL), 7/12 (below WNL) RECOGNITION: 9 (>3 standard dev below mean). These scores indicate there may well be an attention component to pt's memory disorder and/or that she has difficulty encoding memories at this time.   Cognitive Linguistic Quick Test  AGE - 70-89  The Cognitive Linguistic Quick Test (CLQT) was administered to assess the relative status of five cognitive domains: attention, memory, language, executive  functioning, and visuospatial skills. Scores from 10 tasks were used to estimate severity ratings (standardized for age groups 18-69 years and 70-89 years) for each domain, a clock drawing task, as well as an overall composite severity rating of cognition.      Task Score Criterion Cut Scores  Personal Facts 8/8 8  Symbol Cancellation 12/12 10  Confrontation Naming 10/10 10  Clock Drawing  11/13 11  Story Retelling 6/10 5  Symbol Trails 10/10 6  Generative Naming 6/9 4  Design Memory 5/6 4  Mazes  7/8 4  Design Generation 8/13 5    Cognitive Domain Composite Score Severity Rating  Attention 196/215 WNL  Memory 148/185 WNL  Executive Function 31/40 WNL  Language 32/37 WNL  Visuospatial Skills 93/105 WNL  Clock Drawing  11/13 WNL  Composite Severity Rating  WNL      PATIENT REPORTED OUTCOME MEASURES (PROM): Cognitive Function: pt returned today with totaled 81/130 with lower scores indicating less of an impact on pt's daily life/QoL.                                                                                                                             TREATMENT DATE:   09/11/24: SLP assisted pt with homework corrections. Pt success 85% overall, but when she double checked her errors her success improved to 95%. She was aware of 4/6 errors. Extra time consistently necessary for details and problem solving. In an alternating attention min-mod detailed task she req'd extra time consistently, and occasional min A for attention to detail. Homework provided.   09/07/24: Pt with 87% success initially with detailed written instructions. Awareness 77% of the time of errors. Pt req'd cues for double checking work. SLP and pt and husband talked for a while about games and tasks pt can practice at home that would be beneficial for cognition as husbnad is going away for a week and friends will be checking in on [name] regularly in the afternoons (attendant there in the mornings). Pt is using  calendar regularly and notes at home. Has to go back to the calendar regularly and does not necessarily recall information there.   09/05/24: SLP guided pt through checking her homework. Multiple errors, approx awareness 60%. Pt with evidence of reduced sustained and selective (internal distraction) attention hindering accuracy, awareness, and problem solving. SLP provided one homework task a second time and asked [name] to assist  giving as few hints as possible, allowing pt to do her work as much as possible.   08/31/24: SLP discussed results of CLQT with pt and husband - pt's husband with questions about this. When asked, pt stated she was not at baseline and, specifically, I still have trouble remembering things - but that was going on before. SLP reviewed with pt that we are concerned about changes after CVA. Husband stated he thinks pt is at approx 80% of baseline. SLP had pt check her homework as she skipped 3 entries on one handout and did not correct her errors on the other (code from the last letters of the word). Pt req'd assistance with alternating attention with both tasks as she lost her place, and found it again 50% of the time (incr'd to 100% with mod A from SLP).  08/28/24: CLQT today. Even though pt scored WNL, SLP has noted some s/sx of mild attention deficits, which husband suspects is different from premorbid status. Pt had difficulty with a simple task targeting alternating attention, which is parallel with Steve's comment in s. When pt was asked she did not explain away deficits but did not endorse either. SLP suspects this could be pt's personality as she playfully stated self-deprecating or self-critical comments during the evaluation such as Well I guess I'm not as smart as you two. SLP to keep goals for awareness until ensured pt's awareness is WFL/WNL. Pt and husband both endorse changes in pt's short term memory. SLP to cont to focus on attention and memory in ST. Pt agrees.     08/22/24: Pt needs CLQT and PROM next visit. Given pt's cont'd complaints of memory difficulties, SLP reviewed memory strategies with pt and husband. Pt mentioned specific difficulty remembering people's names. SLP and pt/husband collaborated on developing strategy/ies for memory taking AM meds. SLP educated pt and husband about tasks pt could do at home to work with pt's cognitive deficits. Homework for attention and details.   07/27/24: SLP discussed results of pt's HVLT, explained rationale for further assessment, provided memory strategies which will need to be reviewed at first visit, explained some home tasks, and provided information re: memory and attention and interplay between them.  PATIENT EDUCATION: Education details: see treatment date Person educated: Patient and Spouse Education method: Explanation, Demonstration, and Handouts Education comprehension: verbalized understanding, returned demonstration, and needs further education   GOALS: Goals reviewed with patient? No  SHORT TERM GOALS: Target date:  09/21/24 (due to visit number)  Pt will undergo further cognitive testing Baseline: Goal status: met  2.  Pt will utilize at least 2 memory strategies successfully, with cues to use them, between two sessions Baseline: 09/07/24 Goal status: INITIAL  3.  Pt will demo knowledge of at least 3 deficit areas in order to provide appropriate compensations, in two sessions Baseline:  Goal status: INITIAL  4.  Pt will demo attention skills appropriate for min complex cognitive linguistic task in min-mod noisy environment for 8 minutes Baseline:  Goal status: INITIAL   LONG TERM GOALS: Target date: 09/28/24  Pt will improve PROM Baseline:  Goal status: INITIAL  2.  Pt will demo attention skills appropriate for alternating between min complex cognitive linguistic tasks in min noisy environment for 10 minutes, in 2 sessions Baseline:  Goal status: INITIAL  3.  Pt will demo  ability to perform 3 household tasks with mod I (compensations), between 3 sessions Baseline:  Goal status: INITIAL  4.  Pt will functionally solve practical  problems during cognitive linguistic tasks in 3 ST sessions Baseline:  Goal status: INITIAL   ASSESSMENT:  CLINICAL IMPRESSION: Patient is a 78 y.o. F who was seen today for treatment of cognitive linguistic skills from a lt ICH suffered on 06/19/24. See treatment date above for today's date for further details on today's session. During the evaluation she demonstrated deficits in memory, attention, awareness, and problem solving.    OBJECTIVE IMPAIRMENTS: include attention, memory, awareness, and executive functioning. These impairments are limiting patient from managing medications, managing appointments, managing finances, household responsibilities, ADLs/IADLs, and effectively communicating at home and in community. Factors affecting potential to achieve goals and functional outcome are ability to learn/carryover information and severity of impairments.. Patient will benefit from skilled SLP services to address above impairments and improve overall function.  REHAB POTENTIAL: Good  PLAN:  SLP FREQUENCY: 2x/week  SLP DURATION: 8 weeks  PLANNED INTERVENTIONS: Environmental controls, Cueing hierachy, Cognitive reorganization, Internal/external aids, Functional tasks, Multimodal communication approach, SLP instruction and feedback, Compensatory strategies, Patient/family education, and 07492 Treatment of speech (30 or 45 min)     Salif Tay, CCC-SLP 09/11/2024, 3:38 PM

## 2024-09-12 DIAGNOSIS — R531 Weakness: Secondary | ICD-10-CM | POA: Diagnosis not present

## 2024-09-12 DIAGNOSIS — R2689 Other abnormalities of gait and mobility: Secondary | ICD-10-CM | POA: Diagnosis not present

## 2024-09-12 DIAGNOSIS — I69198 Other sequelae of nontraumatic intracerebral hemorrhage: Secondary | ICD-10-CM | POA: Diagnosis not present

## 2024-09-14 ENCOUNTER — Encounter

## 2024-09-17 DIAGNOSIS — I69198 Other sequelae of nontraumatic intracerebral hemorrhage: Secondary | ICD-10-CM | POA: Diagnosis not present

## 2024-09-17 DIAGNOSIS — R2689 Other abnormalities of gait and mobility: Secondary | ICD-10-CM | POA: Diagnosis not present

## 2024-09-17 DIAGNOSIS — R531 Weakness: Secondary | ICD-10-CM | POA: Diagnosis not present

## 2024-09-18 ENCOUNTER — Ambulatory Visit

## 2024-09-18 DIAGNOSIS — R41841 Cognitive communication deficit: Secondary | ICD-10-CM

## 2024-09-18 NOTE — Therapy (Signed)
 OUTPATIENT SPEECH LANGUAGE PATHOLOGY TREATMENT   Patient Name: Chelsea Barnett MRN: [id number] DOB:1946-09-18, 78 y.o., female Today's Date: 09/18/2024  PCP: Loreli Fallow, MD REFERRING PROVIDER: Lenetta Dover, MD (ref), Doc sent to PCP  END OF SESSION:  End of Session - 09/18/24 1449     Visit Number 8    Number of Visits 17    Date for Recertification  09/28/24    SLP Start Time 1449    SLP Stop Time  1530    SLP Time Calculation (min) 41 min    Activity Tolerance Patient tolerated treatment well            Past Medical History:  Diagnosis Date   Basal cell cancer    right leg   Heart murmur    Hemorrhoids    Melanoma (HCC) 2005   on left arm   Migraines    with aura   Post-operative nausea and vomiting    after 2 surgeries   Psoriasis    Vaginal delivery    times 6   Past Surgical History:  Procedure Laterality Date   basal cell CA removal     BUNIONECTOMY Right    fallopian tube removal     laparoscopic bso- fibroma   melanoma removal     left upper arm   ovaries removed     laparoscopic bso- fibroma   REFRACTIVE SURGERY     Bil   TUBAL LIGATION  1984   WISDOM TOOTH EXTRACTION     Patient Active Problem List   Diagnosis Date Noted   Sensorineural hearing loss, bilateral 09/01/2024   Hemorrhagic stroke (HCC) 06/19/2024   Right arm weakness 06/28/2023   CVA (cerebral vascular accident) (HCC) 06/28/2023   Essential hypertension 06/28/2023   History of skin cancer 06/28/2023   Nontraumatic subarachnoid hemorrhage (HCC) 06/28/2023   Chest pain on breathing 11/19/2013   Heart murmur, systolic 08/29/2013   Pure hypercholesterolemia 06/01/2012   Edema 06/01/2012   Weight loss 06/01/2012   Psoriasis 06/01/2012    ONSET DATE: 06/19/24   REFERRING DIAG: Lenetta Dover, MD  THERAPY DIAG:  Cognitive communication deficit  Rationale for Evaluation and Treatment: Rehabilitation  SUBJECTIVE:   SUBJECTIVE STATEMENT: We checked these  together - a family member and I.  Pt accompanied by: significant other husband [name]  PERTINENT HISTORY: Arrived at ED 6/24 with R sided weakness. CTH showed acute parenchymal hemorrhage within the anterior medial L frontal lobe along with an acute SDH along the L aspect of the falx. PMH includes TIA (deemed to be amyloid spells after further neuro w/u), prior CVA, cerebral hemorrhage possibly amyloid angiopathy   PAIN:  Are you having pain? No  FALLS: Has patient fallen in last 6 months?  Number of falls: two (with CVA, and one in IPR (per documentation))   PATIENT GOALS: Improve cognition  OBJECTIVE:  Note: Objective measures were completed at Evaluation unless otherwise noted.  DIAGNOSTIC FINDINGS:  SLE 06/19/24 Pt reports being independent with all tasks at baseline. She scored WFL on all subsections of the Cognistat except delayed recall and calculations, both of which she reports having difficulty with PTA. Discussed at least brief SLP f/u to target functional task of medication management, with which pt and her daughter are agreeable. Will continue following.  MRI 06/19/24 IMPRESSION: 1. Left frontal lobe cerebral hematoma without significant interval change. No evidence of underlying mass or vascular malformation. 2. Stable left parafalcine subdural hematoma. 3. Chronic hemosiderin staining within the left  precentral sulcus from remote subarachnoid hemorrhage.   STANDARDIZED ASSESSMENTS: Hopkins Verbal Learning Test - RECALL: 4/12 (below WNL), 6/12 (below WNL), 7/12 (below WNL) RECOGNITION: 9 (>3 standard dev below mean). These scores indicate there may well be an attention component to pt's memory disorder and/or that she has difficulty encoding memories at this time.   Cognitive Linguistic Quick Test  AGE - 70-89  The Cognitive Linguistic Quick Test (CLQT) was administered to assess the relative status of five cognitive domains: attention, memory, language, executive  functioning, and visuospatial skills. Scores from 10 tasks were used to estimate severity ratings (standardized for age groups 18-69 years and 70-89 years) for each domain, a clock drawing task, as well as an overall composite severity rating of cognition.      Task Score Criterion Cut Scores  Personal Facts 8/8 8  Symbol Cancellation 12/12 10  Confrontation Naming 10/10 10  Clock Drawing  11/13 11  Story Retelling 6/10 5  Symbol Trails 10/10 6  Generative Naming 6/9 4  Design Memory 5/6 4  Mazes  7/8 4  Design Generation 8/13 5    Cognitive Domain Composite Score Severity Rating  Attention 196/215 WNL  Memory 148/185 WNL  Executive Function 31/40 WNL  Language 32/37 WNL  Visuospatial Skills 93/105 WNL  Clock Drawing  11/13 WNL  Composite Severity Rating  WNL      PATIENT REPORTED OUTCOME MEASURES (PROM): Cognitive Function: pt returned today with totaled 81/130 with lower scores indicating less of an impact on pt's daily life/QoL.                                                                                                                             TREATMENT DATE:   09/18/24: SLP assisted pt in checking her homework. Pt was 88% accurate with her responses.- three errors remained. Pt req'd mod-max A usually to correct. SLP highly encouraged pt to cook something she knows (Christmas toffee, and granola) for homework for attention and details. [name] to assist with toffee, and CNA to assist with granola. SLP told [name] and pt that CNA should not TELL [name] what she 's done wrong just alert her she's done something wrong to see if she can correct spontaneously without any more cues. [name] still asks [name] multiple times (three today) when the appointments are instead of spontaneously using her calendar.   09/11/24: SLP assisted pt with homework corrections. Pt success 85% overall, but when she double checked her errors her success improved to 95%. She was aware of 4/6 errors. Extra time  consistently necessary for details and problem solving. In an alternating attention min-mod detailed task she req'd extra time consistently, and occasional min A for attention to detail. Homework provided.   09/07/24: Pt with 87% success initially with detailed written instructions. Awareness 77% of the time of errors. Pt req'd cues for double checking work. SLP and pt and husband talked for a while about games and tasks  pt can practice at home that would be beneficial for cognition as husbnad is going away for a week and friends will be checking in on [name] regularly in the afternoons (attendant there in the mornings). Pt is using calendar regularly and notes at home. Has to go back to the calendar regularly and does not necessarily recall information there.   09/05/24: SLP guided pt through checking her homework. Multiple errors, approx awareness 60%. Pt with evidence of reduced sustained and selective (internal distraction) attention hindering accuracy, awareness, and problem solving. SLP provided one homework task a second time and asked [name] to assist giving as few hints as possible, allowing pt to do her work as much as possible.   08/31/24: SLP discussed results of CLQT with pt and husband - pt's husband with questions about this. When asked, pt stated she was not at baseline and, specifically, I still have trouble remembering things - but that was going on before. SLP reviewed with pt that we are concerned about changes after CVA. Husband stated he thinks pt is at approx 80% of baseline. SLP had pt check her homework as she skipped 3 entries on one handout and did not correct her errors on the other (code from the last letters of the word). Pt req'd assistance with alternating attention with both tasks as she lost her place, and found it again 50% of the time (incr'd to 100% with mod A from SLP).  08/28/24: CLQT today. Even though pt scored WNL, SLP has noted some s/sx of mild attention deficits, which  husband suspects is different from premorbid status. Pt had difficulty with a simple task targeting alternating attention, which is parallel with Steve's comment in s. When pt was asked she did not explain away deficits but did not endorse either. SLP suspects this could be pt's personality as she playfully stated self-deprecating or self-critical comments during the evaluation such as Well I guess I'm not as smart as you two. SLP to keep goals for awareness until ensured pt's awareness is WFL/WNL. Pt and husband both endorse changes in pt's short term memory. SLP to cont to focus on attention and memory in ST. Pt agrees.    08/22/24: Pt needs CLQT and PROM next visit. Given pt's cont'd complaints of memory difficulties, SLP reviewed memory strategies with pt and husband. Pt mentioned specific difficulty remembering people's names. SLP and pt/husband collaborated on developing strategy/ies for memory taking AM meds. SLP educated pt and husband about tasks pt could do at home to work with pt's cognitive deficits. Homework for attention and details.   07/27/24: SLP discussed results of pt's HVLT, explained rationale for further assessment, provided memory strategies which will need to be reviewed at first visit, explained some home tasks, and provided information re: memory and attention and interplay between them.  PATIENT EDUCATION: Education details: see treatment date Person educated: Patient and Spouse Education method: Explanation, Demonstration, and Handouts Education comprehension: verbalized understanding, returned demonstration, and needs further education   GOALS: Goals reviewed with patient? No  SHORT TERM GOALS: Target date:  09/21/24 (due to visit number)  Pt will undergo further cognitive testing Baseline: Goal status: met  2.  Pt will utilize at least 2 memory strategies successfully, with cues to use them, between two sessions Baseline: 09/07/24 Goal status: INITIAL  3.  Pt  will demo knowledge of at least 3 deficit areas in order to provide appropriate compensations, in two sessions Baseline:  Goal status: INITIAL  4.  Pt  will demo attention skills appropriate for min complex cognitive linguistic task in min-mod noisy environment for 8 minutes Baseline:  Goal status: INITIAL   LONG TERM GOALS: Target date: 09/28/24  Pt will improve PROM Baseline:  Goal status: INITIAL  2.  Pt will demo attention skills appropriate for alternating between min complex cognitive linguistic tasks in min noisy environment for 10 minutes, in 2 sessions Baseline:  Goal status: INITIAL  3.  Pt will demo ability to perform 3 household tasks with mod I (compensations), between 3 sessions Baseline:  Goal status: INITIAL  4.  Pt will functionally solve practical problems during cognitive linguistic tasks in 3 ST sessions Baseline:  Goal status: INITIAL   ASSESSMENT:  CLINICAL IMPRESSION: Patient is a 79 y.o. F who was seen today for treatment of cognitive linguistic skills from a lt ICH suffered on 06/19/24. See treatment date above for today's date for further details on today's session. During the evaluation she demonstrated deficits in memory, attention, awareness, and problem solving.    OBJECTIVE IMPAIRMENTS: include attention, memory, awareness, and executive functioning. These impairments are limiting patient from managing medications, managing appointments, managing finances, household responsibilities, ADLs/IADLs, and effectively communicating at home and in community. Factors affecting potential to achieve goals and functional outcome are ability to learn/carryover information and severity of impairments.. Patient will benefit from skilled SLP services to address above impairments and improve overall function.  REHAB POTENTIAL: Good  PLAN:  SLP FREQUENCY: 2x/week  SLP DURATION: 8 weeks  PLANNED INTERVENTIONS: Environmental controls, Cueing hierachy, Cognitive  reorganization, Internal/external aids, Functional tasks, Multimodal communication approach, SLP instruction and feedback, Compensatory strategies, Patient/family education, and 07492 Treatment of speech (30 or 45 min)     Tyronne Blann, CCC-SLP 09/18/2024, 2:50 PM

## 2024-09-21 ENCOUNTER — Ambulatory Visit

## 2024-09-25 ENCOUNTER — Ambulatory Visit

## 2024-09-28 ENCOUNTER — Ambulatory Visit (INDEPENDENT_AMBULATORY_CARE_PROVIDER_SITE_OTHER): Admitting: Audiology

## 2024-09-28 ENCOUNTER — Encounter

## 2024-09-28 DIAGNOSIS — E7849 Other hyperlipidemia: Secondary | ICD-10-CM | POA: Diagnosis not present

## 2024-09-28 DIAGNOSIS — E559 Vitamin D deficiency, unspecified: Secondary | ICD-10-CM | POA: Diagnosis not present

## 2024-09-28 DIAGNOSIS — H903 Sensorineural hearing loss, bilateral: Secondary | ICD-10-CM

## 2024-09-28 DIAGNOSIS — Z1389 Encounter for screening for other disorder: Secondary | ICD-10-CM | POA: Diagnosis not present

## 2024-10-01 NOTE — Progress Notes (Signed)
  [address], [address] [place], [place] [address] [phone]  Audiological Evaluation    Name: Chelsea Barnett     DOB:   Sep 06, 1946      MRN:   [id number]                                                                                     Service Date: 10/01/2024     Accompanied by: family member   Patient comes today after Dr. Karis, ENT sent a referral for a hearing evaluation due to concerns with hearing loss.   Symptoms Yes Details  Hearing loss  [x]  Known hearing loss. Last audiogram was 08-04-2023. Patient wants to know if her hearing has changed  Tinnitus  []    Ear pain/ infections/pressure  []    Balance problems  []    Noise exposure history  []    Previous ear surgeries  []    Family history of hearing loss  []    Amplification  [x]  Patient has a set of [employer] hearing aids that were fit at Dr. Rojean office.  Other  []      Otoscopy: Right ear: Clear external ear canal and notable landmarks visualized on the tympanic membrane. Left ear:  Clear external ear canal and notable landmarks visualized on the tympanic membrane.  Tympanometry: Right ear: Type A- Normal external ear canal volume with normal middle ear pressure and tympanic membrane compliance. Left ear: Type A- Normal external ear canal volume with normal middle ear pressure and tympanic membrane compliance.   Pure tone Audiometry: Right ear- Normal hearing from 954 155 9221 Hz, then mild to moderately severe sensorineural hearing loss from 1500 Hz - 8000 Hz. Left ear-  Normal hearing from 125-750 Hz, then mild to moderately severe  sensorineural hearing loss from 1000 Hz - 8000 Hz.  Speech Audiometry: Right ear- Speech Reception Threshold (SRT) was obtained at 20 dBHL. Left ear-Speech Reception Threshold (SRT) was obtained at 30 dBHL.   Word Recognition Score Tested using NU-6 (recorded) Right ear: 100% was obtained at a presentation level of 75 dBHL with contralateral masking which is deemed as   excellent. Left ear: 100% was obtained at a presentation level of 75 dBHL with contralateral masking which is deemed as  excellent.   The hearing test results were completed under headphones and results are deemed to be of good reliability. Test technique:  conventional    Impression: Slight asymmetry noted from 1000-2000 Hz, worse in the left ear, continue so be observed,   Recommendations: Follow up with ENT as scheduled for today. Return for a hearing evaluation if concerns with hearing changes arise or per MD recommendation. Recommend hearing aid follow up, as needed  Avyonna Wagoner MARIE LEROUX-MARTINEZ, AUD

## 2024-10-02 ENCOUNTER — Ambulatory Visit: Attending: Student in an Organized Health Care Education/Training Program

## 2024-10-02 DIAGNOSIS — I69198 Other sequelae of nontraumatic intracerebral hemorrhage: Secondary | ICD-10-CM | POA: Diagnosis not present

## 2024-10-02 DIAGNOSIS — R2689 Other abnormalities of gait and mobility: Secondary | ICD-10-CM | POA: Diagnosis not present

## 2024-10-02 DIAGNOSIS — R531 Weakness: Secondary | ICD-10-CM | POA: Diagnosis not present

## 2024-10-02 DIAGNOSIS — R41841 Cognitive communication deficit: Secondary | ICD-10-CM | POA: Insufficient documentation

## 2024-10-05 ENCOUNTER — Ambulatory Visit

## 2024-10-05 DIAGNOSIS — E785 Hyperlipidemia, unspecified: Secondary | ICD-10-CM | POA: Diagnosis not present

## 2024-10-05 DIAGNOSIS — R41841 Cognitive communication deficit: Secondary | ICD-10-CM

## 2024-10-05 DIAGNOSIS — R5382 Chronic fatigue, unspecified: Secondary | ICD-10-CM | POA: Diagnosis not present

## 2024-10-05 DIAGNOSIS — I10 Essential (primary) hypertension: Secondary | ICD-10-CM | POA: Diagnosis not present

## 2024-10-05 NOTE — Therapy (Addendum)
 OUTPATIENT SPEECH LANGUAGE PATHOLOGY TREATMENT   Patient Name: Chelsea Barnett MRN: [id number] DOB:15-Apr-1946, 78 y.o., female Today's Date: 10/05/2024  PCP: Loreli Fallow, MD REFERRING PROVIDER: Lenetta Dover, MD (ref), Doc sent to PCP  END OF SESSION:  End of Session - 10/05/24 1159     Visit Number 9    Number of Visits 17   Date for Recertification  11/23/24    SLP Start Time 1018    SLP Stop Time  1100    SLP Time Calculation (min) 42 min    Activity Tolerance Patient tolerated treatment well             Past Medical History:  Diagnosis Date   Basal cell cancer    right leg   Heart murmur    Hemorrhoids    Melanoma (HCC) 2005   on left arm   Migraines    with aura   Post-operative nausea and vomiting    after 2 surgeries   Psoriasis    Vaginal delivery    times 6   Past Surgical History:  Procedure Laterality Date   basal cell CA removal     BUNIONECTOMY Right    fallopian tube removal     laparoscopic bso- fibroma   melanoma removal     left upper arm   ovaries removed     laparoscopic bso- fibroma   REFRACTIVE SURGERY     Bil   TUBAL LIGATION  1984   WISDOM TOOTH EXTRACTION     Patient Active Problem List   Diagnosis Date Noted   Sensorineural hearing loss, bilateral 09/01/2024   Hemorrhagic stroke (HCC) 06/19/2024   Right arm weakness 06/28/2023   CVA (cerebral vascular accident) (HCC) 06/28/2023   Essential hypertension 06/28/2023   History of skin cancer 06/28/2023   Nontraumatic subarachnoid hemorrhage (HCC) 06/28/2023   Chest pain on breathing 11/19/2013   Heart murmur, systolic 08/29/2013   Pure hypercholesterolemia 06/01/2012   Edema 06/01/2012   Weight loss 06/01/2012   Psoriasis 06/01/2012  Speech Therapy Progress Note  Dates of Reporting Period: 07/27/24 to present  Subjective Statement: Pt has been seen for 9 visits ST in this course of tx targeting cognitive linguistics. Currently, pt and husband report memory is  pt's only lingering sx from her ICH in June 2025.  Objective: See below  Goal Update: See below  Plan: See pt x1-2/week for 4 more weeks  Reason Skilled Services are Required: Pt has not reached max potential.    ONSET DATE: 06/19/24   REFERRING DIAG: Lenetta Dover, MD  THERAPY DIAG:  Cognitive communication deficit  Rationale for Evaluation and Treatment: Rehabilitation  SUBJECTIVE:   SUBJECTIVE STATEMENT: Did I call to cancel the last appointment?  Pt accompanied by: significant other husband [name]  PERTINENT HISTORY: Arrived at ED 6/24 with R sided weakness. CTH showed acute parenchymal hemorrhage within the anterior medial L frontal lobe along with an acute SDH along the L aspect of the falx. PMH includes TIA (deemed to be amyloid spells after further neuro w/u), prior CVA, cerebral hemorrhage possibly amyloid angiopathy   PAIN:  Are you having pain? No  FALLS: Has patient fallen in last 6 months?  Number of falls: two (with CVA, and one in IPR (per documentation))   PATIENT GOALS: Improve cognition  OBJECTIVE:  Note: Objective measures were completed at Evaluation unless otherwise noted.  DIAGNOSTIC FINDINGS:  SLE 06/19/24 Pt reports being independent with all tasks at baseline. She scored WFL on all  subsections of the Cognistat except delayed recall and calculations, both of which she reports having difficulty with PTA. Discussed at least brief SLP f/u to target functional task of medication management, with which pt and her daughter are agreeable. Will continue following.  MRI 06/19/24 IMPRESSION: 1. Left frontal lobe cerebral hematoma without significant interval change. No evidence of underlying mass or vascular malformation. 2. Stable left parafalcine subdural hematoma. 3. Chronic hemosiderin staining within the left precentral sulcus from remote subarachnoid hemorrhage.   STANDARDIZED ASSESSMENTS: Hopkins Verbal Learning Test - RECALL: 4/12 (below  WNL), 6/12 (below WNL), 7/12 (below WNL) RECOGNITION: 9 (>3 standard dev below mean). These scores indicate there may well be an attention component to pt's memory disorder and/or that she has difficulty encoding memories at this time.   Cognitive Linguistic Quick Test  AGE - 70-89  The Cognitive Linguistic Quick Test (CLQT) was administered to assess the relative status of five cognitive domains: attention, memory, language, executive functioning, and visuospatial skills. Scores from 10 tasks were used to estimate severity ratings (standardized for age groups 18-69 years and 70-89 years) for each domain, a clock drawing task, as well as an overall composite severity rating of cognition.      Task Score Criterion Cut Scores  Personal Facts 8/8 8  Symbol Cancellation 12/12 10  Confrontation Naming 10/10 10  Clock Drawing  11/13 11  Story Retelling 6/10 5  Symbol Trails 10/10 6  Generative Naming 6/9 4  Design Memory 5/6 4  Mazes  7/8 4  Design Generation 8/13 5    Cognitive Domain Composite Score Severity Rating  Attention 196/215 WNL  Memory 148/185 WNL  Executive Function 31/40 WNL  Language 32/37 WNL  Visuospatial Skills 93/105 WNL  Clock Drawing  11/13 WNL  Composite Severity Rating  WNL      PATIENT REPORTED OUTCOME MEASURES (PROM): Cognitive Function: pt returned today with totaled 81/130 with lower scores indicating less of an impact on pt's daily life/QoL.                                                                                                                             TREATMENT DATE:   10/05/24: Pt and husband report to SLP that pt is primarily at baseline and that memory is really pt's only deficit area remaining. SLP observed husband providing appropriate cues for pt's memory during session today and encouraged husband to continue with this. Pt uses calendar appropriately at home. SLP reviewed memory strategies with pt today - printed memory strategies for pt  again. Pt also told SLP that she started writing a journal with some things but now cannot find the journal. SLP suggested putting the journal in ONE place and only one place. Pt, husband, and SLP agreed pt could decr to once/week.  09/18/24: SLP assisted pt in checking her homework. Pt was 88% accurate with her responses.- three errors remained. Pt req'd mod-max A usually to correct. SLP highly  encouraged pt to cook something she knows (Christmas toffee, and granola) for homework for attention and details. [name] to assist with toffee, and CNA to assist with granola. SLP told [name] and pt that CNA should not TELL [name] what she 's done wrong just alert her she's done something wrong to see if she can correct spontaneously without any more cues. [name] still asks [name] multiple times (three today) when the appointments are instead of spontaneously using her calendar.   09/11/24: SLP assisted pt with homework corrections. Pt success 85% overall, but when she double checked her errors her success improved to 95%. She was aware of 4/6 errors. Extra time consistently necessary for details and problem solving. In an alternating attention min-mod detailed task she req'd extra time consistently, and occasional min A for attention to detail. Homework provided.   09/07/24: Pt with 87% success initially with detailed written instructions. Awareness 77% of the time of errors. Pt req'd cues for double checking work. SLP and pt and husband talked for a while about games and tasks pt can practice at home that would be beneficial for cognition as husbnad is going away for a week and friends will be checking in on [name] regularly in the afternoons (attendant there in the mornings). Pt is using calendar regularly and notes at home. Has to go back to the calendar regularly and does not necessarily recall information there.   09/05/24: SLP guided pt through checking her homework. Multiple errors, approx awareness 60%. Pt with evidence of  reduced sustained and selective (internal distraction) attention hindering accuracy, awareness, and problem solving. SLP provided one homework task a second time and asked [name] to assist giving as few hints as possible, allowing pt to do her work as much as possible.   08/31/24: SLP discussed results of CLQT with pt and husband - pt's husband with questions about this. When asked, pt stated she was not at baseline and, specifically, I still have trouble remembering things - but that was going on before. SLP reviewed with pt that we are concerned about changes after CVA. Husband stated he thinks pt is at approx 80% of baseline. SLP had pt check her homework as she skipped 3 entries on one handout and did not correct her errors on the other (code from the last letters of the word). Pt req'd assistance with alternating attention with both tasks as she lost her place, and found it again 50% of the time (incr'd to 100% with mod A from SLP).  08/28/24: CLQT today. Even though pt scored WNL, SLP has noted some s/sx of mild attention deficits, which husband suspects is different from premorbid status. Pt had difficulty with a simple task targeting alternating attention, which is parallel with Steve's comment in s. When pt was asked she did not explain away deficits but did not endorse either. SLP suspects this could be pt's personality as she playfully stated self-deprecating or self-critical comments during the evaluation such as Well I guess I'm not as smart as you two. SLP to keep goals for awareness until ensured pt's awareness is WFL/WNL. Pt and husband both endorse changes in pt's short term memory. SLP to cont to focus on attention and memory in ST. Pt agrees.    08/22/24: Pt needs CLQT and PROM next visit. Given pt's cont'd complaints of memory difficulties, SLP reviewed memory strategies with pt and husband. Pt mentioned specific difficulty remembering people's names. SLP and pt/husband collaborated on  developing strategy/ies for memory taking AM  meds. SLP educated pt and husband about tasks pt could do at home to work with pt's cognitive deficits. Homework for attention and details.   07/27/24: SLP discussed results of pt's HVLT, explained rationale for further assessment, provided memory strategies which will need to be reviewed at first visit, explained some home tasks, and provided information re: memory and attention and interplay between them.  PATIENT EDUCATION: Education details: see treatment date Person educated: Patient and Spouse Education method: Explanation, Demonstration, and Handouts Education comprehension: verbalized understanding, returned demonstration, and needs further education   GOALS: Goals reviewed with patient? No  SHORT TERM GOALS: Target date:  09/21/24 (due to visit number)  Pt will undergo further cognitive testing Baseline: Goal status: met  2.  Pt will utilize at least 2 memory strategies successfully, with cues to use them, between two sessions Baseline: 09/07/24 Goal status: met  3.  Pt will demo knowledge of at least 3 deficit areas in order to provide appropriate compensations, in two sessions Baseline:  Goal status: deferred (memory only)  4.  Pt will demo attention skills appropriate for min complex cognitive linguistic task in min-mod noisy environment for 8 minutes Baseline:  Goal status: met (per report)   LONG TERM GOALS: Target date: 09/28/24  Pt will improve PROM Baseline:  Goal status: INITIAL  2.  Pt will demo attention skills appropriate for alternating between min complex cognitive linguistic tasks in min noisy environment for 10 minutes, in 2 sessions Baseline:  Goal status: deferred - pt attention skills are at baseline  3.  Pt will demo ability to perform 3 household tasks with mod I (compensations), between 3 sessions Baseline: 10/05/24 Goal status: INITIAL  4.  Pt will functionally solve practical problems during  cognitive linguistic tasks in 3 ST sessions Baseline:  Goal status: deferred - pt problem solving skills are at baseline   ASSESSMENT:  CLINICAL IMPRESSION: RECERT TODAY. Patient is a 78 y.o. F who was seen today for treatment of cognitive linguistic skills from a lt ICH suffered on 06/19/24. See treatment date above for today's date for further details on today's session. Pt and husband agree pt's skills are primarily back to baseline, except memory. During the evaluation she demonstrated deficits in memory, attention, awareness, and problem solving.    OBJECTIVE IMPAIRMENTS: include attention, memory, awareness, and executive functioning. These impairments are limiting patient from managing medications, managing appointments, managing finances, household responsibilities, ADLs/IADLs, and effectively communicating at home and in community. Factors affecting potential to achieve goals and functional outcome are ability to learn/carryover information and severity of impairments.. Patient will benefit from skilled SLP services to address above impairments and improve overall function.  REHAB POTENTIAL: Good  PLAN:  SLP FREQUENCY: 1-2x/week  SLP DURATION: 4 weeks additional  PLANNED INTERVENTIONS: Environmental controls, Cueing hierachy, Cognitive reorganization, Internal/external aids, Functional tasks, Multimodal communication approach, SLP instruction and feedback, Compensatory strategies, Patient/family education, and [id number] Treatment of speech (30 or 45 min)     Nariah Morgano, CCC-SLP 10/05/2024, 12:07 PM

## 2024-10-05 NOTE — Patient Instructions (Signed)

## 2024-10-09 ENCOUNTER — Ambulatory Visit

## 2024-10-10 ENCOUNTER — Encounter: Payer: Self-pay | Admitting: Neurology

## 2024-10-10 ENCOUNTER — Ambulatory Visit: Admitting: Neurology

## 2024-10-10 VITALS — BP 128/80 | HR 81 | Ht 63.0 in | Wt 129.2 lb

## 2024-10-10 DIAGNOSIS — E854 Organ-limited amyloidosis: Secondary | ICD-10-CM | POA: Diagnosis not present

## 2024-10-10 DIAGNOSIS — R413 Other amnesia: Secondary | ICD-10-CM | POA: Insufficient documentation

## 2024-10-10 DIAGNOSIS — E785 Hyperlipidemia, unspecified: Secondary | ICD-10-CM | POA: Insufficient documentation

## 2024-10-10 DIAGNOSIS — F411 Generalized anxiety disorder: Secondary | ICD-10-CM | POA: Insufficient documentation

## 2024-10-10 DIAGNOSIS — H9313 Tinnitus, bilateral: Secondary | ICD-10-CM | POA: Insufficient documentation

## 2024-10-10 DIAGNOSIS — G9332 Myalgic encephalomyelitis/chronic fatigue syndrome: Secondary | ICD-10-CM | POA: Insufficient documentation

## 2024-10-10 DIAGNOSIS — I700 Atherosclerosis of aorta: Secondary | ICD-10-CM | POA: Insufficient documentation

## 2024-10-10 DIAGNOSIS — Z8673 Personal history of transient ischemic attack (TIA), and cerebral infarction without residual deficits: Secondary | ICD-10-CM | POA: Insufficient documentation

## 2024-10-10 DIAGNOSIS — G3184 Mild cognitive impairment, so stated: Secondary | ICD-10-CM

## 2024-10-10 DIAGNOSIS — G4700 Insomnia, unspecified: Secondary | ICD-10-CM | POA: Insufficient documentation

## 2024-10-10 DIAGNOSIS — Z8679 Personal history of other diseases of the circulatory system: Secondary | ICD-10-CM | POA: Insufficient documentation

## 2024-10-10 NOTE — Patient Instructions (Signed)
 I had a long discussion with patient and her husband regarding her diet intracerebral hemorrhages, memory loss and mild cognitive impairment and risk for progression to dementia and answered questions.  I recommend she increase participation in cognitively challenging activities like solving crossword puzzles, playing bridge and sudoku.  We discussed memory compensation strategies.  Recommend strict control of hypertension with blood pressure goal below 130/90.  She was advised to avoid strong antiplatelet agents and anticoagulants due to increased risk for intracerebral hemorrhage.  Return for follow-up in the future in 1 year or call earlier if necessary.  Memory Compensation Strategies  Use WARM strategy.  W= write it down  A= associate it  R= repeat it  M= make a mental note  2.   You can keep a Glass blower/designer.  Use a 3-ring notebook with sections for the following: calendar, important names and phone numbers,  medications, doctors' names/phone numbers, lists/reminders, and a section to journal what you did  each day.   3.    Use a calendar to write appointments down.  4.    Write yourself a schedule for the day.  This can be placed on the calendar or in a separate section of the Memory Notebook.  Keeping a  regular schedule can help memory.  5.    Use medication organizer with sections for each day or morning/evening pills.  You may need help loading it  6.    Keep a basket, or pegboard by the door.  Place items that you need to take out with you in the basket or on the pegboard.  You may also want to  include a message board for reminders.  7.    Use sticky notes.  Place sticky notes with reminders in a place where the task is performed.  For example:  turn off the  stove placed by the stove, lock the door placed on the door at eye level,  take your medications on  the bathroom mirror or by the place where you normally take your medications.  8.    Use alarms/timers.  Use  while cooking to remind yourself to check on food or as a reminder to take your medicine, or as a  reminder to make a call, or as a reminder to perform another task, etc.

## 2024-10-10 NOTE — Progress Notes (Signed)
 [facility] [address] [place]. [place] [address] [phone]       OFFICE FOLLOW-UP NOTE  [name] Date of Birth:  04-09-1946 Medical Record Number:  [id number]    Primary neurologist: Dr. Rosemarie Reason for visit: Cognitive complaints, recent SDH    Chief Complaint  Patient presents with   RM17/STROKE/MEMORY    Pt is here with her Husband. Pt states she has balance issues. Pt states she has memory issues.       HPI:  Update 10/10/2024.  Patient returns for follow-up after last visit with Chelsea Barnett nurse practitioner 2 months ago.  She is accompanied by her husband.  They are concerned about her short-term memory and cognitive difficulties but upon inquiry they feel it is not progressive.  She is currently doing outpatient speech physical and Occupational Therapy and progressing very well.  She has trouble remembering recent information but long-term memory seems fine.  She is still mostly independent in most activities of daily living.  She denies any delusions hallucinations, unsafe behavior or agitation.  She had MRI scan of the brain on 08/21/2024 which shows expected evolution and decrease in size of left frontal parenchymal hemorrhage.  No new or worrisome findings.  Blood pressure is well-controlled on today it is 128/80.  She is not on any blood thinners.  On cognitive testing today she did quite well recall 3/3 and able to name 13 animals who can walk on  4 legs and clock drawing was 4/4. Update 08/16/2024 Chelsea Barnett: Patient returns for sooner scheduled visit for hospital follow-up.  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  Aspirin  held.  CTV no venous thrombosis.  CTA head/neck negative LVO.  Serial CT scans initially showed increasing size but on day 3, scan showed stable to slightly decreased hemorrhage.  Advised to continue  to hold aspirin  until follow-up visit.  She remained on Crestor  for HLD management.  Resumed home antihypertensive regimen.  Therapies recommended CIR, discharged to [facility] on 6/29.   Husband contacted office after discharge to discuss repeat MRI brain imaging as recommended at hospital discharge.  This is currently scheduled on 8/26. Reports worsening cognition since discharge, denies any noticeable improvement but denies any worsening. Having greater difficulty with doing things such as using a computer or being able to follow instruction/directions such as with cooking.  She was evaluated by SLP 8/1 for cognitive difficulties and has follow-up visit scheduled next week. MMSE today 29/30 (prior 30/30), MOCA 25/30 (prior 19/30).  She has also been working with PT through the [facility], husband notes making really good progress in regards to right-sided weakness and balance.  She was initially using rolling walker at all times but now only using as needed.  Denies any recent falls.  Routinely follows with PCP and cardiology for stroke risk factor management.  Cardiology recently restarted losartan  and blood pressure has been stable.  She has remained off aspirin .  She remains on Crestor .  No further questions or concerns at this time.     History provided for reference purposes only Update 04/11/2024 Dr. Rosemarie: She returns for follow-up after last visit 6 months ago.  She is accompanied by her husband.  They feel the patient fatigues a lot since her stroke in January 2025.  She has poor appetite and she had trouble sleeping.  She she has been  evaluated by primary care physician, cardiology and OB/GYN urologist and psychiatrist.  She has been sleeping well since being started on trazodone.  She is also noted subjective decline in her memory.  Patient and husband feel that this may be related to Crestor  which she is started since her stroke.  Last lipid profile on 09/26/2023 showed LDL-cholesterol to be  quite optimal at 37 mg percent.  She is presently on Crestor  10 mg daily and would like to discontinue or reduce the dose.  She is tolerating aspirin  well without bruising or bleeding.  Her blood pressure is well-controlled.  She has had no recurrent stroke or TIA symptoms.  On cognitive testing today she did poorly on the MoCA and scored 19/30 but did better on the Mini-Mental but she scored 30/30.  Mild testing she had 0/3 recall but did better on Mini-Mental.   Consult visit 04/11/2024 Dr. Rosemarie: [name] is a 78 year old pleasant Caucasian lady seen today for initial office follow-up visit following hospital consultation for TIA in July 2024.  She is accompanied by her husband.  History is obtained from them and review of electronic medical records.  I personally reviewed pertinent available imaging films in PACS.  She has past medical history of migraines with aura, psoriasis, skin cancer who presented on 06/28/2023 with 2 transient episodes of right upper extremity numbness lasting only 1 to 2 minutes.  She denied any weakness though stated her right arm felt heavy.  This does not involve her face or leg.  She had no accompanying headache or any visual disturbance before during or after these episodes.  She had an MRI scan of the brain which showed a small volume acute subarachnoid hemorrhage involving left frontal and parietal lobes and 3 mm acute cortical infarct in the left MCA territory.  There is also questionable 4 mm acute cortical infarct in posterior left frontal lobe versus artifact from adjacent subarachnoid hemorrhage.  There are mild changes of chronic small vessel disease.  There were few areas of punctate chronic microhemorrhages noted in the supratentorial cortex raising concern for possible amyloid angiopathy versus hypertensive small vessel disease.  CT angiogram of the brain and neck showed no large vessel stenosis or occlusion.  There is mild aortic atherosclerosis noted.   Echocardiogram showed ejection fraction of 60 to 65% left atrium size is normal.  LDL cholesterol 130 mg percent.  Hemoglobin A1c was 5.4.  EEG was normal without seizure activity.  Patient subsequently underwent 30-day external heart monitor which did not show evidence of paroxysmal A-fib.  Patient was started on aspirin  alone given her microhemorrhages and concern for amyloid angiopathy.  She is tolerating it well with minor bruising and no bleeding.  She states she has had no further episodes of numbness.  She has had no other stroke or TIA symptoms.  She has remote history of migraines but states she has not had them for years.  Her blood pressure is under good control.  She is tolerating Crestor  well without muscle aches and pains until she started taking co-Q10.  Last lipid profile on 09/14/2023 showed LDL cholesterol to have improved to 45 mg percent.  CMP, CBC, vitamin D  and TSH were all normal.  The patient on inquiry admits to mild short-term memory difficulties which she had even before her TIA episodes in appears not to have gotten any worse.  She has trouble remembering names at times but remember them later.  She is still independent in all activities of daily  living and managing her own affairs.  She has no new complaints.    ROS:   14 system review of systems is positive for those listed in HPI and all other systems negative    PMH:  Past Medical History:  Diagnosis Date   Basal cell cancer    right leg   Heart murmur    Hemorrhoids    Melanoma (HCC) 2005   on left arm   Migraines    with aura   Post-operative nausea and vomiting    after 2 surgeries   Psoriasis    Vaginal delivery    times 6    Social History:  Social History   Socioeconomic History   Marital status: Married    Spouse name: Not on file   Number of children: Not on file   Years of education: Not on file   Highest education level: Not on file  Occupational History   Not on file  Tobacco Use    Smoking status: Never   Smokeless tobacco: Never  Vaping Use   Vaping status: Never Used  Substance and Sexual Activity   Alcohol  use: Not Currently    Alcohol /week: 0.0 standard drinks of alcohol    Drug use: No   Sexual activity: Not Currently    Partners: Male    Birth control/protection: Surgical    Comment: BTL, BSO  Other Topics Concern   Not on file  Social History Narrative   Not on file   Social Drivers of Health   Financial Resource Strain: Not on file  Food Insecurity: No Food Insecurity (06/19/2024)   Hunger Vital Sign    Worried About Running Out of Food in the Last Year: Never true    Ran Out of Food in the Last Year: Never true  Transportation Needs: No Transportation Needs (06/19/2024)   PRAPARE - Administrator, Civil Service (Medical): No    Lack of Transportation (Non-Medical): No  Physical Activity: Not on file  Stress: Not on file  Social Connections: Moderately Integrated (06/19/2024)   Social Connection and Isolation Panel    Frequency of Communication with Friends and Family: More than three times a week    Frequency of Social Gatherings with Friends and Family: More than three times a week    Attends Religious Services: Never    Database administrator or Organizations: Yes    Attends Engineer, structural: More than 4 times per year    Marital Status: Married  Catering manager Violence: Not At Risk (06/19/2024)   Humiliation, Afraid, Rape, and Kick questionnaire    Fear of Current or Ex-Partner: No    Emotionally Abused: No    Physically Abused: No    Sexually Abused: No    Medications:   Current Outpatient Medications on File Prior to Visit  Medication Sig Dispense Refill   acetaminophen  (TYLENOL ) 325 MG tablet Take 650 mg by mouth daily as needed for headache, mild pain (pain score 1-3) or moderate pain (pain score 4-6).     Apremilast  (OTEZLA ) 30 MG TABS Take 15 mg in the morning and 30 mg at night. (Patient taking  differently: Take 15 mg by mouth in the morning and at bedtime. Take 15 mg in the morning and 30 mg at night.)     losartan  (COZAAR ) 50 MG tablet TAKE ONE TABLET IN THE MORNING AND TAKE 1/2 TABLET EVERY EVENING 45 tablet 3   mirtazapine  (REMERON ) 30 MG tablet Take 30 mg  by mouth at bedtime. (Patient taking differently: Take 15 mg by mouth at bedtime.)     rosuvastatin  (CRESTOR ) 10 MG tablet Take 0.5 tablets (5 mg total) by mouth daily. 45 tablet 3   triamcinolone cream (KENALOG) 0.1 % Apply 1 Application topically as needed (psoriasis).     Vitamin D , Ergocalciferol , (DRISDOL ) 50000 units CAPS capsule Take 50,000 Units by mouth every 7 (seven) days.     [Paused] aspirin  81 MG chewable tablet Chew 1 tablet (81 mg total) by mouth daily. 90 tablet 0   [Paused] metoprolol  succinate (TOPROL  XL) 25 MG 24 hr tablet Take 1 tablet (25 mg total) by mouth daily. (Patient not taking: Reported on 10/10/2024) 90 tablet 3   No current facility-administered medications on file prior to visit.    Allergies:  No Known Allergies  Physical Exam Today's Vitals   10/10/24 1546  BP: 128/80  Pulse: 81  SpO2: 99%  Weight: 129 lb 3.2 oz (58.6 kg)  Height: 5' 3 (1.6 m)   Body mass index is 22.89 kg/m.  General: well developed, well nourished, very pleasant elderly Caucasian female, seated, in no evident distress  Neurologic Exam Mental Status: Awake and fully alert. Oriented to place and time. Recent memory impaired and remote memory intact. Attention span, concentration and fund of knowledge mildly impaired, relies on husband to answer questions. Mood and affect appropriate.  Clock drawing 4/4.  Able to name 13 animals which can walk on 4 legs.  Cranial Nerves: Pupils equal, briskly reactive to light. Extraocular movements full without nystagmus. Visual fields full to confrontation. Hearing intact. Facial sensation intact. Face, tongue, palate moves normally and symmetrically.  Motor: Normal bulk and tone.  Normal strength in all tested extremity muscles except mild right hand weakness Sensory.: intact to touch ,pinprick .position and vibratory sensation.  Coordination: Rapid alternating movements normal in all extremities except slightly decreased right hand. Finger-to-nose and heel-to-shin performed accurately bilaterally. Gait and Station: Arises from chair without difficulty. Stance is normal. Gait demonstrates slightly decreased stride length and step height bilaterally without use of AD.  Tandem walk and heel toe not attempted Reflexes: 1+ and symmetric. Toes downgoing.        08/16/2024   10:10 AM 04/11/2024    1:57 PM 09/26/2023    9:44 AM  Montreal Cognitive Assessment   Visuospatial/ Executive (0/5) 5 2 5   Naming (0/3) 3 3 2   Attention: Read list of digits (0/2) 2 2 2   Attention: Read list of letters (0/1) 1 1 1   Attention: Serial 7 subtraction starting at 100 (0/3) 3 1 3   Language: Repeat phrase (0/2) 1 1 1   Language : Fluency (0/1) 1 1 1   Abstraction (0/2) 2 2 2   Delayed Recall (0/5) 1 0 2  Orientation (0/6) 6 6 6   Total 25 19 25         08/16/2024    9:30 AM 04/11/2024    2:36 PM  MMSE - Mini Mental State Exam  Orientation to time 5 5  Orientation to Place 5 5  Registration 3 3  Attention/ Calculation 5 5  Recall 3 3  Language- name 2 objects 2 2  Language- repeat 1 1  Language- follow 3 step command 3 3  Language- read & follow direction 1 1  Write a sentence 1 1  Copy design 0 1  Total score 29 30       ASSESSMENT: 78 year old Caucasian lady with recent left frontal lobe IPH with falcine SDH in  05/2024 with suspected etiology due to CAA.  Prior 2 transient episodes in 06/2023 of right upper extremity numbness lasting a few minutes possibly amyloid spells rather than TIAs from small vessel disease given abnormal MRI scan showing left frontal subarachnoid hemorrhage and microhemorrhages.  She also has mild age-appropriate cognitive impairment with may be at risk for  progression to dementia.     PLAN:   Left frontal SDH CAA Mild cognitive impairment  I had a long discussion with patient and her husband regarding her diet intracerebral hemorrhages, memory loss and mild cognitive impairment and risk for progression to dementia and answered questions.  I recommend she increase participation in cognitively challenging activities like solving crossword puzzles, playing bridge and sudoku.  We discussed memory compensation strategies.  Recommend strict control of hypertension with blood pressure goal below 130/90.  She was advised to avoid strong antiplatelet agents and anticoagulants due to increased risk for intracerebral hemorrhage.  Return for follow-up in the future in 1 year or call earlier if necessary.   I personally spent a total of 50 minutes in the care of the patient today including getting/reviewing separately obtained history, performing a medically appropriate exam/evaluation, counseling and educating, placing orders, referring and communicating with other health care professionals, documenting clinical information in the EHR, independently interpreting results, and coordinating care.        Eather Popp, MD  [facility] [address] [place], [place] [address]  Phone [phone] Fax [phone] Note: This document was prepared with digital dictation and possible smart phrase technology. Any transcriptional errors that result from this process are unintentional.

## 2024-10-12 ENCOUNTER — Ambulatory Visit

## 2024-10-12 DIAGNOSIS — G3184 Mild cognitive impairment, so stated: Secondary | ICD-10-CM | POA: Diagnosis not present

## 2024-10-12 DIAGNOSIS — D849 Immunodeficiency, unspecified: Secondary | ICD-10-CM | POA: Diagnosis not present

## 2024-10-12 DIAGNOSIS — Z23 Encounter for immunization: Secondary | ICD-10-CM | POA: Diagnosis not present

## 2024-10-12 DIAGNOSIS — R41841 Cognitive communication deficit: Secondary | ICD-10-CM

## 2024-10-12 DIAGNOSIS — E785 Hyperlipidemia, unspecified: Secondary | ICD-10-CM | POA: Diagnosis not present

## 2024-10-12 DIAGNOSIS — I493 Ventricular premature depolarization: Secondary | ICD-10-CM | POA: Diagnosis not present

## 2024-10-12 DIAGNOSIS — Z1339 Encounter for screening examination for other mental health and behavioral disorders: Secondary | ICD-10-CM | POA: Diagnosis not present

## 2024-10-12 DIAGNOSIS — M8580 Other specified disorders of bone density and structure, unspecified site: Secondary | ICD-10-CM | POA: Diagnosis not present

## 2024-10-12 DIAGNOSIS — I680 Cerebral amyloid angiopathy: Secondary | ICD-10-CM | POA: Diagnosis not present

## 2024-10-12 DIAGNOSIS — L409 Psoriasis, unspecified: Secondary | ICD-10-CM | POA: Diagnosis not present

## 2024-10-12 DIAGNOSIS — Z8679 Personal history of other diseases of the circulatory system: Secondary | ICD-10-CM | POA: Diagnosis not present

## 2024-10-12 DIAGNOSIS — I700 Atherosclerosis of aorta: Secondary | ICD-10-CM | POA: Diagnosis not present

## 2024-10-12 DIAGNOSIS — F411 Generalized anxiety disorder: Secondary | ICD-10-CM | POA: Diagnosis not present

## 2024-10-12 DIAGNOSIS — Z8673 Personal history of transient ischemic attack (TIA), and cerebral infarction without residual deficits: Secondary | ICD-10-CM | POA: Diagnosis not present

## 2024-10-12 DIAGNOSIS — Z0000 Encounter for general adult medical examination without abnormal findings: Secondary | ICD-10-CM | POA: Diagnosis not present

## 2024-10-12 DIAGNOSIS — Z1331 Encounter for screening for depression: Secondary | ICD-10-CM | POA: Diagnosis not present

## 2024-10-12 DIAGNOSIS — I10 Essential (primary) hypertension: Secondary | ICD-10-CM | POA: Diagnosis not present

## 2024-10-12 DIAGNOSIS — R82998 Other abnormal findings in urine: Secondary | ICD-10-CM | POA: Diagnosis not present

## 2024-10-12 NOTE — Therapy (Signed)
 OUTPATIENT SPEECH LANGUAGE PATHOLOGY TREATMENT   Patient Name: Chelsea Barnett MRN: [id number] DOB:07/24/1946, 78 y.o., female Today's Date: 10/12/2024  PCP: Loreli Fallow, MD REFERRING PROVIDER: Lenetta Dover, MD (ref), Doc sent to PCP  END OF SESSION:  End of Session - 10/12/24 1025     Visit Number 10    Number of Visits 14    Date for Recertification  11/09/24    SLP Start Time 1022   checked in 1021   SLP Stop Time  1100    SLP Time Calculation (min) 38 min    Activity Tolerance Patient tolerated treatment well             Past Medical History:  Diagnosis Date   Basal cell cancer    right leg   Heart murmur    Hemorrhoids    Melanoma (HCC) 2005   on left arm   Migraines    with aura   Post-operative nausea and vomiting    after 2 surgeries   Psoriasis    Vaginal delivery    times 6   Past Surgical History:  Procedure Laterality Date   basal cell CA removal     BUNIONECTOMY Right    fallopian tube removal     laparoscopic bso- fibroma   melanoma removal     left upper arm   ovaries removed     laparoscopic bso- fibroma   REFRACTIVE SURGERY     Bil   TUBAL LIGATION  1984   WISDOM TOOTH EXTRACTION     Patient Active Problem List   Diagnosis Date Noted   Chronic fatigue syndrome 10/10/2024   Generalized anxiety disorder 10/10/2024   Hardening of the aorta (main artery of the heart) 10/10/2024   History of cerebrovascular accident 10/10/2024   History of subarachnoid hemorrhage 10/10/2024   Hyperlipidemia 10/10/2024   Insomnia 10/10/2024   Memory loss 10/10/2024   Tinnitus of both ears 10/10/2024   Sensorineural hearing loss, bilateral 09/01/2024   Hemorrhagic stroke (HCC) 06/19/2024   Lethargy 02/15/2024   Multiple premature ventricular complexes 02/15/2024   Right arm weakness 06/28/2023   CVA (cerebral vascular accident) (HCC) 06/28/2023   Essential hypertension 06/28/2023   History of skin cancer 06/28/2023   Nontraumatic  subarachnoid hemorrhage (HCC) 06/28/2023   Rectocele 06/06/2020   Midline cystocele 01/16/2020   Uterovaginal prolapse, incomplete 01/16/2020   Chest pain on breathing 11/19/2013   Heart murmur, systolic 08/29/2013   Pure hypercholesterolemia 06/01/2012   Edema 06/01/2012   Weight loss 06/01/2012   Psoriasis 06/01/2012     ONSET DATE: 06/19/24   REFERRING DIAG: Lenetta Dover, MD  THERAPY DIAG:  Cognitive communication deficit  Rationale for Evaluation and Treatment: Rehabilitation  SUBJECTIVE:   SUBJECTIVE STATEMENT: He gave me a good report, didn't he?  Pt accompanied by: significant other husband Chelsea Barnett  PERTINENT HISTORY: Arrived at ED 6/24 with R sided weakness. CTH showed acute parenchymal hemorrhage within the anterior medial L frontal lobe along with an acute SDH along the L aspect of the falx. PMH includes TIA (deemed to be amyloid spells after further neuro w/u), prior CVA, cerebral hemorrhage possibly amyloid angiopathy   PAIN:  Are you having pain? No  FALLS: Has patient fallen in last 6 months?  Number of falls: two (with CVA, and one in IPR (per documentation))   PATIENT GOALS: Improve cognition  OBJECTIVE:  Note: Objective measures were completed at Evaluation unless otherwise noted.  DIAGNOSTIC FINDINGS:  SLE 06/19/24 Pt reports being  independent with all tasks at baseline. She scored WFL on all subsections of the Cognistat except delayed recall and calculations, both of which she reports having difficulty with PTA. Discussed at least brief SLP f/u to target functional task of medication management, with which pt and her daughter are agreeable. Will continue following.  MRI 06/19/24 IMPRESSION: 1. Left frontal lobe cerebral hematoma without significant interval change. No evidence of underlying mass or vascular malformation. 2. Stable left parafalcine subdural hematoma. 3. Chronic hemosiderin staining within the left precentral sulcus from remote  subarachnoid hemorrhage.   STANDARDIZED ASSESSMENTS: Hopkins Verbal Learning Test - RECALL: 4/12 (below WNL), 6/12 (below WNL), 7/12 (below WNL) RECOGNITION: 9 (>3 standard dev below mean). These scores indicate there may well be an attention component to pt's memory disorder and/or that she has difficulty encoding memories at this time.   Cognitive Linguistic Quick Test  AGE - 70-89  The Cognitive Linguistic Quick Test (CLQT) was administered to assess the relative status of five cognitive domains: attention, memory, language, executive functioning, and visuospatial skills. Scores from 10 tasks were used to estimate severity ratings (standardized for age groups 18-69 years and 70-89 years) for each domain, a clock drawing task, as well as an overall composite severity rating of cognition.      Task Score Criterion Cut Scores  Personal Facts 8/8 8  Symbol Cancellation 12/12 10  Confrontation Naming 10/10 10  Clock Drawing  11/13 11  Story Retelling 6/10 5  Symbol Trails 10/10 6  Generative Naming 6/9 4  Design Memory 5/6 4  Mazes  7/8 4  Design Generation 8/13 5    Cognitive Domain Composite Score Severity Rating  Attention 196/215 WNL  Memory 148/185 WNL  Executive Function 31/40 WNL  Language 32/37 WNL  Visuospatial Skills 93/105 WNL  Clock Drawing  11/13 WNL  Composite Severity Rating  WNL      PATIENT REPORTED OUTCOME MEASURES (PROM): Cognitive Function: pt returned today with totaled 81/130 with lower scores indicating less of an impact on pt's daily life/QoL.                                                                                                                             TREATMENT DATE:   10/12/24: Pt entered without specific recall of events at neurologist appointment two days ago. SLP inquired how pt could recall more details and she said bring her journal which SLP affirmed. SLP then had pt write down the points she wanted to share with her PCP after her ST  appointment today. She req'd usual min-mod A for recall of who she has seen (MD and specialists) and consistent min-mod A for details for each. SLP provided examples of critical thinking tasks she could perform at home for improving cognition. Pt appeared reticent to try these as she expressed skepticism she could complete SLP's suggestions.   10/05/24: Pt and husband report to SLP that pt is primarily at baseline and  that memory is really pt's only deficit area remaining. SLP observed husband providing appropriate cues for pt's memory during session today and encouraged husband to continue with this. Pt uses calendar appropriately at home. SLP reviewed memory strategies with pt today - printed memory strategies for pt again. Pt also told SLP that she started writing a journal with some things but now cannot find the journal. SLP suggested putting the journal in ONE place and only one place. Pt, husband, and SLP agreed pt could decr to once/week.  09/18/24: SLP assisted pt in checking her homework. Pt was 88% accurate with her responses.- three errors remained. Pt req'd mod-max A usually to correct. SLP highly encouraged pt to cook something she knows (Christmas toffee, and granola) for homework for attention and details. Chelsea Barnett to assist with toffee, and CNA to assist with granola. SLP told Chelsea Barnett and pt that CNA should not TELL Chelsea Barnett what she 's done wrong just alert her she's done something wrong to see if she can correct spontaneously without any more cues. Chelsea Barnett still asks Chelsea Barnett multiple times (three today) when the appointments are instead of spontaneously using her calendar.   09/11/24: SLP assisted pt with homework corrections. Pt success 85% overall, but when she double checked her errors her success improved to 95%. She was aware of 4/6 errors. Extra time consistently necessary for details and problem solving. In an alternating attention min-mod detailed task she req'd extra time consistently, and occasional  min A for attention to detail. Homework provided.   09/07/24: Pt with 87% success initially with detailed written instructions. Awareness 77% of the time of errors. Pt req'd cues for double checking work. SLP and pt and husband talked for a while about games and tasks pt can practice at home that would be beneficial for cognition as husbnad is going away for a week and friends will be checking in on Chelsea Barnett regularly in the afternoons (attendant there in the mornings). Pt is using calendar regularly and notes at home. Has to go back to the calendar regularly and does not necessarily recall information there.   09/05/24: SLP guided pt through checking her homework. Multiple errors, approx awareness 60%. Pt with evidence of reduced sustained and selective (internal distraction) attention hindering accuracy, awareness, and problem solving. SLP provided one homework task a second time and asked Chelsea Barnett to assist giving as few hints as possible, allowing pt to do her work as much as possible.   08/31/24: SLP discussed results of CLQT with pt and husband - pt's husband with questions about this. When asked, pt stated she was not at baseline and, specifically, I still have trouble remembering things - but that was going on before. SLP reviewed with pt that we are concerned about changes after CVA. Husband stated he thinks pt is at approx 80% of baseline. SLP had pt check her homework as she skipped 3 entries on one handout and did not correct her errors on the other (code from the last letters of the word). Pt req'd assistance with alternating attention with both tasks as she lost her place, and found it again 50% of the time (incr'd to 100% with mod A from SLP).  08/28/24: CLQT today. Even though pt scored WNL, SLP has noted some s/sx of mild attention deficits, which husband suspects is different from premorbid status. Pt had difficulty with a simple task targeting alternating attention, which is parallel with Steve's  comment in s. When pt was asked she did not explain  away deficits but did not endorse either. SLP suspects this could be pt's personality as she playfully stated self-deprecating or self-critical comments during the evaluation such as Well I guess I'm not as smart as you two. SLP to keep goals for awareness until ensured pt's awareness is WFL/WNL. Pt and husband both endorse changes in pt's short term memory. SLP to cont to focus on attention and memory in ST. Pt agrees.    08/22/24: Pt needs CLQT and PROM next visit. Given pt's cont'd complaints of memory difficulties, SLP reviewed memory strategies with pt and husband. Pt mentioned specific difficulty remembering people's names. SLP and pt/husband collaborated on developing strategy/ies for memory taking AM meds. SLP educated pt and husband about tasks pt could do at home to work with pt's cognitive deficits. Homework for attention and details.   07/27/24: SLP discussed results of pt's HVLT, explained rationale for further assessment, provided memory strategies which will need to be reviewed at first visit, explained some home tasks, and provided information re: memory and attention and interplay between them.  PATIENT EDUCATION: Education details: see treatment date Person educated: Patient and Spouse Education method: Explanation, Demonstration, and Handouts Education comprehension: verbalized understanding, returned demonstration, and needs further education   GOALS: Goals reviewed with patient? No  SHORT TERM GOALS: Target date:  09/21/24 (due to visit number)  Pt will undergo further cognitive testing Baseline: Goal status: met  2.  Pt will utilize at least 2 memory strategies successfully, with cues to use them, between two sessions Baseline: 09/07/24 Goal status: met  3.  Pt will demo knowledge of at least 3 deficit areas in order to provide appropriate compensations, in two sessions Baseline:  Goal status: deferred (memory  only)  4.  Pt will demo attention skills appropriate for min complex cognitive linguistic task in min-mod noisy environment for 8 minutes Baseline:  Goal status: met (per report)   LONG TERM GOALS: Target date: 09/28/24  Pt will improve PROM Baseline:  Goal status: INITIAL  2.  Pt will demo attention skills appropriate for alternating between min complex cognitive linguistic tasks in min noisy environment for 10 minutes, in 2 sessions Baseline:  Goal status: deferred - pt attention skills are at baseline  3.  Pt will demo ability to perform 3 household tasks with mod I (compensations), between 3 sessions Baseline: 10/05/24 Goal status: INITIAL  4.  Pt will functionally solve practical problems during cognitive linguistic tasks in 3 ST sessions Baseline:  Goal status: deferred - pt problem solving skills are at baseline   ASSESSMENT:  CLINICAL IMPRESSION: Patient is a 78 y.o. F who was seen today for treatment of cognitive linguistic skills from a lt ICH suffered on 06/19/24. See treatment date above for today's date for further details on today's session. Pt and husband agree pt's skills are primarily back to baseline, except memory. During the evaluation she demonstrated deficits in memory, attention, awareness, and problem solving.    OBJECTIVE IMPAIRMENTS: include attention, memory, awareness, and executive functioning. These impairments are limiting patient from managing medications, managing appointments, managing finances, household responsibilities, ADLs/IADLs, and effectively communicating at home and in community. Factors affecting potential to achieve goals and functional outcome are ability to learn/carryover information and severity of impairments.. Patient will benefit from skilled SLP services to address above impairments and improve overall function.  REHAB POTENTIAL: Good  PLAN:  SLP FREQUENCY: 1-2x/week  SLP DURATION: 4 weeks additional  PLANNED  INTERVENTIONS: Environmental controls, Cueing hierachy, Cognitive reorganization, Internal/external  aids, Functional tasks, Multimodal communication approach, SLP instruction and feedback, Compensatory strategies, Patient/family education, and [id number] Treatment of speech (30 or 45 min)     Aylee Littrell, CCC-SLP 10/12/2024, 10:26 AM

## 2024-10-12 NOTE — Addendum Note (Signed)
 Addended by: JACELYN PITTS B on: 10/12/2024 12:49 PM   Modules accepted: Orders

## 2024-10-15 ENCOUNTER — Ambulatory Visit

## 2024-10-17 DIAGNOSIS — R2689 Other abnormalities of gait and mobility: Secondary | ICD-10-CM | POA: Diagnosis not present

## 2024-10-17 DIAGNOSIS — I69198 Other sequelae of nontraumatic intracerebral hemorrhage: Secondary | ICD-10-CM | POA: Diagnosis not present

## 2024-10-17 DIAGNOSIS — R531 Weakness: Secondary | ICD-10-CM | POA: Diagnosis not present

## 2024-10-18 ENCOUNTER — Ambulatory Visit

## 2024-10-18 DIAGNOSIS — R41841 Cognitive communication deficit: Secondary | ICD-10-CM | POA: Diagnosis not present

## 2024-10-18 NOTE — Therapy (Signed)
 OUTPATIENT SPEECH LANGUAGE PATHOLOGY TREATMENT   Patient Name: Chelsea Barnett MRN: [id number] DOB:August 03, 1946, 78 y.o., female Today's Date: 10/18/2024  PCP: Loreli Fallow, MD REFERRING PROVIDER: Lenetta Dover, MD (ref), Doc sent to PCP  END OF SESSION:  End of Session - 10/18/24 1101     Visit Number 11    Number of Visits 17    Date for Recertification  11/23/24    SLP Start Time 0850    SLP Stop Time  0932    SLP Time Calculation (min) 42 min    Activity Tolerance Patient tolerated treatment well              Past Medical History:  Diagnosis Date   Basal cell cancer    right leg   Heart murmur    Hemorrhoids    Melanoma (HCC) 2005   on left arm   Migraines    with aura   Post-operative nausea and vomiting    after 2 surgeries   Psoriasis    Vaginal delivery    times 6   Past Surgical History:  Procedure Laterality Date   basal cell CA removal     BUNIONECTOMY Right    fallopian tube removal     laparoscopic bso- fibroma   melanoma removal     left upper arm   ovaries removed     laparoscopic bso- fibroma   REFRACTIVE SURGERY     Bil   TUBAL LIGATION  1984   WISDOM TOOTH EXTRACTION     Patient Active Problem List   Diagnosis Date Noted   Chronic fatigue syndrome 10/10/2024   Generalized anxiety disorder 10/10/2024   Hardening of the aorta (main artery of the heart) 10/10/2024   History of cerebrovascular accident 10/10/2024   History of subarachnoid hemorrhage 10/10/2024   Hyperlipidemia 10/10/2024   Insomnia 10/10/2024   Memory loss 10/10/2024   Tinnitus of both ears 10/10/2024   Sensorineural hearing loss, bilateral 09/01/2024   Hemorrhagic stroke (HCC) 06/19/2024   Lethargy 02/15/2024   Multiple premature ventricular complexes 02/15/2024   Right arm weakness 06/28/2023   CVA (cerebral vascular accident) (HCC) 06/28/2023   Essential hypertension 06/28/2023   History of skin cancer 06/28/2023   Nontraumatic subarachnoid  hemorrhage (HCC) 06/28/2023   Rectocele 06/06/2020   Midline cystocele 01/16/2020   Uterovaginal prolapse, incomplete 01/16/2020   Chest pain on breathing 11/19/2013   Heart murmur, systolic 08/29/2013   Pure hypercholesterolemia 06/01/2012   Edema 06/01/2012   Weight loss 06/01/2012   Psoriasis 06/01/2012     ONSET DATE: 06/19/24   REFERRING DIAG: Lenetta Dover, MD  THERAPY DIAG:  Cognitive communication deficit  Rationale for Evaluation and Treatment: Rehabilitation  SUBJECTIVE:   SUBJECTIVE STATEMENT: He gave me a good report, didn't he?  Pt accompanied by: significant other husband [name]  PERTINENT HISTORY: Arrived at ED 6/24 with R sided weakness. CTH showed acute parenchymal hemorrhage within the anterior medial L frontal lobe along with an acute SDH along the L aspect of the falx. PMH includes TIA (deemed to be amyloid spells after further neuro w/u), prior CVA, cerebral hemorrhage possibly amyloid angiopathy   PAIN:  Are you having pain? No  FALLS: Has patient fallen in last 6 months?  Number of falls: two (with CVA, and one in IPR (per documentation))   PATIENT GOALS: Improve cognition  OBJECTIVE:  Note: Objective measures were completed at Evaluation unless otherwise noted.  DIAGNOSTIC FINDINGS:  SLE 06/19/24 Pt reports being independent with all  tasks at baseline. She scored WFL on all subsections of the Cognistat except delayed recall and calculations, both of which she reports having difficulty with PTA. Discussed at least brief SLP f/u to target functional task of medication management, with which pt and her daughter are agreeable. Will continue following.  MRI 06/19/24 IMPRESSION: 1. Left frontal lobe cerebral hematoma without significant interval change. No evidence of underlying mass or vascular malformation. 2. Stable left parafalcine subdural hematoma. 3. Chronic hemosiderin staining within the left precentral sulcus from remote subarachnoid  hemorrhage.   STANDARDIZED ASSESSMENTS: Hopkins Verbal Learning Test - RECALL: 4/12 (below WNL), 6/12 (below WNL), 7/12 (below WNL) RECOGNITION: 9 (>3 standard dev below mean). These scores indicate there may well be an attention component to pt's memory disorder and/or that she has difficulty encoding memories at this time.   Cognitive Linguistic Quick Test  AGE - 70-89  The Cognitive Linguistic Quick Test (CLQT) was administered to assess the relative status of five cognitive domains: attention, memory, language, executive functioning, and visuospatial skills. Scores from 10 tasks were used to estimate severity ratings (standardized for age groups 18-69 years and 70-89 years) for each domain, a clock drawing task, as well as an overall composite severity rating of cognition.      Task Score Criterion Cut Scores  Personal Facts 8/8 8  Symbol Cancellation 12/12 10  Confrontation Naming 10/10 10  Clock Drawing  11/13 11  Story Retelling 6/10 5  Symbol Trails 10/10 6  Generative Naming 6/9 4  Design Memory 5/6 4  Mazes  7/8 4  Design Generation 8/13 5    Cognitive Domain Composite Score Severity Rating  Attention 196/215 WNL  Memory 148/185 WNL  Executive Function 31/40 WNL  Language 32/37 WNL  Visuospatial Skills 93/105 WNL  Clock Drawing  11/13 WNL  Composite Severity Rating  WNL      PATIENT REPORTED OUTCOME MEASURES (PROM): Cognitive Function: pt returned today with totaled 81/130 with lower scores indicating less of an impact on pt's daily life/QoL.                                                                                                                             TREATMENT DATE:   10/18/24: Pt will ensure she has a picture of her puzzle and show it to SLP next session. [name] wrote this in her notebook to cue her to remember.  Pt brought her notebook but did not have anything specific about Dr. Loreli appointment other than she had told him the notes she wrote in this  session last time. Today SLP printed out pt's November schedule for her and pt used alternating attention to write the dates and times in her notebook. She completed a functional math task (parking meter) of 6 questions in 8-9 minutes. Remember, I don't do wrod problems, pt stated. SLP then suggested to husband how to make everyday items therapeutic.  10/12/24: Pt entered without specific recall of  events at neurologist appointment two days ago. SLP inquired how pt could recall more details and she said bring her journal which SLP affirmed. SLP then had pt write down the points she wanted to share with her PCP after her ST appointment today. She req'd usual min-mod A for recall of who she has seen (MD and specialists) and consistent min-mod A for details for each. SLP provided examples of critical thinking tasks she could perform at home for improving cognition. Pt appeared reticent to try these as she expressed skepticism she could complete SLP's suggestions.   10/05/24: Pt and husband report to SLP that pt is primarily at baseline and that memory is really pt's only deficit area remaining. SLP observed husband providing appropriate cues for pt's memory during session today and encouraged husband to continue with this. Pt uses calendar appropriately at home. SLP reviewed memory strategies with pt today - printed memory strategies for pt again. Pt also told SLP that she started writing a journal with some things but now cannot find the journal. SLP suggested putting the journal in ONE place and only one place. Pt, husband, and SLP agreed pt could decr to once/week.  09/18/24: SLP assisted pt in checking her homework. Pt was 88% accurate with her responses.- three errors remained. Pt req'd mod-max A usually to correct. SLP highly encouraged pt to cook something she knows (Christmas toffee, and granola) for homework for attention and details. [name] to assist with toffee, and CNA to assist with granola. SLP told  [name] and pt that CNA should not TELL [name] what she 's done wrong just alert her she's done something wrong to see if she can correct spontaneously without any more cues. [name] still asks [name] multiple times (three today) when the appointments are instead of spontaneously using her calendar.   09/11/24: SLP assisted pt with homework corrections. Pt success 85% overall, but when she double checked her errors her success improved to 95%. She was aware of 4/6 errors. Extra time consistently necessary for details and problem solving. In an alternating attention min-mod detailed task she req'd extra time consistently, and occasional min A for attention to detail. Homework provided.   09/07/24: Pt with 87% success initially with detailed written instructions. Awareness 77% of the time of errors. Pt req'd cues for double checking work. SLP and pt and husband talked for a while about games and tasks pt can practice at home that would be beneficial for cognition as husbnad is going away for a week and friends will be checking in on [name] regularly in the afternoons (attendant there in the mornings). Pt is using calendar regularly and notes at home. Has to go back to the calendar regularly and does not necessarily recall information there.   09/05/24: SLP guided pt through checking her homework. Multiple errors, approx awareness 60%. Pt with evidence of reduced sustained and selective (internal distraction) attention hindering accuracy, awareness, and problem solving. SLP provided one homework task a second time and asked [name] to assist giving as few hints as possible, allowing pt to do her work as much as possible.   08/31/24: SLP discussed results of CLQT with pt and husband - pt's husband with questions about this. When asked, pt stated she was not at baseline and, specifically, I still have trouble remembering things - but that was going on before. SLP reviewed with pt that we are concerned about changes after CVA.  Husband stated he thinks pt is at approx 80% of baseline.  SLP had pt check her homework as she skipped 3 entries on one handout and did not correct her errors on the other (code from the last letters of the word). Pt req'd assistance with alternating attention with both tasks as she lost her place, and found it again 50% of the time (incr'd to 100% with mod A from SLP).  08/28/24: CLQT today. Even though pt scored WNL, SLP has noted some s/sx of mild attention deficits, which husband suspects is different from premorbid status. Pt had difficulty with a simple task targeting alternating attention, which is parallel with Steve's comment in s. When pt was asked she did not explain away deficits but did not endorse either. SLP suspects this could be pt's personality as she playfully stated self-deprecating or self-critical comments during the evaluation such as Well I guess I'm not as smart as you two. SLP to keep goals for awareness until ensured pt's awareness is WFL/WNL. Pt and husband both endorse changes in pt's short term memory. SLP to cont to focus on attention and memory in ST. Pt agrees.    08/22/24: Pt needs CLQT and PROM next visit. Given pt's cont'd complaints of memory difficulties, SLP reviewed memory strategies with pt and husband. Pt mentioned specific difficulty remembering people's names. SLP and pt/husband collaborated on developing strategy/ies for memory taking AM meds. SLP educated pt and husband about tasks pt could do at home to work with pt's cognitive deficits. Homework for attention and details.   07/27/24: SLP discussed results of pt's HVLT, explained rationale for further assessment, provided memory strategies which will need to be reviewed at first visit, explained some home tasks, and provided information re: memory and attention and interplay between them.  PATIENT EDUCATION: Education details: see treatment date Person educated: Patient and Spouse Education method:  Explanation, Demonstration, and Handouts Education comprehension: verbalized understanding, returned demonstration, and needs further education   GOALS: Goals reviewed with patient? No  SHORT TERM GOALS: Target date:  09/21/24 (due to visit number)  Pt will undergo further cognitive testing Baseline: Goal status: met  2.  Pt will utilize at least 2 memory strategies successfully, with cues to use them, between two sessions Baseline: 09/07/24 Goal status: met  3.  Pt will demo knowledge of at least 3 deficit areas in order to provide appropriate compensations, in two sessions Baseline:  Goal status: deferred (memory only)  4.  Pt will demo attention skills appropriate for min complex cognitive linguistic task in min-mod noisy environment for 8 minutes Baseline:  Goal status: met (per report)   LONG TERM GOALS: Target date:  11/23/24  Pt will improve PROM Baseline:  Goal status: INITIAL  2.  Pt will demo attention skills appropriate for alternating between min complex cognitive linguistic tasks in min noisy environment for 10 minutes, in 2 sessions Baseline:  Goal status: deferred - pt attention skills are at baseline  3.  Pt will demo ability to perform 3 household tasks with mod I (compensations), between 3 sessions Baseline: 10/05/24, 10/18/24 Goal status: INITIAL  4.  Pt will functionally solve practical problems during cognitive linguistic tasks in 3 ST sessions Baseline:  Goal status: deferred - pt problem solving skills are at baseline   ASSESSMENT:  CLINICAL IMPRESSION: Patient is a 78 y.o. F who was seen today for treatment of cognitive linguistic skills from a lt ICH suffered on 06/19/24. See treatment date above for today's date for further details on today's session. Pt and husband agree pt's skills  are primarily back to baseline, except memory. During the evaluation she demonstrated deficits in memory, attention, awareness, and problem solving.    OBJECTIVE  IMPAIRMENTS: include attention, memory, awareness, and executive functioning. These impairments are limiting patient from managing medications, managing appointments, managing finances, household responsibilities, ADLs/IADLs, and effectively communicating at home and in community. Factors affecting potential to achieve goals and functional outcome are ability to learn/carryover information and severity of impairments.. Patient will benefit from skilled SLP services to address above impairments and improve overall function.  REHAB POTENTIAL: Good  PLAN:  SLP FREQUENCY: 1-2x/week  SLP DURATION: 4 weeks additional  PLANNED INTERVENTIONS: Environmental controls, Cueing hierachy, Cognitive reorganization, Internal/external aids, Functional tasks, Multimodal communication approach, SLP instruction and feedback, Compensatory strategies, Patient/family education, and [id number] Treatment of speech (30 or 45 min)     Denai Caba, CCC-SLP 10/18/2024, 11:01 AM

## 2024-10-24 DIAGNOSIS — R2689 Other abnormalities of gait and mobility: Secondary | ICD-10-CM | POA: Diagnosis not present

## 2024-10-24 DIAGNOSIS — I69198 Other sequelae of nontraumatic intracerebral hemorrhage: Secondary | ICD-10-CM | POA: Diagnosis not present

## 2024-10-24 DIAGNOSIS — R531 Weakness: Secondary | ICD-10-CM | POA: Diagnosis not present

## 2024-11-02 ENCOUNTER — Ambulatory Visit (INDEPENDENT_AMBULATORY_CARE_PROVIDER_SITE_OTHER): Admitting: Audiology

## 2024-11-02 DIAGNOSIS — H903 Sensorineural hearing loss, bilateral: Secondary | ICD-10-CM

## 2024-11-02 NOTE — Progress Notes (Signed)
  [address] [place], [place] [address] [phone]  Hearing Aid Check     Chelsea Barnett comes for a scheduled appointment for a hearing aid check.   Accompanied ab:[email or web address]   Right Left  Hearing aid manufacturer [employer] Real 1 miniRITE [id number] [employer] Real 1 miniRITE SN:[id number]  Hearing aid style Receiver in the canal Receiver in the canal  Hearing aid battery rechargeable rechargeable  Receiver    Dome/ custom earpiece 6mm open dome 6mm open dome  Retention wire yes yes  Warranty expiration date 09-12-2025 09-12-2025  Loss and Damage unknown unknown  Initial fitting date 08-19-2022 08-19-2022  Device [facility] fit [facility]: [facility]    Chief complaint: Patient reports she wears them sometimes. Doe snot notice any problem with them. Husband asked for her aids programming to be checked.    Actions taken:   Reprogrammed based on last audio 09-28-2024. Patient pleased with the sounds  Services fee: $0 was paid at checkout.  Patient was oriented about returning for a hearing aid check to have real ear measurements if she noticed she struggled hearing. Or wanted settings to be objectively verified.  Recommend: Return for a hearing aid check , as needed. Return for a hearing evaluation and to see an ENT, if concerns with hearing changes arise.    Tacora Athanas MARIE LEROUX-MARTINEZ, AUD

## 2024-11-06 ENCOUNTER — Ambulatory Visit

## 2024-11-06 DIAGNOSIS — Z1231 Encounter for screening mammogram for malignant neoplasm of breast: Secondary | ICD-10-CM | POA: Diagnosis not present

## 2024-11-08 ENCOUNTER — Ambulatory Visit

## 2024-11-13 ENCOUNTER — Ambulatory Visit: Attending: Student in an Organized Health Care Education/Training Program

## 2024-11-13 DIAGNOSIS — R41841 Cognitive communication deficit: Secondary | ICD-10-CM | POA: Diagnosis present

## 2024-11-13 NOTE — Therapy (Unsigned)
 OUTPATIENT SPEECH LANGUAGE PATHOLOGY TREATMENT   Patient Name: Chelsea Barnett MRN: [id number] DOB:July 27, 1946, 78 y.o., female Today's Date: 11/13/2024  PCP: Chelsea Fallow, MD REFERRING PROVIDER: Lenetta Dover, MD (ref), Doc sent to PCP  END OF SESSION:  End of Session - 11/13/24 1458     Visit Number 12    Number of Visits 17    Date for Recertification  11/23/24    SLP Start Time 1455   checked in 1455   SLP Stop Time  1530    SLP Time Calculation (min) 35 min    Activity Tolerance Patient tolerated treatment well              Past Medical History:  Diagnosis Date   Basal cell cancer    right leg   Heart murmur    Hemorrhoids    Melanoma (HCC) 2005   on left arm   Migraines    with aura   Post-operative nausea and vomiting    after 2 surgeries   Psoriasis    Vaginal delivery    times 6   Past Surgical History:  Procedure Laterality Date   basal cell CA removal     BUNIONECTOMY Right    fallopian tube removal     laparoscopic bso- fibroma   melanoma removal     left upper arm   ovaries removed     laparoscopic bso- fibroma   REFRACTIVE SURGERY     Bil   TUBAL LIGATION  1984   WISDOM TOOTH EXTRACTION     Patient Active Problem List   Diagnosis Date Noted   Chronic fatigue syndrome 10/10/2024   Generalized anxiety disorder 10/10/2024   Hardening of the aorta (main artery of the heart) 10/10/2024   History of cerebrovascular accident 10/10/2024   History of subarachnoid hemorrhage 10/10/2024   Hyperlipidemia 10/10/2024   Insomnia 10/10/2024   Memory loss 10/10/2024   Tinnitus of both ears 10/10/2024   Sensorineural hearing loss, bilateral 09/01/2024   Hemorrhagic stroke (HCC) 06/19/2024   Lethargy 02/15/2024   Multiple premature ventricular complexes 02/15/2024   Right arm weakness 06/28/2023   CVA (cerebral vascular accident) (HCC) 06/28/2023   Essential hypertension 06/28/2023   History of skin cancer 06/28/2023   Nontraumatic  subarachnoid hemorrhage (HCC) 06/28/2023   Rectocele 06/06/2020   Midline cystocele 01/16/2020   Uterovaginal prolapse, incomplete 01/16/2020   Chest pain on breathing 11/19/2013   Heart murmur, systolic 08/29/2013   Pure hypercholesterolemia 06/01/2012   Edema 06/01/2012   Weight loss 06/01/2012   Psoriasis 06/01/2012     ONSET DATE: 06/19/24   REFERRING DIAG: Chelsea Dover, MD  THERAPY DIAG:  Cognitive communication deficit  Rationale for Evaluation and Treatment: Rehabilitation  SUBJECTIVE:   SUBJECTIVE STATEMENT: I feel like I'm about 75%.  Pt accompanied by: significant other husband Chelsea Barnett  PERTINENT HISTORY: Arrived at ED 6/24 with R sided weakness. CTH showed acute parenchymal hemorrhage within the anterior medial L frontal lobe along with an acute SDH along the L aspect of the falx. PMH includes TIA (deemed to be amyloid spells after further neuro w/u), prior CVA, cerebral hemorrhage possibly amyloid angiopathy   PAIN:  Are you having pain? No  FALLS: Has patient fallen in last 6 months?  Number of falls: two (with CVA, and one in IPR (per documentation))   PATIENT GOALS: Improve cognition  OBJECTIVE:  Note: Objective measures were completed at Evaluation unless otherwise noted.  DIAGNOSTIC FINDINGS:  SLE 06/19/24 Pt reports being independent  with all tasks at baseline. She scored WFL on all subsections of the Cognistat except delayed recall and calculations, both of which she reports having difficulty with PTA. Discussed at least brief SLP f/u to target functional task of medication management, with which pt and her daughter are agreeable. Will continue following.  MRI 06/19/24 IMPRESSION: 1. Left frontal lobe cerebral hematoma without significant interval change. No evidence of underlying mass or vascular malformation. 2. Stable left parafalcine subdural hematoma. 3. Chronic hemosiderin staining within the left precentral sulcus from remote subarachnoid  hemorrhage.   STANDARDIZED ASSESSMENTS: Hopkins Verbal Learning Test - RECALL: 4/12 (below WNL), 6/12 (below WNL), 7/12 (below WNL) RECOGNITION: 9 (>3 standard dev below mean). These scores indicate there may well be an attention component to pt's memory disorder and/or that she has difficulty encoding memories at this time.   Cognitive Linguistic Quick Test  AGE - 70-89  The Cognitive Linguistic Quick Test (CLQT) was administered to assess the relative status of five cognitive domains: attention, memory, language, executive functioning, and visuospatial skills. Scores from 10 tasks were used to estimate severity ratings (standardized for age groups 18-69 years and 70-89 years) for each domain, a clock drawing task, as well as an overall composite severity rating of cognition.      Task Score Criterion Cut Scores  Personal Facts 8/8 8  Symbol Cancellation 12/12 10  Confrontation Naming 10/10 10  Clock Drawing  11/13 11  Story Retelling 6/10 5  Symbol Trails 10/10 6  Generative Naming 6/9 4  Design Memory 5/6 4  Mazes  7/8 4  Design Generation 8/13 5    Cognitive Domain Composite Score Severity Rating  Attention 196/215 WNL  Memory 148/185 WNL  Executive Function 31/40 WNL  Language 32/37 WNL  Visuospatial Skills 93/105 WNL  Clock Drawing  11/13 WNL  Composite Severity Rating  WNL      PATIENT REPORTED OUTCOME MEASURES (PROM): Cognitive Function: pt returned today with totaled 81/130 with lower scores indicating less of an impact on pt's daily life/QoL.                                                                                                                             TREATMENT DATE:   11/13/24: Pt needs long form for Cognitive Function PROM next session. Chelsea Barnett reports feeling approx 75% of baseline. She has not been to MD since last appointment so has not used her notebook at appointments. She has, less than the suggested frequency if at all, used real life as therapy  (e.g, if going somewhere have Chelsea Barnett plan the route). Chelsea Barnett was asked directly if she was bothered by her remaining deficits and she answered she is not. Chelsea Barnett did not indicate any concern about Chelsea Barnett's statement communicating this. Pt was administered SLUMS and she scored 25/30 (WNL= 27/30). She lost 3 points on mental math, and two points on recall of 5 random words. She, Chelsea Barnett, and SLP agreed that pt  could return in about one month to verify cont'd progress.  10/18/24: Pt will ensure she has a picture of her puzzle and show it to SLP next session. Chelsea Barnett wrote this in her notebook to cue her to remember.  Pt brought her notebook but did not have anything specific about Dr. Loreli appointment other than she had told him the notes she wrote in this session last time. Today SLP printed out pt's November schedule for her and pt used alternating attention to write the dates and times in her notebook. She completed a functional math task (parking meter) of 6 questions in 8-9 minutes. Remember, I don't do wrod problems, pt stated. SLP then suggested to husband how to make everyday items therapeutic.  10/12/24: Pt entered without specific recall of events at neurologist appointment two days ago. SLP inquired how pt could recall more details and she said bring her journal which SLP affirmed. SLP then had pt write down the points she wanted to share with her PCP after her ST appointment today. She req'd usual min-mod A for recall of who she has seen (MD and specialists) and consistent min-mod A for details for each. SLP provided examples of critical thinking tasks she could perform at home for improving cognition. Pt appeared reticent to try these as she expressed skepticism she could complete SLP's suggestions.   10/05/24: Pt and husband report to SLP that pt is primarily at baseline and that memory is really pt's only deficit area remaining. SLP observed husband providing appropriate cues for pt's memory during session  today and encouraged husband to continue with this. Pt uses calendar appropriately at home. SLP reviewed memory strategies with pt today - printed memory strategies for pt again. Pt also told SLP that she started writing a journal with some things but now cannot find the journal. SLP suggested putting the journal in ONE place and only one place. Pt, husband, and SLP agreed pt could decr to once/week.  09/18/24: SLP assisted pt in checking her homework. Pt was 88% accurate with her responses.- three errors remained. Pt req'd mod-max A usually to correct. SLP highly encouraged pt to cook something she knows (Christmas toffee, and granola) for homework for attention and details. Chelsea Barnett to assist with toffee, and CNA to assist with granola. SLP told Chelsea Barnett and pt that CNA should not TELL Chelsea Barnett what she 's done wrong just alert her she's done something wrong to see if she can correct spontaneously without any more cues. Chelsea Barnett still asks Chelsea Barnett multiple times (three today) when the appointments are instead of spontaneously using her calendar.   09/11/24: SLP assisted pt with homework corrections. Pt success 85% overall, but when she double checked her errors her success improved to 95%. She was aware of 4/6 errors. Extra time consistently necessary for details and problem solving. In an alternating attention min-mod detailed task she req'd extra time consistently, and occasional min A for attention to detail. Homework provided.   09/07/24: Pt with 87% success initially with detailed written instructions. Awareness 77% of the time of errors. Pt req'd cues for double checking work. SLP and pt and husband talked for a while about games and tasks pt can practice at home that would be beneficial for cognition as husbnad is going away for a week and friends will be checking in on Chelsea Barnett regularly in the afternoons (attendant there in the mornings). Pt is using calendar regularly and notes at home. Has to go back to the calendar  regularly  and does not necessarily recall information there.   09/05/24: SLP guided pt through checking her homework. Multiple errors, approx awareness 60%. Pt with evidence of reduced sustained and selective (internal distraction) attention hindering accuracy, awareness, and problem solving. SLP provided one homework task a second time and asked Chelsea Barnett to assist giving as few hints as possible, allowing pt to do her work as much as possible.   08/31/24: SLP discussed results of CLQT with pt and husband - pt's husband with questions about this. When asked, pt stated she was not at baseline and, specifically, I still have trouble remembering things - but that was going on before. SLP reviewed with pt that we are concerned about changes after CVA. Husband stated he thinks pt is at approx 80% of baseline. SLP had pt check her homework as she skipped 3 entries on one handout and did not correct her errors on the other (code from the last letters of the word). Pt req'd assistance with alternating attention with both tasks as she lost her place, and found it again 50% of the time (incr'd to 100% with mod A from SLP).  08/28/24: CLQT today. Even though pt scored WNL, SLP has noted some s/sx of mild attention deficits, which husband suspects is different from premorbid status. Pt had difficulty with a simple task targeting alternating attention, which is parallel with Steve's comment in s. When pt was asked she did not explain away deficits but did not endorse either. SLP suspects this could be pt's personality as she playfully stated self-deprecating or self-critical comments during the evaluation such as Well I guess I'm not as smart as you two. SLP to keep goals for awareness until ensured pt's awareness is WFL/WNL. Pt and husband both endorse changes in pt's short term memory. SLP to cont to focus on attention and memory in ST. Pt agrees.    08/22/24: Pt needs CLQT and PROM next visit. Given pt's cont'd  complaints of memory difficulties, SLP reviewed memory strategies with pt and husband. Pt mentioned specific difficulty remembering people's names. SLP and pt/husband collaborated on developing strategy/ies for memory taking AM meds. SLP educated pt and husband about tasks pt could do at home to work with pt's cognitive deficits. Homework for attention and details.   07/27/24: SLP discussed results of pt's HVLT, explained rationale for further assessment, provided memory strategies which will need to be reviewed at first visit, explained some home tasks, and provided information re: memory and attention and interplay between them.  PATIENT EDUCATION: Education details: see treatment date Person educated: Patient and Spouse Education method: Explanation, Demonstration, and Handouts Education comprehension: verbalized understanding, returned demonstration, and needs further education   GOALS: Goals reviewed with patient? No  SHORT TERM GOALS: Target date:  09/21/24 (due to visit number)  Pt will undergo further cognitive testing Baseline: Goal status: met  2.  Pt will utilize at least 2 memory strategies successfully, with cues to use them, between two sessions Baseline: 09/07/24 Goal status: met  3.  Pt will demo knowledge of at least 3 deficit areas in order to provide appropriate compensations, in two sessions Baseline:  Goal status: deferred (memory only)  4.  Pt will demo attention skills appropriate for min complex cognitive linguistic task in min-mod noisy environment for 8 minutes Baseline:  Goal status: met (per report)   LONG TERM GOALS: Target date:  11/23/24  Pt will improve PROM Baseline:  Goal status: INITIAL  2.  Pt will demo attention skills  appropriate for alternating between min complex cognitive linguistic tasks in min noisy environment for 10 minutes, in 2 sessions Baseline:  Goal status: deferred - pt attention skills are at baseline  3.  Pt will demo  ability to perform 3 household tasks with mod I (compensations), between 3 sessions Baseline: 10/05/24, 10/18/24 Goal status: INITIAL  4.  Pt will functionally solve practical problems during cognitive linguistic tasks in 3 ST sessions Baseline:  Goal status: deferred - pt problem solving skills are at baseline   ASSESSMENT:  CLINICAL IMPRESSION: Patient is a 78 y.o. F who was seen today for treatment of cognitive linguistic skills from a lt ICH suffered on 06/19/24. See treatment date above for today's date for further details on today's session. Pt and husband agree pt's skills are primarily back to baseline, except memory. During the evaluation she demonstrated deficits in memory, attention, awareness, and problem solving.    OBJECTIVE IMPAIRMENTS: include attention, memory, awareness, and executive functioning. These impairments are limiting patient from managing medications, managing appointments, managing finances, household responsibilities, ADLs/IADLs, and effectively communicating at home and in community. Factors affecting potential to achieve goals and functional outcome are ability to learn/carryover information and severity of impairments.. Patient will benefit from skilled SLP services to address above impairments and improve overall function.  REHAB POTENTIAL: Good  PLAN:  SLP FREQUENCY: 1-2x/week  SLP DURATION: 4 weeks additional  PLANNED INTERVENTIONS: Environmental controls, Cueing hierachy, Cognitive reorganization, Internal/external aids, Functional tasks, Multimodal communication approach, SLP instruction and feedback, Compensatory strategies, Patient/family education, and [id number] Treatment of speech (30 or 45 min)     Saskia Simerson, CCC-SLP 11/13/2024, 2:58 PM

## 2024-11-15 ENCOUNTER — Ambulatory Visit

## 2024-11-20 ENCOUNTER — Ambulatory Visit

## 2024-12-05 DIAGNOSIS — R531 Weakness: Secondary | ICD-10-CM | POA: Diagnosis not present

## 2024-12-05 DIAGNOSIS — I69198 Other sequelae of nontraumatic intracerebral hemorrhage: Secondary | ICD-10-CM | POA: Diagnosis not present

## 2024-12-05 DIAGNOSIS — R2689 Other abnormalities of gait and mobility: Secondary | ICD-10-CM | POA: Diagnosis not present

## 2024-12-10 NOTE — Therapy (Incomplete)
 OUTPATIENT SPEECH LANGUAGE PATHOLOGY TREATMENT   Patient Name: Chelsea Barnett MRN: [id number] DOB:02-18-1946, 78 y.o., female Today's Date: 12/10/2024  PCP: Loreli Fallow, MD REFERRING PROVIDER: Lenetta Dover, MD (ref), Doc sent to PCP  END OF SESSION:        Past Medical History:  Diagnosis Date   Basal cell cancer    right leg   Heart murmur    Hemorrhoids    Melanoma (HCC) 2005   on left arm   Migraines    with aura   Post-operative nausea and vomiting    after 2 surgeries   Psoriasis    Vaginal delivery    times 6   Past Surgical History:  Procedure Laterality Date   basal cell CA removal     BUNIONECTOMY Right    fallopian tube removal     laparoscopic bso- fibroma   melanoma removal     left upper arm   ovaries removed     laparoscopic bso- fibroma   REFRACTIVE SURGERY     Bil   TUBAL LIGATION  1984   WISDOM TOOTH EXTRACTION     Patient Active Problem List   Diagnosis Date Noted   Chronic fatigue syndrome 10/10/2024   Generalized anxiety disorder 10/10/2024   Hardening of the aorta (main artery of the heart) 10/10/2024   History of cerebrovascular accident 10/10/2024   History of subarachnoid hemorrhage 10/10/2024   Hyperlipidemia 10/10/2024   Insomnia 10/10/2024   Memory loss 10/10/2024   Tinnitus of both ears 10/10/2024   Sensorineural hearing loss, bilateral 09/01/2024   Hemorrhagic stroke (HCC) 06/19/2024   Lethargy 02/15/2024   Multiple premature ventricular complexes 02/15/2024   Right arm weakness 06/28/2023   CVA (cerebral vascular accident) (HCC) 06/28/2023   Essential hypertension 06/28/2023   History of skin cancer 06/28/2023   Nontraumatic subarachnoid hemorrhage (HCC) 06/28/2023   Rectocele 06/06/2020   Midline cystocele 01/16/2020   Uterovaginal prolapse, incomplete 01/16/2020   Chest pain on breathing 11/19/2013   Heart murmur, systolic 08/29/2013   Pure hypercholesterolemia 06/01/2012   Edema 06/01/2012   Weight  loss 06/01/2012   Psoriasis 06/01/2012     ONSET DATE: 06/19/24   REFERRING DIAG: Lenetta Dover, MD  THERAPY DIAG:  No diagnosis found.  Rationale for Evaluation and Treatment: Rehabilitation  SUBJECTIVE:   SUBJECTIVE STATEMENT: I feel like I'm about 75%.  Pt accompanied by: significant other husband [name]  PERTINENT HISTORY: Arrived at ED 6/24 with R sided weakness. CTH showed acute parenchymal hemorrhage within the anterior medial L frontal lobe along with an acute SDH along the L aspect of the falx. PMH includes TIA (deemed to be amyloid spells after further neuro w/u), prior CVA, cerebral hemorrhage possibly amyloid angiopathy   PAIN:  Are you having pain? No  FALLS: Has patient fallen in last 6 months?  Number of falls: two (with CVA, and one in IPR (per documentation))   PATIENT GOALS: Improve cognition  OBJECTIVE:  Note: Objective measures were completed at Evaluation unless otherwise noted.  DIAGNOSTIC FINDINGS:  SLE 06/19/24 Pt reports being independent with all tasks at baseline. She scored WFL on all subsections of the Cognistat except delayed recall and calculations, both of which she reports having difficulty with PTA. Discussed at least brief SLP f/u to target functional task of medication management, with which pt and her daughter are agreeable. Will continue following.  MRI 06/19/24 IMPRESSION: 1. Left frontal lobe cerebral hematoma without significant interval change. No evidence of underlying mass or  vascular malformation. 2. Stable left parafalcine subdural hematoma. 3. Chronic hemosiderin staining within the left precentral sulcus from remote subarachnoid hemorrhage.   STANDARDIZED ASSESSMENTS: Hopkins Verbal Learning Test - RECALL: 4/12 (below WNL), 6/12 (below WNL), 7/12 (below WNL) RECOGNITION: 9 (>3 standard dev below mean). These scores indicate there may well be an attention component to pt's memory disorder and/or that she has difficulty  encoding memories at this time.   Cognitive Linguistic Quick Test  AGE - 70-89  The Cognitive Linguistic Quick Test (CLQT) was administered to assess the relative status of five cognitive domains: attention, memory, language, executive functioning, and visuospatial skills. Scores from 10 tasks were used to estimate severity ratings (standardized for age groups 18-69 years and 70-89 years) for each domain, a clock drawing task, as well as an overall composite severity rating of cognition.      Task Score Criterion Cut Scores  Personal Facts 8/8 8  Symbol Cancellation 12/12 10  Confrontation Naming 10/10 10  Clock Drawing  11/13 11  Story Retelling 6/10 5  Symbol Trails 10/10 6  Generative Naming 6/9 4  Design Memory 5/6 4  Mazes  7/8 4  Design Generation 8/13 5    Cognitive Domain Composite Score Severity Rating  Attention 196/215 WNL  Memory 148/185 WNL  Executive Function 31/40 WNL  Language 32/37 WNL  Visuospatial Skills 93/105 WNL  Clock Drawing  11/13 WNL  Composite Severity Rating  WNL      PATIENT REPORTED OUTCOME MEASURES (PROM): Cognitive Function: pt returned today with totaled 81/130 with lower scores indicating less of an impact on pt's daily life/QoL.                                                                                                                             TREATMENT DATE:   11/13/24: Pt needs long form for Cognitive Function PROM next session. [name] reports feeling approx 75% of baseline. She has not been to MD since last appointment so has not used her notebook at appointments. She has, less than the suggested frequency if at all, used real life as therapy (e.g, if going somewhere have [name] plan the route). [name] was asked directly if she was bothered by her remaining deficits and she answered she is not. [name] did not indicate any concern about [name]'s statement communicating this. Pt was administered SLUMS and she scored 25/30 (WNL= 27/30). She lost 3  points on mental math, and two points on recall of 5 random words. She, [name], and SLP agreed that pt could return in about one month to verify cont'd progress.  10/18/24: Pt will ensure she has a picture of her puzzle and show it to SLP next session. [name] wrote this in her notebook to cue her to remember.  Pt brought her notebook but did not have anything specific about Dr. Loreli appointment other than she had told him the notes she wrote in this session last time. Today SLP  printed out pt's November schedule for her and pt used alternating attention to write the dates and times in her notebook. She completed a functional math task (parking meter) of 6 questions in 8-9 minutes. Remember, I don't do wrod problems, pt stated. SLP then suggested to husband how to make everyday items therapeutic.  10/12/24: Pt entered without specific recall of events at neurologist appointment two days ago. SLP inquired how pt could recall more details and she said bring her journal which SLP affirmed. SLP then had pt write down the points she wanted to share with her PCP after her ST appointment today. She req'd usual min-mod A for recall of who she has seen (MD and specialists) and consistent min-mod A for details for each. SLP provided examples of critical thinking tasks she could perform at home for improving cognition. Pt appeared reticent to try these as she expressed skepticism she could complete SLP's suggestions.   10/05/24: Pt and husband report to SLP that pt is primarily at baseline and that memory is really pt's only deficit area remaining. SLP observed husband providing appropriate cues for pt's memory during session today and encouraged husband to continue with this. Pt uses calendar appropriately at home. SLP reviewed memory strategies with pt today - printed memory strategies for pt again. Pt also told SLP that she started writing a journal with some things but now cannot find the journal. SLP suggested putting  the journal in ONE place and only one place. Pt, husband, and SLP agreed pt could decr to once/week.  09/18/24: SLP assisted pt in checking her homework. Pt was 88% accurate with her responses.- three errors remained. Pt req'd mod-max A usually to correct. SLP highly encouraged pt to cook something she knows (Christmas toffee, and granola) for homework for attention and details. [name] to assist with toffee, and CNA to assist with granola. SLP told [name] and pt that CNA should not TELL [name] what she 's done wrong just alert her she's done something wrong to see if she can correct spontaneously without any more cues. [name] still asks [name] multiple times (three today) when the appointments are instead of spontaneously using her calendar.   09/11/24: SLP assisted pt with homework corrections. Pt success 85% overall, but when she double checked her errors her success improved to 95%. She was aware of 4/6 errors. Extra time consistently necessary for details and problem solving. In an alternating attention min-mod detailed task she req'd extra time consistently, and occasional min A for attention to detail. Homework provided.   09/07/24: Pt with 87% success initially with detailed written instructions. Awareness 77% of the time of errors. Pt req'd cues for double checking work. SLP and pt and husband talked for a while about games and tasks pt can practice at home that would be beneficial for cognition as husbnad is going away for a week and friends will be checking in on [name] regularly in the afternoons (attendant there in the mornings). Pt is using calendar regularly and notes at home. Has to go back to the calendar regularly and does not necessarily recall information there.   09/05/24: SLP guided pt through checking her homework. Multiple errors, approx awareness 60%. Pt with evidence of reduced sustained and selective (internal distraction) attention hindering accuracy, awareness, and problem solving. SLP provided  one homework task a second time and asked [name] to assist giving as few hints as possible, allowing pt to do her work as much as possible.   08/31/24: SLP  discussed results of CLQT with pt and husband - pt's husband with questions about this. When asked, pt stated she was not at baseline and, specifically, I still have trouble remembering things - but that was going on before. SLP reviewed with pt that we are concerned about changes after CVA. Husband stated he thinks pt is at approx 80% of baseline. SLP had pt check her homework as she skipped 3 entries on one handout and did not correct her errors on the other (code from the last letters of the word). Pt req'd assistance with alternating attention with both tasks as she lost her place, and found it again 50% of the time (incr'd to 100% with mod A from SLP).  08/28/24: CLQT today. Even though pt scored WNL, SLP has noted some s/sx of mild attention deficits, which husband suspects is different from premorbid status. Pt had difficulty with a simple task targeting alternating attention, which is parallel with Steve's comment in s. When pt was asked she did not explain away deficits but did not endorse either. SLP suspects this could be pt's personality as she playfully stated self-deprecating or self-critical comments during the evaluation such as Well I guess I'm not as smart as you two. SLP to keep goals for awareness until ensured pt's awareness is WFL/WNL. Pt and husband both endorse changes in pt's short term memory. SLP to cont to focus on attention and memory in ST. Pt agrees.    08/22/24: Pt needs CLQT and PROM next visit. Given pt's cont'd complaints of memory difficulties, SLP reviewed memory strategies with pt and husband. Pt mentioned specific difficulty remembering people's names. SLP and pt/husband collaborated on developing strategy/ies for memory taking AM meds. SLP educated pt and husband about tasks pt could do at home to work with pt's  cognitive deficits. Homework for attention and details.   07/27/24: SLP discussed results of pt's HVLT, explained rationale for further assessment, provided memory strategies which will need to be reviewed at first visit, explained some home tasks, and provided information re: memory and attention and interplay between them.  PATIENT EDUCATION: Education details: see treatment date Person educated: Patient and Spouse Education method: Explanation, Demonstration, and Handouts Education comprehension: verbalized understanding, returned demonstration, and needs further education   GOALS: Goals reviewed with patient? No  SHORT TERM GOALS: Target date:  09/21/24 (due to visit number)  Pt will undergo further cognitive testing Baseline: Goal status: met  2.  Pt will utilize at least 2 memory strategies successfully, with cues to use them, between two sessions Baseline: 09/07/24 Goal status: met  3.  Pt will demo knowledge of at least 3 deficit areas in order to provide appropriate compensations, in two sessions Baseline:  Goal status: deferred (memory only)  4.  Pt will demo attention skills appropriate for min complex cognitive linguistic task in min-mod noisy environment for 8 minutes Baseline:  Goal status: met (per report)   LONG TERM GOALS: Target date:  11/23/24  Pt will improve PROM Baseline:  Goal status: INITIAL  2.  Pt will demo attention skills appropriate for alternating between min complex cognitive linguistic tasks in min noisy environment for 10 minutes, in 2 sessions Baseline:  Goal status: deferred - pt attention skills are at baseline  3.  Pt will demo ability to perform 3 household tasks with mod I (compensations), between 3 sessions Baseline: 10/05/24, 10/18/24 Goal status: INITIAL  4.  Pt will functionally solve practical problems during cognitive linguistic tasks in 3 ST  sessions Baseline:  Goal status: deferred - pt problem solving skills are at  baseline   ASSESSMENT:  CLINICAL IMPRESSION: Patient is a 78 y.o. F who was seen today for treatment of cognitive linguistic skills from a lt ICH suffered on 06/19/24. See treatment date above for today's date for further details on today's session. Pt and husband agree pt's skills are primarily back to baseline, except memory. During the evaluation she demonstrated deficits in memory, attention, awareness, and problem solving.    OBJECTIVE IMPAIRMENTS: include attention, memory, awareness, and executive functioning. These impairments are limiting patient from managing medications, managing appointments, managing finances, household responsibilities, ADLs/IADLs, and effectively communicating at home and in community. Factors affecting potential to achieve goals and functional outcome are ability to learn/carryover information and severity of impairments.. Patient will benefit from skilled SLP services to address above impairments and improve overall function.  REHAB POTENTIAL: Good  PLAN:  SLP FREQUENCY: 1-2x/week  SLP DURATION: 4 weeks additional  PLANNED INTERVENTIONS: Environmental controls, Cueing hierachy, Cognitive reorganization, Internal/external aids, Functional tasks, Multimodal communication approach, SLP instruction and feedback, Compensatory strategies, Patient/family education, and [id number] Treatment of speech (30 or 45 min)     Felishia Wartman, CCC-SLP 12/10/2024, 4:28 PM

## 2024-12-11 ENCOUNTER — Ambulatory Visit: Attending: Student in an Organized Health Care Education/Training Program

## 2024-12-17 ENCOUNTER — Telehealth: Payer: Self-pay | Admitting: Neurology

## 2024-12-17 NOTE — Telephone Encounter (Signed)
 Pt's spouse states the physical therapist told him and pt should would like pt to continue PT and to be seen once every two weeks, spouse is asking for a call to discuss this request of the PT for patient.

## 2024-12-24 ENCOUNTER — Other Ambulatory Visit (HOSPITAL_COMMUNITY): Payer: Self-pay | Admitting: Cardiology

## 2024-12-29 ENCOUNTER — Other Ambulatory Visit (HOSPITAL_COMMUNITY): Payer: Self-pay | Admitting: Cardiology

## 2025-01-02 NOTE — Telephone Encounter (Signed)
 Spouse has returned call to [facility], he is asking for a call back at [phone]

## 2025-01-02 NOTE — Telephone Encounter (Addendum)
 Called pt's husband back and he gave me the number to the Physical Therapist the pt is using for her PT.   [phone] Scheduling [phone] Office  LVM on [phone]

## 2025-01-02 NOTE — Telephone Encounter (Signed)
 LVM for husband to call back to give VO for pt's PT.

## 2025-01-07 NOTE — Telephone Encounter (Signed)
 Chelsea Barnett(Physical Therapist office [facility]) has returned call to CMA

## 2025-01-09 NOTE — Telephone Encounter (Signed)
 Lvm 2nd attempt by hf on [phone]

## 2025-01-28 ENCOUNTER — Ambulatory Visit: Admitting: Neurology

## 2025-01-30 ENCOUNTER — Telehealth: Payer: Self-pay | Admitting: Neurology

## 2025-01-30 NOTE — Telephone Encounter (Signed)
 Spouse is asking for a call from RN to discuss pt's 01/28/25 appointment being cx.  Phone rep explained it was due to the inclement weather as to why no one was seen on that date,phone rep explained that currently Dr Rosemarie has nothing available before Jan' 27 with the wait list, spouse was reminded that pt has her 1 yr f/u scheduled from last year.  Spouse is asking that RN calls him to discuss the need of pt being seen before October of this year, he declined appointment being placed on wait list.

## 2025-02-08 ENCOUNTER — Ambulatory Visit (HOSPITAL_COMMUNITY): Admitting: Cardiology

## 2025-10-22 ENCOUNTER — Ambulatory Visit: Admitting: Neurology
